# Patient Record
Sex: Female | Born: 1945 | Race: White | Hispanic: No | Smoking: Former smoker
Health system: Southern US, Community
[De-identification: ages and names within clinical notes are randomized; demographics above are authoritative.]

## PROBLEM LIST (undated history)

## (undated) DIAGNOSIS — R9439 Abnormal result of other cardiovascular function study: Secondary | ICD-10-CM

## (undated) DIAGNOSIS — F430 Acute stress reaction: Secondary | ICD-10-CM

## (undated) DIAGNOSIS — R112 Nausea with vomiting, unspecified: Secondary | ICD-10-CM

## (undated) DIAGNOSIS — E669 Obesity, unspecified: Secondary | ICD-10-CM

## (undated) DIAGNOSIS — R51 Headache: Secondary | ICD-10-CM

## (undated) DIAGNOSIS — M810 Age-related osteoporosis without current pathological fracture: Secondary | ICD-10-CM

## (undated) DIAGNOSIS — G43909 Migraine, unspecified, not intractable, without status migrainosus: Secondary | ICD-10-CM

## (undated) DIAGNOSIS — I209 Angina pectoris, unspecified: Secondary | ICD-10-CM

## (undated) DIAGNOSIS — E039 Hypothyroidism, unspecified: Secondary | ICD-10-CM

## (undated) DIAGNOSIS — N8110 Cystocele, unspecified: Secondary | ICD-10-CM

## (undated) DIAGNOSIS — I739 Peripheral vascular disease, unspecified: Secondary | ICD-10-CM

## (undated) DIAGNOSIS — I259 Chronic ischemic heart disease, unspecified: Secondary | ICD-10-CM

## (undated) DIAGNOSIS — D649 Anemia, unspecified: Secondary | ICD-10-CM

## (undated) DIAGNOSIS — J189 Pneumonia, unspecified organism: Secondary | ICD-10-CM

## (undated) DIAGNOSIS — T7840XA Allergy, unspecified, initial encounter: Secondary | ICD-10-CM

## (undated) DIAGNOSIS — F410 Panic disorder [episodic paroxysmal anxiety] without agoraphobia: Secondary | ICD-10-CM

## (undated) DIAGNOSIS — F32A Depression, unspecified: Secondary | ICD-10-CM

## (undated) DIAGNOSIS — M5126 Other intervertebral disc displacement, lumbar region: Secondary | ICD-10-CM

## (undated) DIAGNOSIS — I2109 ST elevation (STEMI) myocardial infarction involving other coronary artery of anterior wall: Secondary | ICD-10-CM

## (undated) DIAGNOSIS — E785 Hyperlipidemia, unspecified: Secondary | ICD-10-CM

## (undated) DIAGNOSIS — J449 Chronic obstructive pulmonary disease, unspecified: Secondary | ICD-10-CM

## (undated) DIAGNOSIS — Z8739 Personal history of other diseases of the musculoskeletal system and connective tissue: Secondary | ICD-10-CM

## (undated) DIAGNOSIS — E119 Type 2 diabetes mellitus without complications: Secondary | ICD-10-CM

## (undated) DIAGNOSIS — I2510 Atherosclerotic heart disease of native coronary artery without angina pectoris: Secondary | ICD-10-CM

## (undated) DIAGNOSIS — N200 Calculus of kidney: Secondary | ICD-10-CM

## (undated) DIAGNOSIS — N3941 Urge incontinence: Secondary | ICD-10-CM

## (undated) DIAGNOSIS — Z8639 Personal history of other endocrine, nutritional and metabolic disease: Secondary | ICD-10-CM

## (undated) DIAGNOSIS — I10 Essential (primary) hypertension: Secondary | ICD-10-CM

## (undated) DIAGNOSIS — Z9289 Personal history of other medical treatment: Secondary | ICD-10-CM

## (undated) DIAGNOSIS — R519 Headache, unspecified: Secondary | ICD-10-CM

## (undated) DIAGNOSIS — M1990 Unspecified osteoarthritis, unspecified site: Secondary | ICD-10-CM

## (undated) DIAGNOSIS — Z9889 Other specified postprocedural states: Secondary | ICD-10-CM

## (undated) DIAGNOSIS — F419 Anxiety disorder, unspecified: Secondary | ICD-10-CM

## (undated) DIAGNOSIS — H269 Unspecified cataract: Secondary | ICD-10-CM

## (undated) DIAGNOSIS — K219 Gastro-esophageal reflux disease without esophagitis: Secondary | ICD-10-CM

## (undated) DIAGNOSIS — F329 Major depressive disorder, single episode, unspecified: Secondary | ICD-10-CM

## (undated) DIAGNOSIS — Z8489 Family history of other specified conditions: Secondary | ICD-10-CM

## (undated) DIAGNOSIS — F439 Reaction to severe stress, unspecified: Secondary | ICD-10-CM

## (undated) DIAGNOSIS — M797 Fibromyalgia: Secondary | ICD-10-CM

## (undated) DIAGNOSIS — N201 Calculus of ureter: Secondary | ICD-10-CM

## (undated) HISTORY — DX: Age-related osteoporosis without current pathological fracture: M81.0

## (undated) HISTORY — DX: Major depressive disorder, single episode, unspecified: F32.9

## (undated) HISTORY — DX: Calculus of kidney: N20.0

## (undated) HISTORY — DX: Other intervertebral disc displacement, lumbar region: M51.26

## (undated) HISTORY — PX: DILATION AND CURETTAGE OF UTERUS: SHX78

## (undated) HISTORY — DX: Allergy, unspecified, initial encounter: T78.40XA

## (undated) HISTORY — DX: Hypothyroidism, unspecified: E03.9

## (undated) HISTORY — DX: Anemia, unspecified: D64.9

## (undated) HISTORY — PX: CARDIAC CATHETERIZATION: SHX172

## (undated) HISTORY — DX: Unspecified cataract: H26.9

## (undated) HISTORY — PX: FACIAL COSMETIC SURGERY: SHX629

## (undated) HISTORY — DX: Obesity, unspecified: E66.9

## (undated) HISTORY — DX: Anxiety disorder, unspecified: F41.9

## (undated) HISTORY — DX: Depression, unspecified: F32.A

## (undated) HISTORY — DX: Chronic ischemic heart disease, unspecified: I25.9

## (undated) HISTORY — DX: Reaction to severe stress, unspecified: F43.9

## (undated) HISTORY — DX: Hyperlipidemia, unspecified: E78.5

## (undated) HISTORY — PX: OTHER SURGICAL HISTORY: SHX169

---
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
## 1982-12-28 HISTORY — PX: CORONARY ANGIOPLASTY: SHX604

## 1983-02-26 DIAGNOSIS — I2109 ST elevation (STEMI) myocardial infarction involving other coronary artery of anterior wall: Secondary | ICD-10-CM

## 1983-02-26 HISTORY — DX: ST elevation (STEMI) myocardial infarction involving other coronary artery of anterior wall: I21.09

## 1998-07-24 ENCOUNTER — Ambulatory Visit (HOSPITAL_COMMUNITY): Admission: RE | Admit: 1998-07-24 | Discharge: 1998-07-24 | Payer: Self-pay | Admitting: Cardiology

## 1998-08-02 ENCOUNTER — Ambulatory Visit (HOSPITAL_COMMUNITY): Admission: RE | Admit: 1998-08-02 | Discharge: 1998-08-02 | Payer: Self-pay | Admitting: Cardiology

## 1999-12-04 ENCOUNTER — Encounter: Payer: Self-pay | Admitting: Cardiology

## 1999-12-04 ENCOUNTER — Encounter: Admission: RE | Admit: 1999-12-04 | Discharge: 1999-12-04 | Payer: Self-pay | Admitting: Cardiology

## 2001-01-19 ENCOUNTER — Encounter: Payer: Self-pay | Admitting: Obstetrics and Gynecology

## 2001-01-19 ENCOUNTER — Encounter: Admission: RE | Admit: 2001-01-19 | Discharge: 2001-01-19 | Payer: Self-pay | Admitting: Obstetrics and Gynecology

## 2001-02-01 ENCOUNTER — Other Ambulatory Visit: Admission: RE | Admit: 2001-02-01 | Discharge: 2001-02-01 | Payer: Self-pay | Admitting: Obstetrics and Gynecology

## 2001-03-09 ENCOUNTER — Encounter (INDEPENDENT_AMBULATORY_CARE_PROVIDER_SITE_OTHER): Payer: Self-pay | Admitting: Specialist

## 2001-03-09 ENCOUNTER — Other Ambulatory Visit: Admission: RE | Admit: 2001-03-09 | Discharge: 2001-03-09 | Payer: Self-pay | Admitting: Internal Medicine

## 2001-10-17 ENCOUNTER — Encounter: Payer: Self-pay | Admitting: Cardiology

## 2001-10-17 ENCOUNTER — Encounter: Admission: RE | Admit: 2001-10-17 | Discharge: 2001-10-17 | Payer: Self-pay | Admitting: Cardiology

## 2002-01-30 ENCOUNTER — Encounter: Payer: Self-pay | Admitting: Obstetrics and Gynecology

## 2002-01-30 ENCOUNTER — Encounter: Admission: RE | Admit: 2002-01-30 | Discharge: 2002-01-30 | Payer: Self-pay | Admitting: Obstetrics and Gynecology

## 2002-03-06 ENCOUNTER — Other Ambulatory Visit: Admission: RE | Admit: 2002-03-06 | Discharge: 2002-03-06 | Payer: Self-pay | Admitting: Obstetrics and Gynecology

## 2003-02-06 ENCOUNTER — Encounter: Payer: Self-pay | Admitting: Obstetrics and Gynecology

## 2003-02-06 ENCOUNTER — Encounter: Admission: RE | Admit: 2003-02-06 | Discharge: 2003-02-06 | Payer: Self-pay | Admitting: Obstetrics and Gynecology

## 2003-04-02 ENCOUNTER — Other Ambulatory Visit: Admission: RE | Admit: 2003-04-02 | Discharge: 2003-04-02 | Payer: Self-pay | Admitting: Obstetrics and Gynecology

## 2003-12-29 HISTORY — PX: CARPAL TUNNEL RELEASE: SHX101

## 2004-03-24 ENCOUNTER — Encounter: Admission: RE | Admit: 2004-03-24 | Discharge: 2004-03-24 | Payer: Self-pay | Admitting: Obstetrics and Gynecology

## 2004-04-21 ENCOUNTER — Other Ambulatory Visit: Admission: RE | Admit: 2004-04-21 | Discharge: 2004-04-21 | Payer: Self-pay | Admitting: Obstetrics and Gynecology

## 2004-04-22 ENCOUNTER — Encounter: Admission: RE | Admit: 2004-04-22 | Discharge: 2004-04-22 | Payer: Self-pay | Admitting: Obstetrics and Gynecology

## 2004-05-07 ENCOUNTER — Inpatient Hospital Stay (HOSPITAL_COMMUNITY): Admission: EM | Admit: 2004-05-07 | Discharge: 2004-05-08 | Payer: Self-pay | Admitting: Emergency Medicine

## 2005-05-28 ENCOUNTER — Ambulatory Visit (HOSPITAL_COMMUNITY): Admission: RE | Admit: 2005-05-28 | Discharge: 2005-05-28 | Payer: Self-pay | Admitting: Urology

## 2005-07-27 ENCOUNTER — Ambulatory Visit (HOSPITAL_COMMUNITY): Admission: RE | Admit: 2005-07-27 | Discharge: 2005-07-27 | Payer: Self-pay | Admitting: Urology

## 2005-11-20 ENCOUNTER — Encounter: Admission: RE | Admit: 2005-11-20 | Discharge: 2005-11-20 | Payer: Self-pay | Admitting: Obstetrics and Gynecology

## 2005-12-28 HISTORY — PX: EXTRACORPOREAL SHOCK WAVE LITHOTRIPSY: SHX1557

## 2006-05-17 ENCOUNTER — Ambulatory Visit: Payer: Self-pay | Admitting: Internal Medicine

## 2006-05-21 ENCOUNTER — Other Ambulatory Visit: Admission: RE | Admit: 2006-05-21 | Discharge: 2006-05-21 | Payer: Self-pay | Admitting: Obstetrics and Gynecology

## 2006-06-04 ENCOUNTER — Encounter: Admission: RE | Admit: 2006-06-04 | Discharge: 2006-06-04 | Payer: Self-pay | Admitting: Obstetrics and Gynecology

## 2006-06-23 ENCOUNTER — Ambulatory Visit: Payer: Self-pay | Admitting: Internal Medicine

## 2006-11-23 ENCOUNTER — Encounter: Admission: RE | Admit: 2006-11-23 | Discharge: 2006-11-23 | Payer: Self-pay | Admitting: Obstetrics and Gynecology

## 2007-01-17 ENCOUNTER — Encounter: Admission: RE | Admit: 2007-01-17 | Discharge: 2007-01-17 | Payer: Self-pay | Admitting: Cardiology

## 2007-05-20 ENCOUNTER — Encounter: Admission: RE | Admit: 2007-05-20 | Discharge: 2007-05-20 | Payer: Self-pay | Admitting: Endocrinology

## 2007-05-20 DIAGNOSIS — Z9289 Personal history of other medical treatment: Secondary | ICD-10-CM

## 2007-05-20 HISTORY — DX: Personal history of other medical treatment: Z92.89

## 2007-11-15 ENCOUNTER — Emergency Department (HOSPITAL_COMMUNITY): Admission: EM | Admit: 2007-11-15 | Discharge: 2007-11-16 | Payer: Self-pay | Admitting: Emergency Medicine

## 2007-11-17 ENCOUNTER — Ambulatory Visit (HOSPITAL_COMMUNITY): Admission: RE | Admit: 2007-11-17 | Discharge: 2007-11-17 | Payer: Self-pay | Admitting: Urology

## 2007-12-08 ENCOUNTER — Encounter: Admission: RE | Admit: 2007-12-08 | Discharge: 2007-12-08 | Payer: Self-pay | Admitting: Obstetrics and Gynecology

## 2008-02-09 ENCOUNTER — Encounter: Admission: RE | Admit: 2008-02-09 | Discharge: 2008-02-09 | Payer: Self-pay | Admitting: Internal Medicine

## 2008-04-18 ENCOUNTER — Inpatient Hospital Stay (HOSPITAL_BASED_OUTPATIENT_CLINIC_OR_DEPARTMENT_OTHER): Admission: RE | Admit: 2008-04-18 | Discharge: 2008-04-18 | Payer: Self-pay | Admitting: Cardiology

## 2008-06-30 ENCOUNTER — Emergency Department (HOSPITAL_COMMUNITY): Admission: EM | Admit: 2008-06-30 | Discharge: 2008-07-01 | Payer: Self-pay | Admitting: Emergency Medicine

## 2008-07-04 ENCOUNTER — Ambulatory Visit (HOSPITAL_BASED_OUTPATIENT_CLINIC_OR_DEPARTMENT_OTHER): Admission: RE | Admit: 2008-07-04 | Discharge: 2008-07-04 | Payer: Self-pay | Admitting: Urology

## 2008-07-04 HISTORY — PX: OTHER SURGICAL HISTORY: SHX169

## 2008-08-24 ENCOUNTER — Encounter: Admission: RE | Admit: 2008-08-24 | Discharge: 2008-08-24 | Payer: Self-pay | Admitting: Obstetrics and Gynecology

## 2008-12-12 ENCOUNTER — Inpatient Hospital Stay (HOSPITAL_COMMUNITY): Admission: AD | Admit: 2008-12-12 | Discharge: 2008-12-13 | Payer: Self-pay | Admitting: Cardiology

## 2009-02-13 ENCOUNTER — Encounter: Admission: RE | Admit: 2009-02-13 | Discharge: 2009-02-13 | Payer: Self-pay | Admitting: Obstetrics and Gynecology

## 2009-12-05 ENCOUNTER — Encounter: Admission: RE | Admit: 2009-12-05 | Discharge: 2009-12-05 | Payer: Self-pay | Admitting: Internal Medicine

## 2009-12-16 ENCOUNTER — Ambulatory Visit (HOSPITAL_COMMUNITY): Admission: RE | Admit: 2009-12-16 | Discharge: 2009-12-16 | Payer: Self-pay | Admitting: Urology

## 2010-01-01 IMAGING — CR DG CHEST 2V
2 series · 2 of 2 positions shown · non-contrast
Comparison: 07/24/2005

CLINICAL DATA: Unstable angina

CHEST - 2 VIEW

[w chest pa]
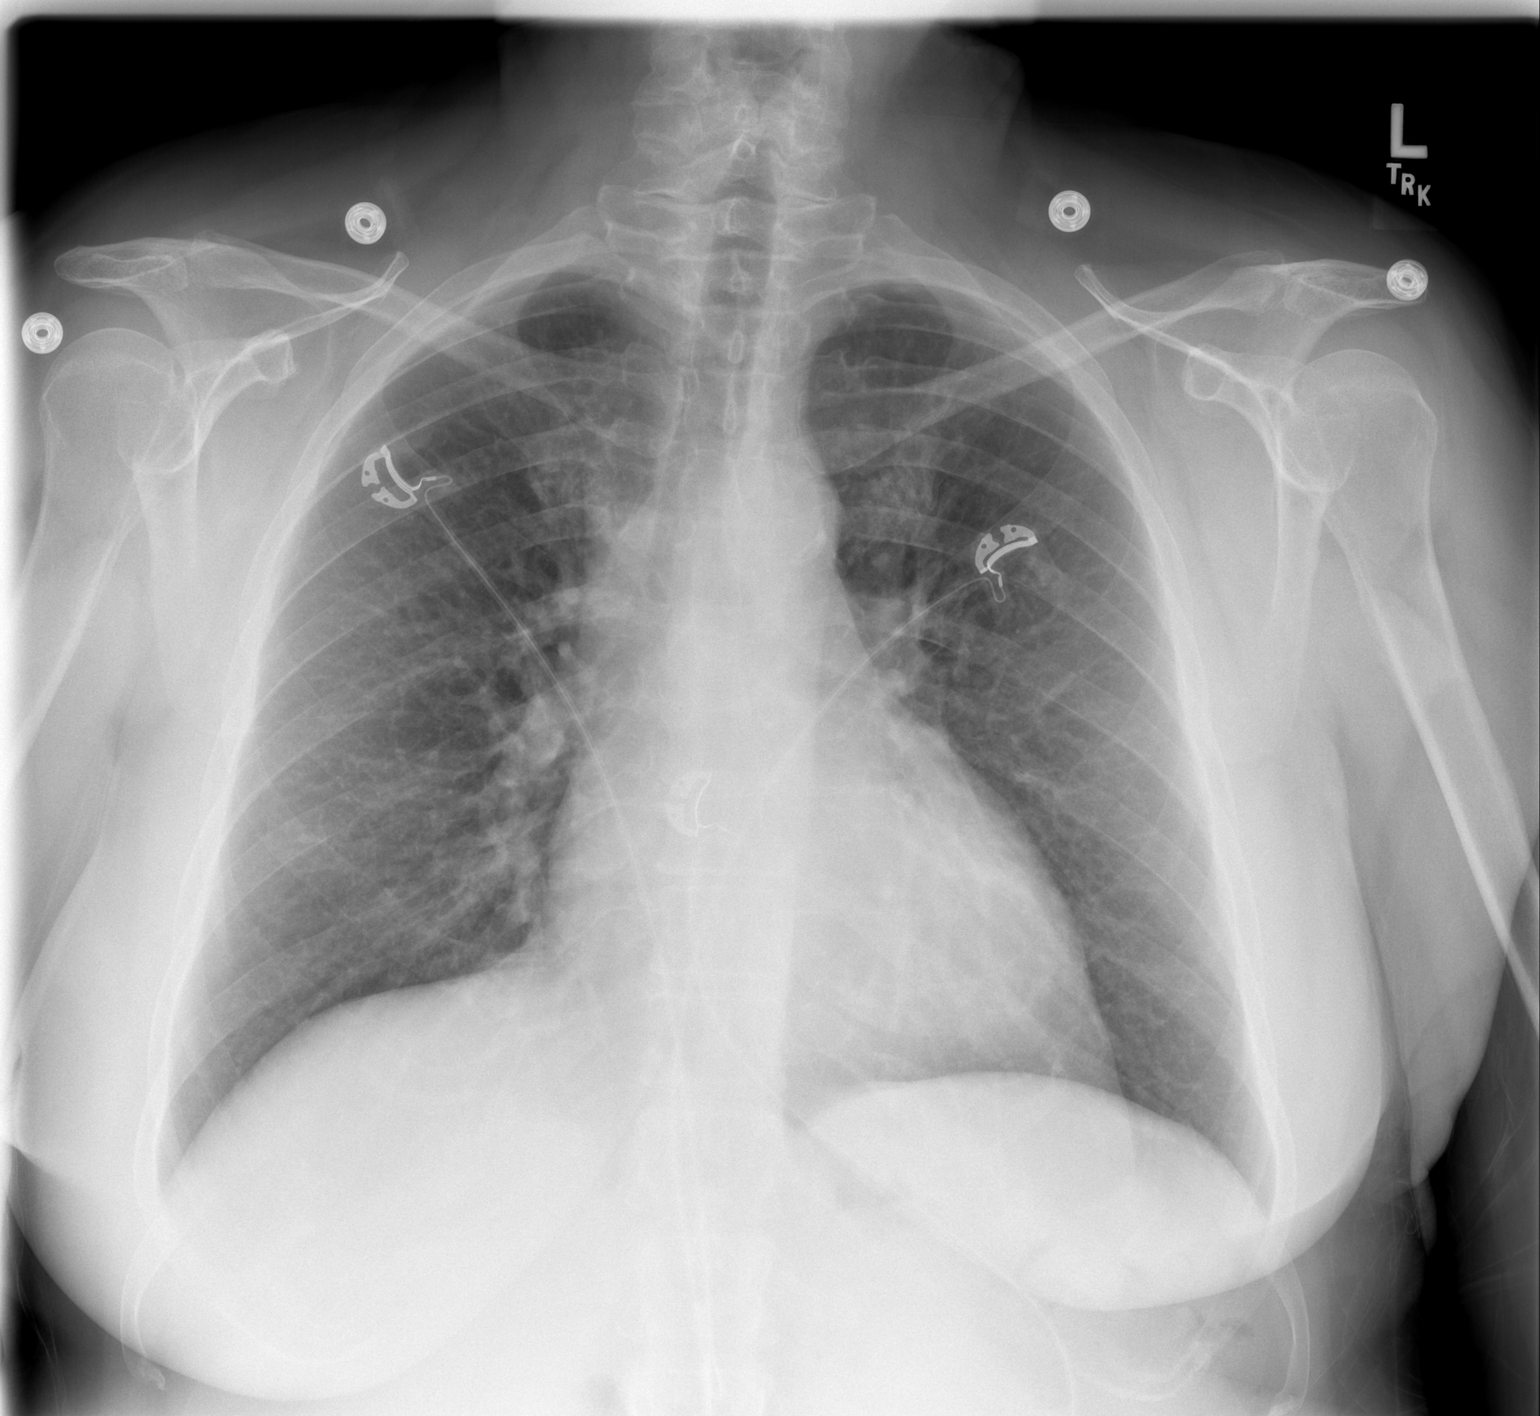

[w chest lat]
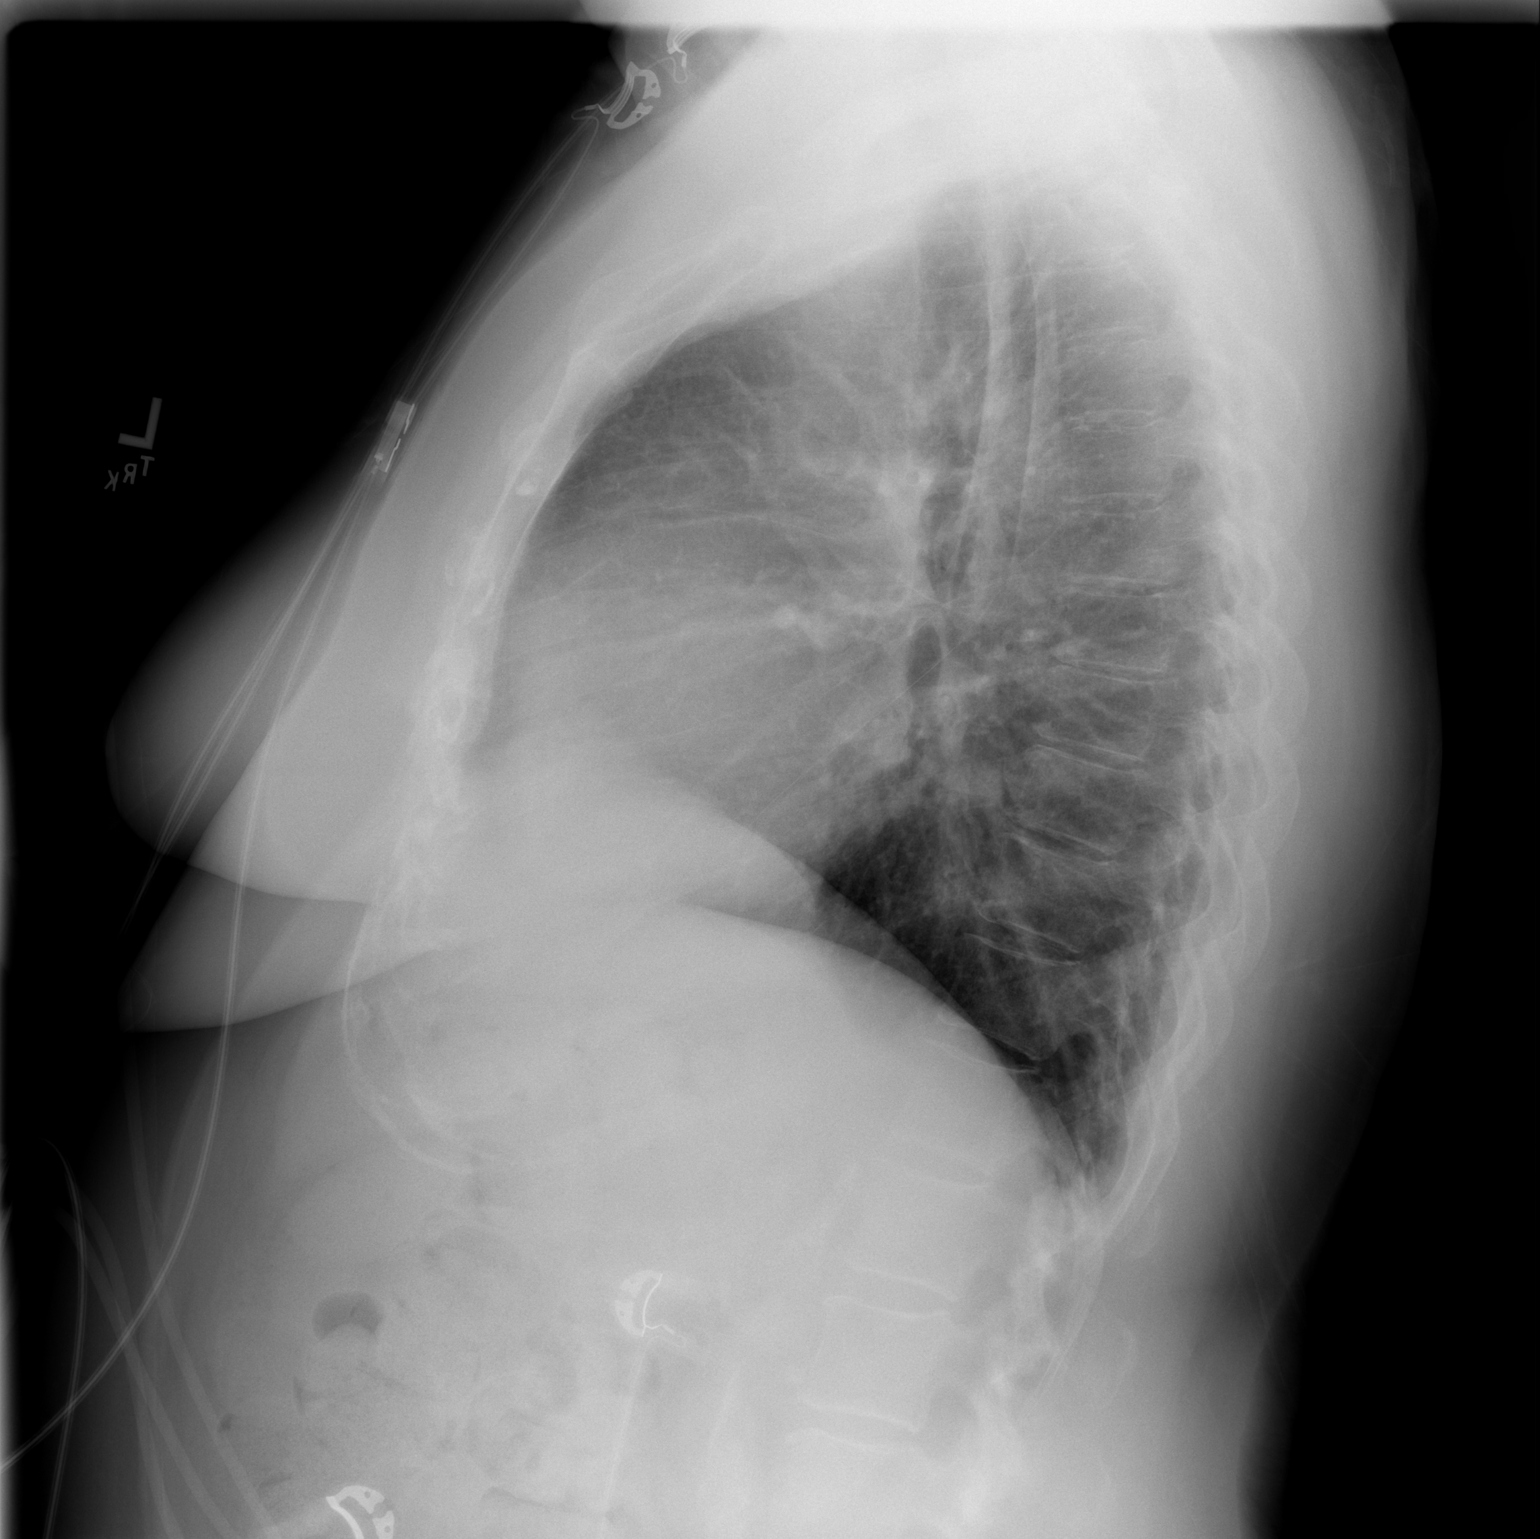

[2 of 2 positions shown; findings below may reference images not displayed]

FINDINGS: Heart size upper normal.  No congestive heart failure or
active disease.  Mild peribronchial thickening with slightly
coarsened markings as before.

Osseous structures intact.  No pleural fluid.
IMPRESSION: 1.  Heart size upper normal.
2.  Bronchitic changes.
3.  No congestive heart failure or active disease.

## 2010-03-21 ENCOUNTER — Encounter: Admission: RE | Admit: 2010-03-21 | Discharge: 2010-03-21 | Payer: Self-pay | Admitting: Obstetrics and Gynecology

## 2010-05-24 ENCOUNTER — Emergency Department (HOSPITAL_COMMUNITY): Admission: EM | Admit: 2010-05-24 | Discharge: 2010-05-24 | Payer: Self-pay | Admitting: Emergency Medicine

## 2010-12-08 ENCOUNTER — Ambulatory Visit: Payer: Self-pay | Admitting: Cardiology

## 2011-01-09 ENCOUNTER — Encounter
Admission: RE | Admit: 2011-01-09 | Discharge: 2011-01-09 | Payer: Self-pay | Source: Home / Self Care | Attending: Neurological Surgery | Admitting: Neurological Surgery

## 2011-01-13 ENCOUNTER — Ambulatory Visit: Payer: Self-pay | Admitting: Cardiology

## 2011-01-19 ENCOUNTER — Other Ambulatory Visit: Payer: Self-pay | Admitting: Obstetrics and Gynecology

## 2011-01-19 DIAGNOSIS — Z1239 Encounter for other screening for malignant neoplasm of breast: Secondary | ICD-10-CM

## 2011-03-09 ENCOUNTER — Encounter: Payer: Self-pay | Admitting: Cardiology

## 2011-03-09 DIAGNOSIS — I252 Old myocardial infarction: Secondary | ICD-10-CM | POA: Insufficient documentation

## 2011-03-09 DIAGNOSIS — F419 Anxiety disorder, unspecified: Secondary | ICD-10-CM | POA: Insufficient documentation

## 2011-03-09 DIAGNOSIS — E669 Obesity, unspecified: Secondary | ICD-10-CM | POA: Insufficient documentation

## 2011-03-09 DIAGNOSIS — E039 Hypothyroidism, unspecified: Secondary | ICD-10-CM | POA: Insufficient documentation

## 2011-03-09 DIAGNOSIS — I259 Chronic ischemic heart disease, unspecified: Secondary | ICD-10-CM | POA: Insufficient documentation

## 2011-03-09 DIAGNOSIS — M5126 Other intervertebral disc displacement, lumbar region: Secondary | ICD-10-CM | POA: Insufficient documentation

## 2011-03-09 DIAGNOSIS — F439 Reaction to severe stress, unspecified: Secondary | ICD-10-CM | POA: Insufficient documentation

## 2011-03-09 DIAGNOSIS — N200 Calculus of kidney: Secondary | ICD-10-CM | POA: Insufficient documentation

## 2011-03-09 DIAGNOSIS — Z8709 Personal history of other diseases of the respiratory system: Secondary | ICD-10-CM | POA: Insufficient documentation

## 2011-03-09 DIAGNOSIS — Z8701 Personal history of pneumonia (recurrent): Secondary | ICD-10-CM | POA: Insufficient documentation

## 2011-03-09 DIAGNOSIS — F329 Major depressive disorder, single episode, unspecified: Secondary | ICD-10-CM | POA: Insufficient documentation

## 2011-03-09 DIAGNOSIS — E1142 Type 2 diabetes mellitus with diabetic polyneuropathy: Secondary | ICD-10-CM | POA: Insufficient documentation

## 2011-03-16 LAB — DIFFERENTIAL
Basophils Absolute: 0.1 10*3/uL (ref 0.0–0.1)
Basophils Relative: 1 % (ref 0–1)
Lymphocytes Relative: 30 % (ref 12–46)
Neutro Abs: 6.4 10*3/uL (ref 1.7–7.7)
Neutrophils Relative %: 57 % (ref 43–77)

## 2011-03-16 LAB — URINE MICROSCOPIC-ADD ON

## 2011-03-16 LAB — BASIC METABOLIC PANEL
CO2: 27 mEq/L (ref 19–32)
Calcium: 10.3 mg/dL (ref 8.4–10.5)
Creatinine, Ser: 1.14 mg/dL (ref 0.4–1.2)
GFR calc Af Amer: 58 mL/min — ABNORMAL LOW (ref >60)
GFR calc non Af Amer: 48 mL/min — ABNORMAL LOW (ref >60)
Glucose, Bld: 99 mg/dL (ref 70–99)
Sodium: 137 mEq/L (ref 135–145)

## 2011-03-16 LAB — URINALYSIS, ROUTINE W REFLEX MICROSCOPIC
Nitrite: POSITIVE — AB
Specific Gravity, Urine: 1.012 (ref 1.005–1.030)
Urobilinogen, UA: 2 mg/dL — ABNORMAL HIGH (ref 0.0–1.0)
pH: 5 (ref 5.0–8.0)

## 2011-03-16 LAB — CBC
Hemoglobin: 12.5 g/dL (ref 12.0–15.0)
MCHC: 33.3 g/dL (ref 30.0–36.0)
RBC: 4.16 MIL/uL (ref 3.87–5.11)
RDW: 13.4 % (ref 11.5–15.5)

## 2011-03-16 LAB — URINE CULTURE

## 2011-03-24 ENCOUNTER — Ambulatory Visit
Admission: RE | Admit: 2011-03-24 | Discharge: 2011-03-24 | Disposition: A | Discharge status: Home / Self Care | Payer: BC Managed Care – PPO | Source: Ambulatory Visit | Attending: Obstetrics and Gynecology | Admitting: Obstetrics and Gynecology

## 2011-03-24 DIAGNOSIS — Z1239 Encounter for other screening for malignant neoplasm of breast: Secondary | ICD-10-CM

## 2011-03-30 LAB — GLUCOSE, CAPILLARY
Glucose-Capillary: 104 mg/dL — ABNORMAL HIGH (ref 70–99)
Glucose-Capillary: 95 mg/dL (ref 70–99)

## 2011-03-30 LAB — COMPREHENSIVE METABOLIC PANEL
ALT: 23 U/L (ref 0–35)
AST: 20 U/L (ref 0–37)
Albumin: 3.8 g/dL (ref 3.5–5.2)
Alkaline Phosphatase: 91 U/L (ref 39–117)
BUN: 12 mg/dL (ref 6–23)
CO2: 28 mEq/L (ref 19–32)
Calcium: 10.3 mg/dL (ref 8.4–10.5)
Chloride: 102 mEq/L (ref 96–112)
Creatinine, Ser: 0.88 mg/dL (ref 0.4–1.2)
GFR calc Af Amer: >60 mL/min (ref >60)
GFR calc non Af Amer: >60 mL/min (ref >60)
Glucose, Bld: 115 mg/dL — ABNORMAL HIGH (ref 70–99)
Potassium: 4.4 mEq/L (ref 3.5–5.1)
Sodium: 138 mEq/L (ref 135–145)
Total Bilirubin: 0.2 mg/dL — ABNORMAL LOW (ref 0.3–1.2)
Total Protein: 7 g/dL (ref 6.0–8.3)

## 2011-03-31 ENCOUNTER — Encounter: Payer: Self-pay | Admitting: Cardiology

## 2011-03-31 ENCOUNTER — Ambulatory Visit (INDEPENDENT_AMBULATORY_CARE_PROVIDER_SITE_OTHER): Payer: Medicare Other | Admitting: Cardiology

## 2011-03-31 ENCOUNTER — Ambulatory Visit: Payer: Self-pay | Admitting: Cardiology

## 2011-03-31 VITALS — BP 110/70 | HR 76 | Wt 184.0 lb

## 2011-03-31 DIAGNOSIS — E785 Hyperlipidemia, unspecified: Secondary | ICD-10-CM

## 2011-03-31 DIAGNOSIS — I259 Chronic ischemic heart disease, unspecified: Secondary | ICD-10-CM

## 2011-03-31 DIAGNOSIS — E7800 Pure hypercholesterolemia, unspecified: Secondary | ICD-10-CM

## 2011-03-31 DIAGNOSIS — E669 Obesity, unspecified: Secondary | ICD-10-CM

## 2011-03-31 DIAGNOSIS — I2510 Atherosclerotic heart disease of native coronary artery without angina pectoris: Secondary | ICD-10-CM

## 2011-04-01 ENCOUNTER — Telehealth: Payer: Self-pay | Admitting: Cardiology

## 2011-04-01 ENCOUNTER — Encounter: Payer: Self-pay | Admitting: Cardiology

## 2011-04-01 LAB — HEPATIC FUNCTION PANEL
AST: 21 U/L (ref 0–37)
Total Bilirubin: 0.7 mg/dL (ref 0.3–1.2)

## 2011-04-01 LAB — BASIC METABOLIC PANEL
BUN: 16 mg/dL (ref 6–23)
Creatinine, Ser: 0.8 mg/dL (ref 0.4–1.2)
GFR: 79.67 mL/min (ref >60.00)
Potassium: 4.6 mEq/L (ref 3.5–5.1)

## 2011-04-01 LAB — LIPID PANEL
Cholesterol: 140 mg/dL (ref 0–200)
LDL Cholesterol: 81 mg/dL (ref 0–99)
Total CHOL/HDL Ratio: 3
VLDL: 18.6 mg/dL (ref 0.0–40.0)

## 2011-04-01 NOTE — Assessment & Plan Note (Signed)
She has lost 11 pounds with a combination of exercise and diet.

## 2011-04-01 NOTE — Assessment & Plan Note (Signed)
LC levels were 11.4 in January. We've referred to Dr. Clelia Croft for followup. She had a normal PTH in June of 2011 and he has been following this issue.

## 2011-04-01 NOTE — Assessment & Plan Note (Addendum)
She's continued to do well from a cardiac standpoint. 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 She does have chronic anxieties but overall, has done well for 28 years with her coronary disease. I will have her see Dr. Olga Millers in followup.her last stress Cardiolite study was in April 2009 and we will try to arrange for another study before I retire. He tried to do stress Myoview is every 2 years to monitor her risk stratification.

## 2011-04-01 NOTE — Telephone Encounter (Signed)
[phone] OPT 2  PEER TO PEER  BY April 6 4PM CENTRAL  NEEDS MD OR LORI  ACCULUMTIVE  EFFECT PATIENT EXPOSURE  PATIENT MsV

## 2011-04-01 NOTE — Assessment & Plan Note (Signed)
She sees Dr. Clelia Croft for primary care here cholesterol levels have been satisfactory

## 2011-04-01 NOTE — Progress Notes (Signed)
Subjective:   [name] [name] is seen today for a followup visit. 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 With thrombolytics and angioplasty. Continue to manage her over the years with recurrent episodes of chest pain and overall, she's been doing well for several years. Her last cardiac catheterization was in April of 2009 which showed mild anterior hypokinesis. She had mild coronary atherosclerosis with a 30-40% narrowing in the mid LAD and 50% narrowing in a small obtuse marginal. She had normal renal arteries. Her right coronary artery was nondominant. She is continued to do well.  Current Outpatient Prescriptions  Medication Sig Dispense Refill  . ALPRAZolam (XANAX) 0.5 MG tablet Take 1 mg by mouth. ONE TABLET IN AM, TWO TABLETS IN PM      . aspirin 81 MG tablet Take 81 mg by mouth daily.        Marland Kitchen atorvastatin (LIPITOR) 40 MG tablet Take 40 mg by mouth daily.        Marland Kitchen diltiazem (CARDIZEM CD) 120 MG 24 hr capsule Take 120 mg by mouth daily.        Marland Kitchen ezetimibe (ZETIA) 10 MG tablet Take 10 mg by mouth daily.        Marland Kitchen FLUoxetine (PROZAC) 20 MG capsule Take 20 mg by mouth daily.        . Fluticasone-Salmeterol (ADVAIR HFA IN) Inhale into the lungs as needed.        Marland Kitchen levothyroxine (SYNTHROID, LEVOTHROID) 150 MCG tablet Take 150 mcg by mouth daily.        Marland Kitchen losartan (COZAAR) 50 MG tablet Take 50 mg by mouth daily.        . metFORMIN (GLUCOPHAGE) 1000 MG tablet Take 1,000 mg by mouth 2 (two) times daily.        . metoprolol succinate (TOPROL-XL) 25 MG 24 hr tablet Take 25 mg by mouth daily.        . nitroGLYCERIN (NITROSTAT) 0.4 MG SL tablet Place 0.4 mg under the tongue every 5 (five) minutes as needed.        Marland Kitchen omeprazole (PRILOSEC) 20 MG capsule Take 20 mg by mouth as needed.        Marland Kitchen oxyCODONE-acetaminophen (PERCOCET) 5-325 MG per tablet Take 1.5 tablets by mouth 4 (four) times daily.          No Known Allergies  Patient Active Problem List  Diagnoses  . Ischemic heart  disease  . Diabetes mellitus  . Obesity  . Chronic anxiety  . Nephrolithiasis  . Lumbar herniated disc  . Depression  . Situational stress  . Hyperlipemia  . Hypothyroidism  . History of bronchitis  . History of pneumonia  . History of myocardial infarction    History  Smoking status  . Former Smoker  . Types: Cigarettes  . Quit date: 12/28/1982  Smokeless tobacco  . Never Used    History  Alcohol Use: Not on file    Family History  Problem Relation Age of Onset  . Heart disease Mother   . Heart attack Father   . Heart disease Brother     Review of Systems:   The patient denies any heat or cold intolerance.  No weight gain or weight loss.  The patient denies headaches or blurry vision.  There is no cough or sputum production.  The patient denies dizziness.  There is no hematuria or hematochezia.  The patient denies any muscle aches or arthritis.  The patient denies any rash.  The patient denies frequent falling or instability.  There is no history of depression or anxiety.  All other systems were reviewed and are negative.   Physical Exam:   Weight is 184 which is down 11 pounds. Blood pressure is 118/70 sitting, 110/70 standing, heart rate is 76.The head is normocephalic and atraumatic.  Pupils are equally round and reactive to light.  Sclerae nonicteric.  Conjunctiva is clear.  Oropharynx is unremarkable.  There's adequate oral airway.  Neck is supple there are no masses.  Thyroid is not enlarged.  There is no lymphadenopathy.  Lungs are clear.  Chest is symmetric.  Heart shows a regular rate and rhythm.  S1 and S2 are normal.  There is no murmur click or gallop.  Abdomen is soft normal bowel sounds.  There is no organomegaly.  Genital and rectal deferred.  Extremities are without edema.  Peripheral pulses are adequate.  Neurologically intact.  Full range of motion.  The patient is not depressed.  Skin is warm and dry.  Assessment / Plan:

## 2011-04-02 ENCOUNTER — Telehealth: Payer: Self-pay | Admitting: *Deleted

## 2011-04-02 NOTE — Telephone Encounter (Signed)
Pt notified of lab results and to continue same medications.  Pt will f/u with Dr. Jens Som in six months with labs.

## 2011-04-02 NOTE — Telephone Encounter (Signed)
Message copied by Barnetta Hammersmith on Thu Apr 02, 2011 10:14 AM ------      Message from: Norma Fredrickson      Created: Wed Apr 01, 2011  8:55 AM       Ok to report. Labs are satisfactory.  Stay on same medicines. Recheck BMET/HPF/LIPIDS  in 6 months.

## 2011-04-09 ENCOUNTER — Ambulatory Visit (HOSPITAL_COMMUNITY): Payer: Medicare Other | Attending: Cardiology | Admitting: Radiology

## 2011-04-09 VITALS — Ht 68.25 in | Wt 184.0 lb

## 2011-04-09 DIAGNOSIS — I4949 Other premature depolarization: Secondary | ICD-10-CM

## 2011-04-09 DIAGNOSIS — E7800 Pure hypercholesterolemia, unspecified: Secondary | ICD-10-CM | POA: Insufficient documentation

## 2011-04-09 DIAGNOSIS — R9439 Abnormal result of other cardiovascular function study: Secondary | ICD-10-CM

## 2011-04-09 DIAGNOSIS — R079 Chest pain, unspecified: Secondary | ICD-10-CM

## 2011-04-09 DIAGNOSIS — R0989 Other specified symptoms and signs involving the circulatory and respiratory systems: Secondary | ICD-10-CM

## 2011-04-09 HISTORY — DX: Abnormal result of other cardiovascular function study: R94.39

## 2011-04-09 MED ORDER — TECHNETIUM TC 99M TETROFOSMIN IV KIT
33.0000 | PACK | Freq: Once | INTRAVENOUS | Status: AC | PRN
  Administered 2011-04-09: 33 via INTRAVENOUS

## 2011-04-09 MED ORDER — TECHNETIUM TC 99M TETROFOSMIN IV KIT
11.0000 | PACK | Freq: Once | INTRAVENOUS | Status: AC | PRN
  Administered 2011-04-09: 11 via INTRAVENOUS

## 2011-04-09 MED ORDER — REGADENOSON 0.4 MG/5ML IV SOLN
0.4000 mg | Freq: Once | INTRAVENOUS | Status: AC
  Administered 2011-04-09: 0.4 mg via INTRAVENOUS

## 2011-04-09 NOTE — Progress Notes (Signed)
[facility] SITE 3 NUCLEAR MED [address] [place] [address] [phone]  Cardiology Nuclear Med Study  [name] [name] is a 65 y.o. female [id number] 1946-02-16   Nuclear Med Background Indication for Stress Test:  Evaluation for Ischemia History: '84 Angioplasty, 03/84 EF 50%Heart Catheterization, 04/09 Myocardial Infarction and 04/09 EF 61%Myocardial Perfusion Study Cardiac Risk Factors: Family History - CAD, History of Smoking, Lipids and NIDDM  Symptoms:  Chest Pain, DOE, Fatigue and Palpitations   Nuclear Pre-Procedure Caffeine/Decaff Intake:  None NPO After: 7:00am   Lungs:  clear IV 0.9% NS with Angio Cath:  18g  IV Site: R Antecubital  IV Started by:  Stanton Kidney, EMT-P  Chest Size (in):  40 Cup Size: C  Height: 5' 8.25" (1.734 m)  Weight:  184 lb (83.462 kg)  BMI:  Body mass index is 27.77 kg/(m^2). Tech Comments:  Toprol held > 15 hours, per patient. The patient was switched to a walking Lexiscan when she couldn't keep up with the treadmill.    Nuclear Med Study 1 or 2 day study: 1 day  Stress Test Type:  Treadmill/Lexiscan  Reading MD: [name], MD  Order Authorizing Provider:  S.Tennant  Resting Radionuclide: Technetium 80m Tetrofosmin  Resting Radionuclide Dose: 11.0 mCi   Stress Radionuclide:  Technetium 24m Tetrofosmin  Stress Radionuclide Dose: 33.0 mCi           Stress Protocol Rest HR: 62 Stress HR: 131  Rest BP: 127/74 Stress BP: 183/59  Exercise Time (min): 7:30 METS: 6.60   Predicted Max HR: 154 bpm % Max HR: 85.06 bpm Rate Pressure Product: 21308   Dose of Adenosine (mg):  n/a Dose of Lexiscan: 0.4 mg  Dose of Atropine (mg): n/a Dose of Dobutamine: n/a mcg/kg/min (at max HR)  Stress Test Technologist: Milana Na, EMT-P  Nuclear Technologist:  Doyne Keel, CNMT     Rest Procedure:  Myocardial perfusion imaging was performed at rest 45 minutes following the intravenous administration of Technetium 58m  Tetrofosmin. Rest ECG: Sinus Bradycardia  Stress Procedure:  The patient received IV Lexiscan 0.4 mg over 15-seconds with concurrent low level exercise and then Technetium 63m Tetrofosmin was injected at 30-seconds while the patient continued walking one more minute.  There were non specific changes and rare pvcs with Lexiscan.  Quantitative spect images were obtained after a 45-minute delay. Stress ECG: No significant change from baseline ECG  QPS Raw Data Images:  Normal; no motion artifact; normal heart/lung ratio. Stress Images:  Mid to apical anterior perfusion defect.  Rest Images:  Mid to apical anterior perfusion defect, more prominent than on stress images. Subtraction (SDS):  Mid to apical anterior perfusion defect, actually more pronounced at rest than with stress.  Transient Ischemic Dilatation (Normal <1.22):  1.07 Lung/Heart Ratio (Normal <0.45):  0.35  Quantitative Gated Spect Images QGS EDV:  79 ml QGS ESV:  29 ml QGS cine images:  The apex was hypokinetic.  QGS EF: 63%  Impression Exercise Capacity:  Lexiscan with low level exercise. BP Response:  Normal blood pressure response. Clinical Symptoms:  There is dyspnea. ECG Impression:  Artifact.  No significant ST changes.  Comparison with Prior Nuclear Study: No images to compare  Overall Impression:  Mid to apical anterior perfusion defect that is actually more pronounced at rest than with stress.  There is no ischemia.  Suspect this represents prior MI but pattern is unusual given worse defect at rest.  Apical hypokinesis but overall preserved  EF.   [name]

## 2011-04-10 ENCOUNTER — Telehealth: Payer: Self-pay | Admitting: Cardiology

## 2011-04-10 NOTE — Progress Notes (Signed)
ROUTED TO DR. TENNANT.[name] [name] ° °

## 2011-04-10 NOTE — Telephone Encounter (Signed)
Fax: [phone] Stress test, Office Note, EKG, Cath

## 2011-04-13 ENCOUNTER — Encounter: Payer: Self-pay | Admitting: Cardiology

## 2011-04-13 ENCOUNTER — Telehealth: Payer: Self-pay | Admitting: *Deleted

## 2011-04-13 NOTE — Telephone Encounter (Signed)
Pt calling regarding her stress test from last week;  Wanting the results called as soon as you get [phone]

## 2011-04-14 ENCOUNTER — Telehealth: Payer: Self-pay | Admitting: Cardiology

## 2011-04-14 NOTE — Telephone Encounter (Signed)
ASKING FOR LAST OV NOTE AND STRESS TEST WITH RESTING EKG TO BE FAXED TO: [phone]. PROCEDURE SCHEDULED FOR MAY 1.

## 2011-04-15 ENCOUNTER — Encounter: Payer: Self-pay | Admitting: Cardiology

## 2011-04-15 ENCOUNTER — Other Ambulatory Visit (INDEPENDENT_AMBULATORY_CARE_PROVIDER_SITE_OTHER): Payer: Medicare Other | Admitting: *Deleted

## 2011-04-15 DIAGNOSIS — Z79899 Other long term (current) drug therapy: Secondary | ICD-10-CM

## 2011-04-15 LAB — CBC WITH DIFFERENTIAL/PLATELET
Basophils Absolute: 0 10*3/uL (ref 0.0–0.1)
Eosinophils Absolute: 0.3 10*3/uL (ref 0.0–0.7)
HCT: 36.4 % (ref 36.0–46.0)
Hemoglobin: 12.2 g/dL (ref 12.0–15.0)
Lymphs Abs: 3 10*3/uL (ref 0.7–4.0)
MCHC: 33.7 g/dL (ref 30.0–36.0)
Monocytes Relative: 6.9 % (ref 3.0–12.0)
Neutro Abs: 6.4 10*3/uL (ref 1.4–7.7)
RDW: 14.4 % (ref 11.5–14.6)

## 2011-04-15 NOTE — Progress Notes (Signed)
[employer] Unchanged.

## 2011-04-15 NOTE — Progress Notes (Signed)
Pt notified of nuclear results.   

## 2011-04-20 ENCOUNTER — Telehealth: Payer: Self-pay | Admitting: *Deleted

## 2011-04-20 NOTE — Telephone Encounter (Signed)
Message copied by Barnetta Hammersmith on Mon Apr 20, 2011  8:59 AM ------      Message from: Roger Shelter      Created: Fri Apr 17, 2011  4:07 PM       Ok to report

## 2011-04-20 NOTE — Telephone Encounter (Signed)
Pt notified of lab results and a copy of lab's faxed to Dr. Derek Jack office.

## 2011-04-24 ENCOUNTER — Other Ambulatory Visit: Payer: Self-pay | Admitting: *Deleted

## 2011-04-24 NOTE — Telephone Encounter (Signed)
Pt sent RN fax regarding refills on meds.  RN called in to [facility] [address] for refills for one year on Fluoxetine 20 mg one po daily #30, Metoprolol Succinate 25 one po daily #30, Zetia 10 mg one po daily #30, Lipitor 40 mg one po daily #30, Omeprazole 20 mg one po daily #30, Diltiazem 120 mg one po daily #30, and Losartan 50 mg one po daily #30.

## 2011-04-30 ENCOUNTER — Other Ambulatory Visit: Payer: Self-pay | Admitting: Cardiology

## 2011-04-30 NOTE — Telephone Encounter (Signed)
Pt called She wants to get refill of all meds before Dr Cristobal Goldmann please call

## 2011-04-30 NOTE — Telephone Encounter (Signed)
RN called in Furosemide 20 mg one po daily PRN swelling #30 with 11 refills, Zetia 10 mg one po daily #30 with 11 refills, Losartan 25 mg one po daily #30 with 11 refills, Nitrostat 0.4 mg one tab SL PRN #25 with 11 refills, Xanax 0.5 mg one- two tablets po bid prn #100 with 5 refills per pt request.  Called into Pharmacist Melanie at [facility] [address].

## 2011-05-12 NOTE — Op Note (Signed)
NAME:  [name] [name], [name] [name]                ACCOUNT NO.:  [id number]   MEDICAL RECORD NO.:  [id number]          PATIENT TYPE:  AMB   LOCATION:  DAY                          FACILITY:  [facility]   PHYSICIAN:  Mark C. Vernie Ammons, M.D.  DATE OF BIRTH:  10-11-1946   DATE OF PROCEDURE:  11/17/2007  DATE OF DISCHARGE:                               OPERATIVE REPORT   PREOPERATIVE DIAGNOSIS:  Left ureteral calculi.   POSTOPERATIVE DIAGNOSIS:  Left ureteral calculi.   PROCEDURE:  Cystoscopy, left ureteroscopy, stone extraction and in situ  laser lithotripsy with a double-J stent placement.   SURGEON:  Mark C. Vernie Ammons, M.D.   ANESTHESIA:  General.   DRAINS:  5-French 26 cm Polaris stent (no string).   BLOOD LOSS:  Minimal.   COMPLICATIONS:  None.   INDICATIONS:  The patient is 65 year old white female with known  bilateral renal calculi who developed acute left flank pain and renal  colic.  She was seen in my office, treated and found on CT scan to have  what appeared to be two stones in the distal left ureter.  She had to go  to the emergency room a day later with severe flank pain and therefore  was brought to the operating room for ureteroscopic extraction of  stones.  We discussed the procedure, its risks, complications and  alternatives.  She understands and elected to proceed.   DESCRIPTION OF OPERATION:  After informed consent, the patient was  brought to the major OR, placed on table administered general anesthesia  and then moved to the dorsal lithotomy position.  Genitalia was  sterilely prepped and draped and official time-out was then performed.  Initially the 6-French rigid ureteroscope was introduced per urethra  under direct visualization into the bladder.  There were no tumors,  stones or inflammatory lesions seen within the bladder.  The left  orifice was visualized.  It was noted to be patulous and was widely  patent so I was easily able to pass the ureteroscope under direct  vision  up the left ureter.  I encountered a stone and photographed it.  I was  able to engage this with the nitinol basket and extracted it without  difficulty.   I then reinserted the ureteroscope in the left ureter and a second stone  was noted.  This was engaged in the nitinol basket but could not be  easily manipulated and rather than risk injury to the ureter.  I elected  to treat with laser lithotripsy.   The 360 micron holmium laser fiber was then passed through the  ureteroscope and the stone was fully fragmented.  The larger fragments  were then extracted with a nitinol basket and I then passed a 0.038 inch  floppy tip guidewire up the left ureter under direct fluoroscopic  control.  I then removed the ureteroscope, noting no injury to the  ureter or remaining fragments.   I back loaded the cystoscope over the guidewire and passed the Polaris  stent over the guidewire into the area of the renal pelvis.  Then the  guidewire was removed.  Good curl was noted in the renal pelvis.  The  stent was seen exiting the left ureter and the bladder was drained and  the cystoscope removed.  The patient tolerated procedure well.  There  were no intraoperative complications.  She will be given a prescription  for some Pyridium Plus and return to my office beginning of next week  for cystoscopic removal of her stent.      Mark C. Vernie Ammons, M.D.  Electronically Signed     MCO/MEDQ  D:  11/17/2007  T:  11/18/2007  Job:  [id number]

## 2011-05-12 NOTE — H&P (Signed)
NAME:  [name] [name]                ACCOUNT NO.:  [id number]   MEDICAL RECORD NO.:  [id number]           PATIENT TYPE:   LOCATION:                                 FACILITY:   PHYSICIAN:  Colleen Can. Deborah Chalk, M.D.DATE OF BIRTH:  1946-02-16   DATE OF ADMISSION:  12/12/2008  DATE OF DISCHARGE:                              HISTORY & PHYSICAL   CHIEF COMPLAINT:  Chest pain.   HISTORY OF THE PRESENT ILLNESS:  The patient is a 65 year old white  female who has a history of known atherosclerotic cardiovascular disease  with remote anterior MI in March 16, 1983 that was treated with  thrombolytics.  She had her last catheterization in April 2009.  She  presents to our office as a work-in appointment today on December 12, 2008 with complaints of angina started yesterday at about 11 o'clock  while she was cleaning her house.  She has soreness over the left breast  as well as going down arm.  She has taken nitroglycerin.  She has used  hydrocodone.  The discomfort sort of seems to be off and on.  She has  also had associated headache.  She has been under a significant amount  of situational stress in regards to church, family and home situation.  She has had chest pain here in our office and is now admitted for  further evaluation and management.   PAST MEDICAL HISTORY:  1. Atherosclerotic cardiovascular disease with a previous anterior MI      in March 1984 treated with thrombolytics.  Her last catheterization      was in April 2009, which showed anterior apical hypokinesia.      Ejection fraction was 45%.  There was a calcified proximal LAD with      well-preserved lumen and mild atherosclerotic disease elsewhere.      She was felt to best be managed with medically.  2. Remote angioplasty in 1984.  3. Anxiety and depression.  4. Hyperlipidemia.  5. Hyperthyroidism.  6. Diabetes.  7. Obesity.  8. History of bronchitis.  9. Past history of pneumonia.   ALLERGIES:  The patient reports  none; however, her medical record states  KEFLEX AND VICODIN.   MEDICATIONS:  Current medicines include:  1. Synthroid 150 mcg a day.  2. Cartia XT 240 a day.  3. Xanax 0.5 one in the morning and 2 at night.  4. Baby aspirin daily.  5. Lipitor 40 mg a day.  6. Lasix as needed.  7. Nitroglycerin as needed.  8. Prozac 10 mg a day.  9. Zetia 10 mg a day.  10.Actos 30 mg a day.  11.Generic Prilosec as needed.  12.Mucinex daily.  13.Xopenex as needed.  14.Advair as needed.   REVIEW OF SYSTEMS:  The patient has been under a significant amount of  situational stress.  She has had chest pain since yesterday.  She is  really not short of breath.  She has not really been exercising.  She  has had no recent fever, flu or cough; but, about 8 months ago she had a  significant bout of bronchitis.  She denies GI and GU symptoms; and, all  the other review of systems are negative.   SOCIAL HISTORY:  The patient is married.  She has no current alcohol or  tobacco history.   FAMILY HISTORY:  The family history is positive for coronary disease.   PHYSICAL EXAMINATION:  GENERAL APPEARANCE:  On exam she is anxious and  somewhat agitated.  She continues to complain of pain down the left arm.  VITAL SIGNS:  The patient's weight 213.5 pounds, blood pressure 152/90  and repeat is 160/90, and heart rate 67 and regular.  GENERAL:  In general she does follow commands.  HEENT:  Normocephalic and atraumatic.  NECK:  The neck is supple.  No JVD.  LUNGS:  The lungs are clear.  HEART:  Cardiac - exam shows a regular rhythm.  There is no murmur.  CHEST:  There is no chest wall tenderness.  ABDOMEN:  The abdomen is obese.  Positive bowel sounds.  Nontender.  EXTREMITIES:  The extremities are without edema.  NEUROLOGIC EXAMINATION:  Neurologically shows no gross focal deficits.   LABORATORY DATA:  The patient EKG shows sinus rhythm with an old  anterior myocardial infarction and nonspecific ST and T-wave  changes.  Her other labs are pending.   IMPRESSION:  The overall impression is:  1. Chest pain, questionable unstable angina.  2. History of remote history of myocardial infarction in 1984 treated      with thrombolytics and angioplasty.  3. Anxiety and depression.  4. Significant situational stress.  5. Diabetes.  6. Hyperthyroidism.  7. Hyperlipidemia.  8. Elevated blood pressure.   PLAN:  1. The patient will be admitted to the hospital overnight.  2. We will check serial enzymes.  3. We will place her on low-dose beta blocker therapy.  4. We will also start IV nitroglycerin and IV heparin.  5. The further treatment plan to follow will be per Dr. Ronnald Nian      discretion.      [name], N.P.      Colleen Can. Deborah Chalk, M.D.  Electronically Signed    LC/MEDQ  D:  12/12/2008  T:  12/12/2008  Job:  [id number]   cc:   Veverly Fells. Altheimer, M.D.

## 2011-05-12 NOTE — Discharge Summary (Signed)
NAME:  [name] [name], [name] [name]                ACCOUNT NO.:  [id number]   MEDICAL RECORD NO.:  [id number]          PATIENT TYPE:  INP   LOCATION:  4705                         FACILITY:  [facility]   PHYSICIAN:  Colleen Can. Deborah Chalk, M.D.DATE OF BIRTH:  [birth year]   DATE OF ADMISSION:  12/12/2008  DATE OF DISCHARGE:  12/13/2008                               DISCHARGE SUMMARY   DISCHARGE DIAGNOSES:  1. Chest pain with negative cardiac enzymes.  2. Known ischemic heart disease with remote anterior myocardial      infarction in March 1984, treated with thrombolytics and      angioplasty.  Last catheterization was in April 2009, which showed      anteroapical hypokinesia, ejection fraction of 45%, and a calcified      proximal left anterior descending.  She was felt to best be managed      medically.  3. Anxiety and depression.  4. Significant situational stress.  5. Hyperlipidemia.  6. Hypothyroidism.  7. Diabetes.  8. Obesity.  9. History of bronchitis and pneumonia.   HISTORY OF PRESENT ILLNESS:  The patient is a 65 year old white female  who has remote history of atherosclerotic cardiovascular disease with  remote anterior MI in March 1984, was treated with thrombolytics and  angioplasty.  Her last catheterization was earlier this year in April.  She has been managed medically since that time.  She presented to our  office with a work-in appointment on December 12, 2008 with complaints  of angina that had started the day before while she was cleaning her  house.  She had continued soreness over her left breast and radiation  down the arm.  She had taken some nitroglycerin and got very anxious.  Her discomfort continued to be intermittent.  She was seen in the office  and was complaining of discomfort at that time.  She has been under a  significant amount of situational stress in regards to church, family,  and home.  She was subsequently admitted for further evaluation.   Please see the history  and physical for further patient presentation and  profile.   LABORATORY DATA ON ADMISSION:  All of her cardiac enzymes were negative.  CBC was normal.  Chemistries were all normal.  Glucose was 99.  PT and  PTT were unremarkable.  LFTs were normal.  Calcium level was 10.  TSH  was 1.1.  BNP was 81.  Total cholesterol 127, LDL 71, HDL 44, and a  triglyceride level of 59.  Her EKG showed no acute changes.   HOSPITAL COURSE:  The patient was admitted from the office.  She was  placed on nitroglycerin and heparin.  She was also placed on round-the-  clock Xanax.  Her blood pressure had been noted to be elevated in the  office, but it subsequently came down quite nicely by the time she  arrived at [facility].  She was watched overnight.  She had negative  cardiac enzymes.  The following morning her nitroglycerin and heparin  were discontinued.  Her activity was increased and she was ambulated in  the  hall with no further discomfort and she was felt to be a  satisfactory candidate for discharge.  Most of her symptoms are felt to  be at this time due to significant amount of stress that she has been  under.   DISCHARGE CONDITION:  Stable.   DISCHARGE MEDICATIONS:  1. Synthroid 150 mcg a day.  2. Cartia XT 240 a day.  3. Xanax 0.5 one in the morning, two at night.  4. Baby aspirin daily.  5. Lipitor 40 a day.  6. Lasix p.r.n.  7. Nitroglycerin p.r.n.  8. Prozac 10 mg a day.  9. Zetia 10 mg a day.  10.Actos 30 mg a day.  11.Prilosec p.r.n.  12.Mucinex daily.  13.Xopenex and Advair as needed.   We will plan on seeing her back in the office in approximately 1 week,  certainly sooner if any problems arise.  We will also have her discussed  with Dr. Leslie Dales the possibility of discontinuing her Actos and  switching to a different diabetic agent per her request.      [name], N.P.      Colleen Can. Deborah Chalk, M.D.  Electronically Signed    LC/MEDQ  D:  12/13/2008  T:   12/13/2008  Job:  [id number]   cc:   Veverly Fells. Altheimer, M.D.

## 2011-05-12 NOTE — Op Note (Signed)
NAME:  [name] [name], [name] [name]                ACCOUNT NO.:  [id number]   MEDICAL RECORD NO.:  [id number]         PATIENT TYPE:  HAMB   LOCATION:                               FACILITY:  [facility]   PHYSICIAN:  Mark C. Vernie Ammons, M.D.  DATE OF BIRTH:  12/17/1946   DATE OF PROCEDURE:  DATE OF DISCHARGE:                               OPERATIVE REPORT   PREOPERATIVE DIAGNOSIS:  Right ureteral stone.   POSTOPERATIVE DIAGNOSIS:  Right ureteral stone.   PROCEDURE:  Cystoscopy, right ureteroscopy and stone extraction.   SURGEON:  Mark C. Vernie Ammons, M.D.   ANESTHESIA:  General.   SPECIMEN:  Stone given to the patient.   ESTIMATED BLOOD LOSS:  Zero.   COMPLICATIONS:  None.   DRAINS:  None.   INDICATIONS:  The patient is a 65 year old white female who has a known  history of kidney stones.  She developed right flank pain recently and  was found to have a right ureteral stone.  She has undergone previous  lithotripsy but did not tolerate that particularly well.  I therefore  discussed ureteroscopic extraction with her and went over the risks and  complications.  She understands and elects to proceed.   DESCRIPTION OF OPERATION:  After informed consent, the patient was  brought to the major operating room and placed on the table and  administered general anesthesia and then moved to the dorsal lithotomy  position.  Her genitalia was sterilely prepped and draped and an  official time-out was performed.  Initially the 6 French ureteroscope  was passed per urethra under direct visualization of the bladder and the  bladder was noted to be free of any tumor, stones or inflammatory  lesions.  A mild cystocele was identified.  The right orifice was also  identified and noted to be fairly wide.  I was able to easily pass the  ureteroscope up the right ureter under direct visualization without  difficulty and visualize the stone.  It appeared elongated and I thought  it was probably be of a size that it could  be easily extracted with some  gentle dilation of the ureter.  I therefore passed a 0.038 inch floppy  tip guidewire under visualization past the stone and up the right  ureter.  I then removed the ureteroscope and left the guidewire in  place.   Over the guidewire initially the inner cannula of the ureteral access  sheath was then passed over the guidewire and gently up to the location  of the stone.  I then removed the inner portion and inserted this into  the outer portion of the ureteral access sheath and then passed this  again over the guidewire, gently dilating the ureter up to the level of  the stone.  I then removed the guidewire as well as the ureteral access  sheath completely.   I reinserted the ureteroscope in the right ureter and under direct  vision passed it up to the stone which was identified.  A Nitinol basket  was then used to engage the stone in an orientation such that it was  oriented lengthwise along the axis of the ureter and then removed both  the ureteroscope and the stone in the basket simultaneously without  difficulty.  Because there was minimal trauma to the ureter.  I elected  not to place a double J stent and the patient was awakened and taken to  the recovery room in stable satisfactory condition.  She tolerated the  procedure well and there were no intraoperative complications.  She was  given 30 mg of IV Toradol at the end of the procedure and will be  observed in the recovery room until she is fully recovered and then  discharged home with written instructions.  She will follow up with me  in 6 months for repeat KUB.      Mark C. Vernie Ammons, M.D.  Electronically Signed     MCO/MEDQ  D:  07/04/2008  T:  07/04/2008  Job:  [id number]

## 2011-05-12 NOTE — H&P (Signed)
NAME:  Kinser, Maurie                ACCOUNT NO.:  [id number]   MEDICAL RECORD NO.:  [id number]           PATIENT TYPE:   LOCATION:                                 FACILITY:   PHYSICIAN:  Colleen Can. Deborah Chalk, M.D.DATE OF BIRTH:  [birth year]   DATE OF ADMISSION:  04/18/2008  DATE OF DISCHARGE:                              HISTORY & PHYSICAL   CHIEF COMPLAINT:  None.   HISTORY OF PRESENT ILLNESS:  Mrs. Margulies is a 65 year old white female  who is referred for diagnostic cardiac catheterization.  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  She had  her last catheterization in May 2005.  She has been managed medically  since that time.  She was seen for her routine office visit earlier this  month and was referred for a Cardiolite study.  She exercised on the  standard Bruce protocol only for a total of 6 minutes and 30 seconds.  She had reduced exercise tolerance.  Her blood pressure response was  adequate.  She had no complaints of chest pain.  Her EKG was negative.  However, she had mild anterior ischemia noted as well as mild anterior  hypokinesia.  Her ejection fraction was normal at 67%.  In light of  these findings, she is now referred for repeat cardiac catheterization.  Clinically, she has had no complaints of chest pain.   PAST MEDICAL HISTORY:  1. 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  Her last catheterization      was in May 2005, which showed anterior apical hypokinesia with      ejection fraction of 45%, calcified proximal LAD with mild disease      elsewhere.  2. Anxiety, depression.  3. Hyperlipidemia.  4. Hypothyroidism.  5. Glucose intolerance.  6. Obesity.  7. Recurrent bronchitis.  8. Past history of pneumonia.   ALLERGIES:  KEFLEX and VICODIN.   CURRENT MEDICINES:  Include:  1.  Synthroid 150 mcg a day.  2. Cartia XT 240 a day.  3. Xanax 0.5 in the morning, 2 tablets in the evening.  4. Baby aspirin daily.  5. Lasix 40 mg a day p.r.n.  6. Lipitor 40 mg a day.  7. Nitroglycerin p.r.n.  8. Prozac 10 mg a day.  9. Zetia 10 mg a day.  10.Actos 45 mg a day.  11.Generic Prilosec p.r.n.  12.She has just finished a course of Zithromax and Mucinex.   FAMILY HISTORY:  Noncontributory.   SOCIAL HISTORY:  She is married.  She has no current alcohol or tobacco  use.   REVIEW OF SYSTEMS:  As noted above and is otherwise unremarkable.   PHYSICAL EXAMINATION:  VITAL SIGNS:  Her weight is 203.5 pounds; blood  pressure is 120/72 sitting, 120/70 standing; heart rate is 84;  respirations 18.  She is afebrile.  SKIN:  Warm and dry.  Color is unremarkable.  LUNGS:  Bases clear.  HEART:  Shows a  regular rhythm.  ABDOMEN: Obese, soft, positive bowel sounds, nontender.  EXTREMITIES:  Without edema.  NEUROLOGIC:  Shows no gross focal deficits.   PERTINENT LABS:  Pending.   OVERALL IMPRESSION:  1. Abnormal stress Cardiolite study.  2. Remote history of anterior myocardial infarction.  3. Known ischemic heart disease.  4. Hyperlipidemia.  5. Diabetes.  6. Hypothyroidism.  7. Chronic anxiety and depression.   PLAN:  We will proceed with diagnostic cardiac catheterization.  Procedure risks and benefits have all been explained, and she is willing  to proceed on Wednesday, April 18, 2008.      [name], N.P.      Colleen Can. Deborah Chalk, M.D.  Electronically Signed    LC/MEDQ  D:  04/17/2008  T:  04/18/2008  Job:  [id number]

## 2011-05-12 NOTE — Cardiovascular Report (Signed)
NAME:  [name] [name], [name] [name]                ACCOUNT NO.:  [id number]   MEDICAL RECORD NO.:  [id number]          PATIENT TYPE:  OIB   LOCATION:  NA                           FACILITY:  [facility]   PHYSICIAN:  Colleen Can. Deborah Chalk, M.D.DATE OF BIRTH:  11/03/1946   DATE OF PROCEDURE:  04/18/2008  DATE OF DISCHARGE:                            CARDIAC CATHETERIZATION   PROCEDURES:  1. Left heart catheterization.  2. Selective coronary artery angiography.  3. Left ventricular angiography.  4. Abdominal aortic angiography.   TYPE AND SITE OF ENTRY:  Percutaneous, right femoral artery.   CATHETERS:  1. A 4-French four curved Judkins on left coronary catheter.  2. A 4-French 3DRC right coronary catheter.  3. A 4-French pigtail ventriculography catheter.   MEDICATIONS PRIOR TO PROCEDURE:  Valium 10 mg p.o.   MEDICATIONS DURING THE PROCEDURE:  Versed 8 mg IV.   COMMENT:  The patient tolerated the procedure well.   HEMODYNAMIC DATA:  The aortic pressure was 121/60.  LV was 131/25.  However, with aortic valve pullback, the LV pressure was 125/15-20 and  the aortic pressure was 126/60.   ANGIOGRAPHIC DATA:  1. Left main coronary artery is normal.  2. Left anterior descending had irregularities with 30%-40% narrowing      in midportion, right at the level of the first diagonal vessel.      There is calcification in left anterior descending, but no      significant obstructive disease.  3. Left circumflex was the dominant vessel.  There was 50% narrowing      in high and small obtuse marginal vessel that was a branch of the      bigger obtuse marginal.  There was no other significant focal      disease.  4. Right coronary artery was nondominant, essentially normal.   The left ventricular angiogram was performed in the RAO position.  Overall cardiac size and silhouette were normal.  There was mid-anterior  wall hypokinesis.  The apex contracted well with the inferior and distal  anterior wall being  vigorous.  Global ejection fraction was estimated to  be at 50%.   Abdominal aortogram showed normal renal arteries with good distal runoff  into the iliacs.  There was no significant stenosis at the origin of the  iliacs, but beyond that, the contrast had too much runoff to really be  able to make diagnostic considerations.   OVERALL IMPRESSION:  1. Mild anterior hypokinesis resulted from a remote myocardial      infarction.  2. Mild coronary atherosclerosis with 30%-40% narrowing in the mid-      left anterior descending and 50% narrowing in the small obtuse      marginal.  3. Normal renal arteries with no significant distal abdominal aortic      disease.      Colleen Can. Deborah Chalk, M.D.  Electronically Signed     SNT/MEDQ  D:  04/18/2008  T:  04/18/2008  Job:  [id number]

## 2011-05-15 NOTE — H&P (Signed)
NAME:  [name] [name], [name] [name]                          ACCOUNT NO.:  [id number]   MEDICAL RECORD NO.:  [id number]                   PATIENT TYPE:  EMS   LOCATION:  MAJO                                 FACILITY:  [facility]   PHYSICIAN:  Colleen Can. Deborah Chalk, M.D.            DATE OF BIRTH:  January 14, 1946   DATE OF ADMISSION:  05/07/2004  DATE OF DISCHARGE:                                HISTORY & PHYSICAL   CHIEF COMPLAINT:  Chest pain.   HISTORY OF PRESENT ILLNESS:  The patient is a 65 year old female who has a  known history of a remote anterior MI with her last catheterization being in  1991.  Her last Cardiolite study was in 2003.  She presents as a work-in  appointment with a three day history of chest pain that has been  intermittent in nature.  She describes it as identical to her previous chest  pain syndrome. It has been responsive to nitroglycerin.  She has been using  at least two to three nitroglycerin per day only to have reoccurrence of  symptoms.  Primarily, her episodes would occur at rest.  However, today  while she was walking in [place] she had the reoccurrence of chest pain  that is described as a banding like sensation primarily around the left  chest.  She is very anxious and has been short of breath.  She subsequently  came in for further evaluation.   PAST MEDICAL HISTORY:  1. Atherosclerotic cardiovascular disease with a previous anterior MI in     March 1984 treated with thrombolytics. Her last catheterization was in     1991.  2. Anxiety/depression.  3. Hyperlipidemia.  4. Hypothyroidism.  5. Glucose intolerance.  6. Obesity.  7. History of bronchitis with pneumonia and hemoptysis.   ALLERGIES:  1. KEFLEX.  2. VICODIN.   CURRENT MEDICATIONS:  1. Topamax 25 mg a day.  2. Actonel once a week.  3. Zetia 10 mg a day.  4. Prozac 30 mg daily.  5. Prilosec p.[name].n.  6. Nitroglycerin p.[name].n.  7. Lasix 40 mg p.[name].n.  8. Lipitor 30 mg daily.  9. Baby aspirin daily.  10.       Xanax 0.5 mg one b.i.d. and two tablets at bedtime.  11.      Cartia XT 240 mg daily.  12.      Synthroid 150 mcg daily.   FAMILY HISTORY:  Noncontributory.   SOCIAL HISTORY:  She is married.  There is no current tobacco use.   REVIEW OF SYMPTOMS:  GENERAL:  She does note that she felt somewhat chilled  over the past weekend.  GI:  She did have some upset stomach and  approximately one week ago had some irritable bowel type symptoms.  CARDIOVASCULAR:  She is wondering if perhaps she had recurrence of angina  over this past weekend. She is under a significant amount of stress in  regards to  the care of some of her grandchildren.  She denies being  lightheaded or dizzy.  She has had no frank syncope.  PULMONARY:  She has  had no cough.  She has had no complaints of edema.   PHYSICAL EXAMINATION:  GENERAL:  She is very anxious, basically unable to  lie still.  VITAL SIGNS:  Blood pressure is 140/70 sitting and standing, heart rate 72,  respirations are 22 and she is afebrile.  SKIN:  Warm and dry.  The color is unremarkable.  LUNGS: Clear.  CARDIAC:  Regular rhythm.  ABDOMEN:  Soft with positive bowel sounds.  EXTREMITIES:  Without edema.  NEUROLOGIC: Intact and there are no gross focal deficits.   LABORATORY DATA:  Pertinent laboratories are pending.   Twelve lead electrocardiogram shows evidence of previous anterior MI and  there are no acute changes.   IMPRESSION:  1. Recurrent episodes of chest pain responsive to nitroglycerin.  2. Known ischemic heart disease with history of a remote myocardial     infarction in 1984.  3. Anxiety/depression.  4. Hypertension.  5. Hyperlipidemia.  6. Obesity.   PLAN:  We will proceed on with admission for observation. We will follow  cardiac enzymes.  She will be placed on topical nitrates as well as  subcutaneous Lovenox.      [name], N.P.                     Colleen Can. Deborah Chalk, M.D.    LC/MEDQ  D:  05/07/2004  T:   05/07/2004  Job:  [id number]   cc:   Veverly Fells. Altheimer, M.D.  [address], [address]  [place]  [place] [address]  Fax: [phone]

## 2011-05-15 NOTE — Cardiovascular Report (Signed)
NAME:  [name] [name], [name] [name]                          ACCOUNT NO.:  [id number]   MEDICAL RECORD NO.:  [id number]                   PATIENT TYPE:  INP   LOCATION:  4712                                 FACILITY:  [facility]   PHYSICIAN:  Colleen Can. Deborah Chalk, M.D.            DATE OF BIRTH:  07/24/1946   DATE OF PROCEDURE:  05/08/2004  DATE OF DISCHARGE:                              CARDIAC CATHETERIZATION   PROCEDURES PERFORMED:  1. Left heart catheterization with,  2. Selective coronary angiography.  3. Left ventricular angiography.   CARDIOLOGIST:  Colleen Can. Deborah Chalk, M.D.   HISTORY:  [name] had previous anterior myocardial infarction in 1984.  She had previous angioplasty at that point in time.  She has done well over  the last 10 or more years, but presents with an episode of substernal chest  pain.   ACCESS:  Type and site of entry; percutaneous right femoral artery with  Angio-Seal.   CATHETERS:  Six French 4 curved Judkins right and left coronary catheters.  Six French pigtail ventriculographic catheter.   CONTRAST MATERIAL:  Omnipaque.   MEDICATIONS:  Medication given prior to the procedure; Valium 10 mg p.o.   Medication given during the procedure; Versed 5 mg IV.   COMMENTS:  The patient tolerated the procedure well.   ANGIOGRAPHIC DATA:  1. Left Main Coronary Artery:  The left main coronary artery is essentially     normal.   1. Left Anterior Descending:  The left anterior descending is a long vessel     that crosses the apex.  There is calcification in the proximal left     anterior descending before the large second diagonal vessel.  There is     also a more distal diagonal.  In this area of the proximal left anterior     descending there is calcification, but persistent patency with no greater     than 30-40% narrowing.  The lumen is relatively large and at least 3+ mm     in diameter.  There is heavy calcification in this area.  There does not     appear to be any  dissected plaque.   1. Left Circumflex:  The left circumflex is a reasonably large dominant     vessel.  There are 20-30% narrowings in the ostium of the obtuse     marginal, but overall the vessel is smooth with excellent and no     significant obstructive disease.   1. 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     It is normal otherwise.   1. Left Ventricular Angiogram:  Left ventricular angiogram was performed in     the RAO position.  Overall cardiac and silhouette are normal.  There was     anteroapical akinesia.  The global ejection fraction would be 45%.  There     is no intracardiac calcification, intracavitary filling defect or  mitral     regurgitation.  This regional wall motion abnormality would be consistent     with the remote anterior myocardial infarction from 1984.   HEMODYNAMIC DATA:  The aortic pressure was 117/65.  LV pressure was 136/8-  18.  There is no aortic valve gradient noted on pullback.   OVERALL IMPRESSION:  1. Anteroapical hypokinesia consistent with the old anterior myocardial     infarction with a reasonably well-preserved global left ventricular     function of approximately 45%.  2. Calcified proximal left anterior descending with well-preserved lumen,     with mild atherosclerotic disease elsewhere.   DISCUSSION:  With this we will treat [name] with Plavix for  approximately one month.  It is felt that her prognosis should be excellent;  and, we do have long-term patency of the angioplasty site from 20 years ago.                                               Colleen Can. Deborah Chalk, M.D.    SNT/MEDQ  D:  05/08/2004  T:  05/09/2004  Job:  [id number]   cc:   Veverly Fells. Altheimer, M.D.  [address]  [place]  [place] [address]  Fax: [phone]

## 2011-05-15 NOTE — Discharge Summary (Signed)
NAME:  [name] [name], [name] [name]                          ACCOUNT NO.:  [id number]   MEDICAL RECORD NO.:  [id number]                   PATIENT TYPE:  INP   LOCATION:  2854                                 FACILITY:  [facility]   PHYSICIAN:  Colleen Can. Deborah Chalk, M.D.            DATE OF BIRTH:  04-08-1946   DATE OF ADMISSION:  05/07/2004  DATE OF DISCHARGE:                                 DISCHARGE SUMMARY   PRIMARY DISCHARGE DIAGNOSIS:  Prolonged bouts of chest pain with subsequent  negative cardiac enzymes with elective cardiac catheterization revealing  normal left main, calcified LAD with an approximate 30% narrowing, but with  good luminal size, irregularities and a large left circumflex and normal,  yet small, right coronary artery.  The patient will continue to be managed  medically.   SECONDARY DISCHARGE DIAGNOSES:  1. Significant anxiety and depression.  2. Hyperlipidemia.  3. Hypothyroidism.  4. Glucose intolerance.   HISTORY OF PRESENT ILLNESS:  The patient is a 65 year old female who has a  history of a remote MI with her last catheterization in 1991.  She had her  last Cardiolite study in May 2003.  She presents with an approximate three  day history of intermittent chest pain.  This has been responsive to  nitroglycerin.  She has been using approximately two to three per day, only  to have recurrence.  Today, on the day of admission, she had an episode  while she was walking in [place].  She had no other associated symptoms.  She was subsequently seen in the office and was admitted for further medical  management.   Please see the dictated history and physical for further patient  presentation and profile.   LABORATORY DATA:  On admission, CK MBs were negative, troponins were  negative.  Chemistries were normal, except for a glucose of 100.  CBC was  normal, except for a white count of 11.9.   Chest x-ray is pending at the time of this dictation.   A 12-lead electrocardiogram  showed no acute changes.   HOSPITAL COURSE:  The patient was admitted electively from the office.  She  was placed on topical nitrates and subcutaneous Lovenox.  She ruled out  negative for myocardial infarction per serial enzymes.  We proceeded on with  the cardiac catheterization the following morning.  Those results are as  noted above.  She is felt to continue to be stable from a cardiovascular  standpoint and will continue to be followed medically.  Post procedure she  was transferred to the outpatient area and plans are now made for her to be  discharged later on today, once her bed rest is complete and she is able to  be up and ambulatory without problems.   DISCHARGE CONDITION:  Stable.   DISCHARGE MEDICATIONS:  She will resume all of her previous medications as  she was taking before with no changes made.  Her activity is to be progressive.   She is to place an ice pack if needed to the groin.  Otherwise, we will see  her back in approximately two weeks, certainly sooner if problems arise.      [name], N.P.                     Colleen Can. Deborah Chalk, M.D.    LC/MEDQ  D:  05/08/2004  T:  05/08/2004  Job:  [id number]   cc:   Veverly Fells. Altheimer, M.D.  [address], [address]  [place]  [place] [address]  Fax: [phone]   Colleen Can. Deborah Chalk, M.D.  Fax: [phone]

## 2011-05-28 ENCOUNTER — Encounter: Payer: Self-pay | Admitting: Cardiology

## 2011-09-24 LAB — POCT I-STAT 4, (NA,K, GLUC, HGB,HCT)
Glucose, Bld: 108 — ABNORMAL HIGH
HCT: 41
Hemoglobin: 13.9
Operator id: 280881
Potassium: 4.2
Sodium: 137

## 2011-09-24 LAB — URINALYSIS, ROUTINE W REFLEX MICROSCOPIC
Bilirubin Urine: NEGATIVE
Glucose, UA: NEGATIVE
Ketones, ur: NEGATIVE
Protein, ur: NEGATIVE

## 2011-09-24 LAB — URINE MICROSCOPIC-ADD ON

## 2011-09-24 LAB — POCT I-STAT, CHEM 8
Calcium, Ion: 1.23
Chloride: 101
HCT: 38

## 2011-10-02 LAB — COMPREHENSIVE METABOLIC PANEL
ALT: 20 U/L (ref 0–35)
AST: 22 U/L (ref 0–37)
Alkaline Phosphatase: 106 U/L (ref 39–117)
CO2: 29 mEq/L (ref 19–32)
Chloride: 104 mEq/L (ref 96–112)
GFR calc Af Amer: >60 mL/min (ref >60)
GFR calc non Af Amer: >60 mL/min (ref >60)
Potassium: 3.8 mEq/L (ref 3.5–5.1)
Sodium: 138 mEq/L (ref 135–145)
Total Bilirubin: 0.7 mg/dL (ref 0.3–1.2)

## 2011-10-02 LAB — CBC
HCT: 37.4 % (ref 36.0–46.0)
MCHC: 32.7 g/dL (ref 30.0–36.0)
MCV: 89.1 fL (ref 78.0–100.0)
Platelets: 270 10*3/uL (ref 150–400)
RBC: 4.41 MIL/uL (ref 3.87–5.11)
RDW: 14 % (ref 11.5–15.5)
WBC: 7.7 10*3/uL (ref 4.0–10.5)

## 2011-10-02 LAB — LIPID PANEL
Cholesterol: 127 mg/dL (ref 0–200)
LDL Cholesterol: 71 mg/dL (ref 0–99)
Total CHOL/HDL Ratio: 2.9 RATIO
Triglycerides: 59 mg/dL (ref <150)
VLDL: 12 mg/dL (ref 0–40)

## 2011-10-02 LAB — CARDIAC PANEL(CRET KIN+CKTOT+MB+TROPI)
CK, MB: 0.7 ng/mL (ref 0.3–4.0)
Relative Index: INVALID (ref 0.0–2.5)
Relative Index: INVALID (ref 0.0–2.5)
Total CK: 54 U/L (ref 7–177)
Troponin I: 0.01 ng/mL (ref 0.00–0.06)

## 2011-10-02 LAB — GLUCOSE, CAPILLARY

## 2011-10-02 LAB — HEPARIN LEVEL (UNFRACTIONATED): Heparin Unfractionated: 0.74 IU/mL — ABNORMAL HIGH (ref 0.30–0.70)

## 2011-10-06 LAB — URINALYSIS, ROUTINE W REFLEX MICROSCOPIC
Bilirubin Urine: NEGATIVE
Bilirubin Urine: NEGATIVE
Glucose, UA: NEGATIVE
Ketones, ur: NEGATIVE
Ketones, ur: NEGATIVE
Nitrite: NEGATIVE
Nitrite: NEGATIVE
Protein, ur: NEGATIVE
Specific Gravity, Urine: 1.015
Urobilinogen, UA: 0.2
pH: 5
pH: 6

## 2011-10-06 LAB — BASIC METABOLIC PANEL
BUN: 15
CO2: 31
Calcium: 9.9
Chloride: 105
Creatinine, Ser: 0.77
GFR calc Af Amer: >60
GFR calc non Af Amer: >60
Glucose, Bld: 94
Potassium: 4.3
Sodium: 143

## 2011-10-06 LAB — HEMOGLOBIN AND HEMATOCRIT, BLOOD
HCT: 33.9 — ABNORMAL LOW
Hemoglobin: 11.5 — ABNORMAL LOW

## 2011-10-06 LAB — URINE MICROSCOPIC-ADD ON

## 2011-11-11 ENCOUNTER — Other Ambulatory Visit: Payer: Self-pay | Admitting: Cardiology

## 2011-12-29 HISTORY — PX: CATARACT EXTRACTION W/ INTRAOCULAR LENS  IMPLANT, BILATERAL: SHX1307

## 2012-01-12 ENCOUNTER — Encounter: Payer: Self-pay | Admitting: Cardiology

## 2012-01-15 ENCOUNTER — Other Ambulatory Visit: Payer: Self-pay | Admitting: Urology

## 2012-01-19 ENCOUNTER — Encounter (HOSPITAL_BASED_OUTPATIENT_CLINIC_OR_DEPARTMENT_OTHER): Payer: Self-pay | Admitting: *Deleted

## 2012-01-20 ENCOUNTER — Encounter (HOSPITAL_BASED_OUTPATIENT_CLINIC_OR_DEPARTMENT_OTHER): Payer: Self-pay | Admitting: *Deleted

## 2012-01-20 NOTE — Progress Notes (Signed)
NPO AFTER MN. ARRIVES AT 1000. NEEDS ISTAT AND EKG. WILL TAKE TOPROL, XANAX, AND PRILOSEC AM OF SURG. W/ SIP WATER.

## 2012-01-22 ENCOUNTER — Ambulatory Visit (INDEPENDENT_AMBULATORY_CARE_PROVIDER_SITE_OTHER): Payer: Medicare Other | Admitting: Cardiology

## 2012-01-22 ENCOUNTER — Encounter: Payer: Self-pay | Admitting: Cardiology

## 2012-01-22 VITALS — BP 132/78 | HR 60 | Ht 68.0 in | Wt 187.0 lb

## 2012-01-22 DIAGNOSIS — E785 Hyperlipidemia, unspecified: Secondary | ICD-10-CM

## 2012-01-22 DIAGNOSIS — I2510 Atherosclerotic heart disease of native coronary artery without angina pectoris: Secondary | ICD-10-CM | POA: Insufficient documentation

## 2012-01-22 DIAGNOSIS — I10 Essential (primary) hypertension: Secondary | ICD-10-CM

## 2012-01-22 NOTE — Assessment & Plan Note (Signed)
Blood pressure controlled. Continue present medications. 

## 2012-01-22 NOTE — Assessment & Plan Note (Signed)
Continue aspirin and statin. Last Myoview showed no ischemia. Continue risk factor modification. 

## 2012-01-22 NOTE — Assessment & Plan Note (Signed)
Continue statin. Lipids and liver monitored by primary care. 

## 2012-01-22 NOTE — Progress Notes (Signed)
HPI: 66 yo female previously followed by Dr Deborah Chalk for fu of CAD. 20 minutes spent reviewing previous records and chart. Patient had an anterior myocardial infarction in 1984 that was treated with thrombolytics and angioplasty. Last echocardiogram in 2008 showed normal function, distal septal hypokinesis, impaired LV relaxation, mild tricuspid regurgitation. Cardiac catheterization in April of 2009 showed a normal left main, a 30-40% mid LAD. There was a 50% lesion in a small obtuse marginal. The right coronary was normal. The ejection fraction was 50%. There was mild anterior wall hypokinesis. Abdominal aortogram showed normal renal arteries. There was no iliac disease. Last Myoview performed in April of 2012 showed an ejection fraction of 63%. There was no ischemia but possible prior anterior infarct. Since she was last seen, the patient denies any dyspnea on exertion, orthopnea, PND, pedal edema, palpitations, syncope or chest pain.   Current Outpatient Prescriptions  Medication Sig Dispense Refill  . acetaminophen (TYLENOL) 500 MG tablet Take 500 mg by mouth every 6 (six) hours as needed.      . ALPRAZolam (XANAX) 0.5 MG tablet Take 1 mg by mouth. ONE TABLET IN AM, TWO TABLETS IN PM AND PRN X1 DURING THE DAY      . aspirin 81 MG tablet Take 81 mg by mouth daily.       Marland Kitchen atorvastatin (LIPITOR) 40 MG tablet Take 40 mg by mouth every evening.       . diltiazem (CARDIZEM CD) 120 MG 24 hr capsule Take 120 mg by mouth every evening.       . ezetimibe (ZETIA) 10 MG tablet Take 10 mg by mouth daily.       Marland Kitchen FLUoxetine (PROZAC) 20 MG capsule Take 20 mg by mouth daily.       . Fluticasone-Salmeterol (ADVAIR HFA IN) Inhale into the lungs as needed.       . furosemide (LASIX) 40 MG tablet Take 20 mg by mouth daily as needed.      Marland Kitchen ibuprofen (ADVIL,MOTRIN) 200 MG tablet Take 200 mg by mouth every 6 (six) hours as needed.      Marland Kitchen levothyroxine (SYNTHROID, LEVOTHROID) 150 MCG tablet Take 150 mcg by mouth  every morning.       Marland Kitchen losartan (COZAAR) 25 MG tablet Take 25 mg by mouth daily.      . metFORMIN (GLUCOPHAGE) 1000 MG tablet Take 1,000 mg by mouth 2 (two) times daily.       . metoprolol succinate (TOPROL-XL) 25 MG 24 hr tablet Take 25 mg by mouth every morning.       . naproxen (NAPROSYN) 500 MG tablet Take 500 mg by mouth 2 (two) times daily with a meal.      . nitroGLYCERIN (NITROSTAT) 0.4 MG SL tablet Place 0.4 mg under the tongue every 5 (five) minutes as needed.       Marland Kitchen omeprazole (PRILOSEC) 20 MG capsule Take 20 mg by mouth as needed.       . isometheptene-acetaminophen-dichloralphenazone (MIDRIN) 65-325-100 MG capsule Take 1 capsule by mouth 4 (four) times daily as needed.         Past Medical History  Diagnosis Date  . Ischemic heart disease   . Diabetes mellitus   . Obesity   . Chronic anxiety   . Nephrolithiasis   . Lumbar herniated disc   . Depression   . Situational stress   . Hyperlipemia   . History of bronchitis   . History of pneumonia   . Hypothyroidism   .  Coronary artery disease CARDIOLOGIST- DR Maylon Cos-- VISIT 04-01-2011 W/ CHART AND IN EPIC  . History of kidney stones S/P URETEROSCOPIC STONE EXTRACTIONS  . Right ureteral calculus   . 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  NO ISCHEMIA. SUSPECT THIS REPRESENTS PRIOR MI BUT PATTERN UNUSUAL GIVEN WORSE DEFECT AT REST. APICAL HYPODINESIS BUT OVERALL PRESERVED EF.  Marland Kitchen History of acute anterior wall MI MARCH 1984    S/P ANGIOPLASTY AND THROMBOLYTICS  . History of hypercalcemia   . History of echocardiogram 05-20-2007    NORMAL LVSF. DISTAL SEPTAL HYPOKINESIA. MILD LEVH W/ IMPAIRED LV RELAXATION. MILD AORTIC SCLEROSIS. MILD TRICUSPID REGURGITATION  . Arthritis   . GERD (gastroesophageal reflux disease)   . PONV (postoperative nausea and vomiting)   . Panic attack as reaction to stress   . Female bladder  prolapse   . Urge urinary incontinence     Past Surgical History  Procedure Date  . Left ureteroscopic laser litho/ stone extraction 12-16-2009  &  11-17-2007  . Right ureteroscopic stone extraction  07-04-2008  . Coronary angioplasty 1984  . Cardiac catheterization 04-18-2008; 05-08-2004;  11-29-1986; 08-19-1988    2009 REPORT---- MILD ANTERIOR HYPOKINESIS (FROM REMOTE MI), MILD CORONARY ATHEROSCLEROSIS W/ 30-40% NARROWING IN THE MID LAD & 50% NARROWING IN THE SMALL OBTUSE MARGINAL,  NORMAL RENAL ARTERIES W/ NO ABD. AORTIC DISEASE  . Carpal tunnel release 2005    RIGHT  . Extracorporeal shock wave lithotripsy 2007    X2  . Facial cosmetic surgery     History   Social History  . Marital Status: Married    Spouse Name: N/A    Number of Children: N/A  . Years of Education: N/A   Occupational History  . Not on file.   Social History Main Topics  . Smoking status: Former Smoker    Types: Cigarettes    Quit date: 12/28/1982  . Smokeless tobacco: Never Used  . Alcohol Use: Not on file  . Drug Use: Not on file  . Sexually Active: Not on file   Other Topics Concern  . Not on file   Social History Narrative  . No narrative on file    ROS: Problems with kidney stones but no fevers or chills, productive cough, hemoptysis, dysphasia, odynophagia, melena, hematochezia, dysuria, hematuria, rash, seizure activity, orthopnea, PND, pedal edema, claudication. Remaining systems are negative.  Physical Exam: Well-developed well-nourished anxious in no acute distress.  Skin is warm and dry.  HEENT is normal.  Neck is supple. No thyromegaly.  Chest is clear to auscultation with normal expansion.  Cardiovascular exam is regular rate and rhythm.  Abdominal exam nontender or distended. No masses palpated. Extremities show no edema. neuro grossly intact  ECG sinus rhythm at a rate of 54. Axis normal. RV conduction delay. Nonspecific T-wave changes.

## 2012-01-22 NOTE — Patient Instructions (Signed)
Your physician wants you to follow-up in: ONE YEAR You will receive a reminder letter in the mail two months in advance. If you don't receive a letter, please call our office to schedule the follow-up appointment.  

## 2012-01-24 NOTE — H&P (Signed)
History of Present Illness          She has a significant history of calculus disease. She has had lithotripsy and ureteroscopy in the past. She has known bilateral renal calculi. She had a stone impacted at the left UPJ and therefore on 12/16/09 underwent ureteroscopy with laser lithotripsy of that stone as well as a second stone present within the kidney. She is also is several stones spontaneously.    24-hour urine and serum studies revealed mild hyperoxaluria as well as low urinary volume as her primary risk factors with normal serum calcium and PTH.   Chronic cystitis: She has had difficulty with recurrent UTIs.  Stress incontinence: She underwent urodynamics in 6/12 which revealed a cystocele with stress urinary incontinence. She has no detrusor instability. She was having LUTS at the time that her study was obtained however she was also found to have a UTI which was treated and resulted in resolution of her symptoms. We discussed the treatment options for her SUI including pessary since she does have a cystocele as well as physical therapy. She elected not to proceed with any form of treatment at that time.  Interval history: She has noted intermittent gross hematuria and has been having some discomfort in her right lower quadrant.   Past Medical History Problems  1. History of  Acute Myocardial Infarction V12.59 2. History of  Anxiety (Symptom) 300.00 3. History of  Arthritis V13.4 4. History of  Bladder Calculus V13.01 5. History of  Depression 311 6. History of  Diabetes Mellitus 250.00 7. History of  Heartburn 787.1 8. History of  Hypercholesterolemia 272.0 9. History of  Ureteral Stone Left 592.1 10. History of  Ureteral Stone Right 592.1  Surgical History Problems  1. History of  Cystoscopy With Insertion Of Ureteral Stent Left Left 2. History of  Cystoscopy With Insertion Of Ureteral Stent Left 3. History of  Cystoscopy With Pyeloscopy With Lithotripsy 4. History of   Cystoscopy With Ureteroscopy With Lithotripsy Left 5. History of  Cystoscopy With Ureteroscopy With Removal Of Calculus Left 6. History of  Cystoscopy With Ureteroscopy With Removal Of Calculus 7. History of  Lithotripsy - Whole Body (Extracorporeal Shock Wave) Left 8. History of  Lithotripsy - Whole Body (Extracorporeal Shock Wave) Left  Current Meds 1. Aspirin 81 MG Oral Tablet; Therapy: (Recorded:05Dec2007) to 2. Diltiazem CD 240 MG CP24; Therapy: (Recorded:06Jul2009) to 3. FLUoxetine HCl TABS; Therapy: (Recorded:05Dec2007) to 4. Furosemide TABS; Therapy: (Recorded:06Jul2009) to 5. Lipitor 40 MG Oral Tablet; Therapy: (Recorded:24May2010) to 6. Losartan Potassium 50 MG Oral Tablet; Therapy: 14Dec2011 to 7. MetFORMIN HCl 1000 MG Oral Tablet; Therapy: (Recorded:24May2010) to 8. Metoprolol Succinate ER 25 MG Oral Tablet Extended Release 24 Hour; Therapy:  (Recorded:24May2010) to 9. Synthroid 150 MCG Oral Tablet; Therapy: (Recorded:05Dec2007) to 10. Vitamin D TABS; Therapy: (Recorded:05Dec2007) to 11. Xanax 0.5 MG Oral Tablet; Therapy: (Recorded:05Dec2007) to 12. Zetia 10 MG Oral Tablet; Therapy: (Recorded:05Dec2007) to  Allergies Medication  1. No Known Drug Allergies  Social History Problems  1. Never A Smoker  Physical Exam: General appearance: alert and appears stated age Head: Normocephalic, without obvious abnormality, atraumatic Eyes: conjunctivae/corneas clear. EOM's intact.  Oropharynx: moist mucous membranes Neck: supple, symmetrical, trachea midline Resp: normal respiratory effort Cardio: regular rate and rhythm Back: symmetric, no curvature. ROM normal. No CVA tenderness. GI: soft, non-tender; bowel sounds normal; no masses,  no organomegaly Pelvic: deferred Extremities: extremities normal, atraumatic, no cyanosis or edema Skin: Skin color normal. No visible rashes or lesions  Neurologic: Grossly normal Results/Data  The following images/tracing/specimen were  independently visualized:  KUB: She has what appears to be 2 stones in her left kidney. She also appears to have a single calcific overlying the right kidney. In addition there is a triangular-shaped calcification in the pelvis on the right-hand side along the course of the right ureter consistent with a possible distal ureteral stone.    Assessment Assessed  1. Distal Ureteral Stone On The Right 592.1 2. Nephrolithiasis Of Both Kidneys 592.0   She appears to have a distal right ureteral stone. It is fairly large. The options were discussed and she has had previous lithotripsy but did not tolerate this well. She had a lot of discomfort and it was very hard to achieve adequate sedation. I think it would be better to proceed with treatment of this stone using ureteroscopy. I've gone over the procedure with her including its risks and complications. She understands and has elected to proceed.   Plan Distal Ureteral Stone On The Right (592.1)  1. Follow-up Schedule Surgery Office  Follow-up  Requested for: 17Jan2013 Health Maintenance (V70.0)  2. UA With REFLEX  Done: 17Jan2013   She will be scheduled for right ureteroscopy and laser lithotripsy of her right distal ureteral stone.

## 2012-01-25 ENCOUNTER — Encounter (HOSPITAL_BASED_OUTPATIENT_CLINIC_OR_DEPARTMENT_OTHER): Payer: Self-pay | Admitting: *Deleted

## 2012-01-25 ENCOUNTER — Encounter (HOSPITAL_BASED_OUTPATIENT_CLINIC_OR_DEPARTMENT_OTHER): Payer: Self-pay | Admitting: Anesthesiology

## 2012-01-25 ENCOUNTER — Ambulatory Visit (HOSPITAL_BASED_OUTPATIENT_CLINIC_OR_DEPARTMENT_OTHER)
Admission: RE | Admit: 2012-01-25 | Discharge: 2012-01-25 | Disposition: A | Discharge status: Home / Self Care | Payer: Medicare Other | Source: Ambulatory Visit | Attending: Urology | Admitting: Urology

## 2012-01-25 ENCOUNTER — Other Ambulatory Visit: Payer: Self-pay

## 2012-01-25 ENCOUNTER — Encounter (HOSPITAL_BASED_OUTPATIENT_CLINIC_OR_DEPARTMENT_OTHER)
Admission: RE | Disposition: A | Discharge status: Home / Self Care | Payer: Self-pay | Source: Ambulatory Visit | Attending: Urology

## 2012-01-25 ENCOUNTER — Ambulatory Visit (HOSPITAL_BASED_OUTPATIENT_CLINIC_OR_DEPARTMENT_OTHER): Payer: Medicare Other | Admitting: Anesthesiology

## 2012-01-25 DIAGNOSIS — E119 Type 2 diabetes mellitus without complications: Secondary | ICD-10-CM | POA: Insufficient documentation

## 2012-01-25 DIAGNOSIS — I252 Old myocardial infarction: Secondary | ICD-10-CM | POA: Insufficient documentation

## 2012-01-25 DIAGNOSIS — N200 Calculus of kidney: Secondary | ICD-10-CM | POA: Insufficient documentation

## 2012-01-25 DIAGNOSIS — Z79899 Other long term (current) drug therapy: Secondary | ICD-10-CM | POA: Insufficient documentation

## 2012-01-25 DIAGNOSIS — N201 Calculus of ureter: Secondary | ICD-10-CM

## 2012-01-25 DIAGNOSIS — E7800 Pure hypercholesterolemia, unspecified: Secondary | ICD-10-CM | POA: Insufficient documentation

## 2012-01-25 DIAGNOSIS — Z7982 Long term (current) use of aspirin: Secondary | ICD-10-CM | POA: Insufficient documentation

## 2012-01-25 DIAGNOSIS — N8111 Cystocele, midline: Secondary | ICD-10-CM | POA: Insufficient documentation

## 2012-01-25 HISTORY — DX: Other specified postprocedural states: Z98.890

## 2012-01-25 HISTORY — PX: URETEROSCOPY: SHX842

## 2012-01-25 HISTORY — DX: Nausea with vomiting, unspecified: R11.2

## 2012-01-25 HISTORY — DX: Gastro-esophageal reflux disease without esophagitis: K21.9

## 2012-01-25 HISTORY — DX: Personal history of other medical treatment: Z92.89

## 2012-01-25 HISTORY — DX: Cystocele, unspecified: N81.10

## 2012-01-25 HISTORY — DX: Abnormal result of other cardiovascular function study: R94.39

## 2012-01-25 HISTORY — DX: Unspecified osteoarthritis, unspecified site: M19.90

## 2012-01-25 HISTORY — DX: Acute stress reaction: F43.0

## 2012-01-25 HISTORY — DX: Urge incontinence: N39.41

## 2012-01-25 HISTORY — DX: Calculus of ureter: N20.1

## 2012-01-25 HISTORY — DX: Atherosclerotic heart disease of native coronary artery without angina pectoris: I25.10

## 2012-01-25 HISTORY — DX: Personal history of other endocrine, nutritional and metabolic disease: Z86.39

## 2012-01-25 HISTORY — DX: Panic disorder (episodic paroxysmal anxiety): F41.0

## 2012-01-25 LAB — POCT I-STAT 4, (NA,K, GLUC, HGB,HCT)
Glucose, Bld: 123 mg/dL — ABNORMAL HIGH (ref 70–99)
HCT: 38 % (ref 36.0–46.0)
Hemoglobin: 12.9 g/dL (ref 12.0–15.0)
Potassium: 4.2 mEq/L (ref 3.5–5.1)
Sodium: 141 mEq/L (ref 135–145)

## 2012-01-25 LAB — GLUCOSE, CAPILLARY: Glucose-Capillary: 109 mg/dL — ABNORMAL HIGH (ref 70–99)

## 2012-01-25 SURGERY — URETEROSCOPY
Anesthesia: General | Site: Ureter | Laterality: Right | Wound class: Clean Contaminated

## 2012-01-25 MED ORDER — FENTANYL CITRATE 0.05 MG/ML IJ SOLN
INTRAMUSCULAR | Status: DC | PRN
  Administered 2012-01-25: 25 ug via INTRAVENOUS
  Administered 2012-01-25: 50 ug via INTRAVENOUS
  Administered 2012-01-25: 25 ug via INTRAVENOUS

## 2012-01-25 MED ORDER — PHENAZOPYRIDINE HCL 200 MG PO TABS: 200.0000 mg | ORAL_TABLET | Freq: Three times a day (TID) | ORAL | Status: AC | PRN

## 2012-01-25 MED ORDER — ONDANSETRON HCL 4 MG/2ML IJ SOLN
INTRAMUSCULAR | Status: DC | PRN
  Administered 2012-01-25: 4 mg via INTRAVENOUS

## 2012-01-25 MED ORDER — FENTANYL CITRATE 0.05 MG/ML IJ SOLN: 25.0000 ug | INTRAMUSCULAR | Status: DC | PRN

## 2012-01-25 MED ORDER — SODIUM CHLORIDE 0.9 % IR SOLN
Status: DC | PRN
  Administered 2012-01-25: 6000 mL

## 2012-01-25 MED ORDER — OXYBUTYNIN CHLORIDE 5 MG PO TABS: 5.0000 mg | ORAL_TABLET | Freq: Once | ORAL | Status: DC

## 2012-01-25 MED ORDER — LIDOCAINE HCL (CARDIAC) 20 MG/ML IV SOLN
INTRAVENOUS | Status: DC | PRN
  Administered 2012-01-25: 60 mg via INTRAVENOUS

## 2012-01-25 MED ORDER — CIPROFLOXACIN IN D5W 200 MG/100ML IV SOLN
200.0000 mg | INTRAVENOUS | Status: AC
  Administered 2012-01-25: 400 mg via INTRAVENOUS

## 2012-01-25 MED ORDER — BELLADONNA ALKALOIDS-OPIUM 16.2-60 MG RE SUPP
RECTAL | Status: DC | PRN
  Administered 2012-01-25: 1 via RECTAL

## 2012-01-25 MED ORDER — IOHEXOL 350 MG/ML SOLN
INTRAVENOUS | Status: DC | PRN
  Administered 2012-01-25: 50 mL

## 2012-01-25 MED ORDER — HYDROCODONE-ACETAMINOPHEN 10-300 MG PO TABS: 1.0000 | ORAL_TABLET | Freq: Four times a day (QID) | ORAL | Status: DC | PRN

## 2012-01-25 MED ORDER — LACTATED RINGERS IV SOLN: INTRAVENOUS | Status: DC

## 2012-01-25 MED ORDER — MIDAZOLAM HCL 5 MG/5ML IJ SOLN
INTRAMUSCULAR | Status: DC | PRN
  Administered 2012-01-25: 1 mg via INTRAVENOUS

## 2012-01-25 MED ORDER — PROMETHAZINE HCL 25 MG/ML IJ SOLN: 6.2500 mg | INTRAMUSCULAR | Status: DC | PRN

## 2012-01-25 MED ORDER — PHENAZOPYRIDINE HCL 200 MG PO TABS: 200.0000 mg | ORAL_TABLET | Freq: Once | ORAL | Status: DC

## 2012-01-25 MED ORDER — PROPOFOL 10 MG/ML IV EMUL
INTRAVENOUS | Status: DC | PRN
  Administered 2012-01-25: 150 mg via INTRAVENOUS
  Administered 2012-01-25: 50 mg via INTRAVENOUS

## 2012-01-25 MED ORDER — LACTATED RINGERS IV SOLN
INTRAVENOUS | Status: DC
  Administered 2012-01-25 (×2): via INTRAVENOUS

## 2012-01-25 SURGICAL SUPPLY — 35 items
ADAPTER CATH URET PLST 4-6FR (CATHETERS) IMPLANT
BAG DRAIN URO-CYSTO SKYTR STRL (DRAIN) ×2 IMPLANT
BASKET GRASPIT 2.6F 120CM (MISCELLANEOUS) ×2 IMPLANT
BASKET LASER NITINOL 1.9FR (BASKET) IMPLANT
BASKET STNLS GEMINI 4WIRE 3FR (BASKET) IMPLANT
BASKET ZERO TIP NITINOL 2.4FR (BASKET) ×4 IMPLANT
BRUSH URET BIOPSY 3F (UROLOGICAL SUPPLIES) IMPLANT
CANISTER SUCT LVC 12 LTR MEDI- (MISCELLANEOUS) ×2 IMPLANT
CATH INTERMIT  6FR 70CM (CATHETERS) IMPLANT
CATH URET 5FR 28IN CONE TIP (BALLOONS) ×1
CATH URET 5FR 70CM CONE TIP (BALLOONS) ×1 IMPLANT
CLOTH BEACON ORANGE TIMEOUT ST (SAFETY) ×2 IMPLANT
DRAPE CAMERA CLOSED 9X96 (DRAPES) ×2 IMPLANT
ELECT REM PT RETURN 9FT ADLT (ELECTROSURGICAL)
ELECTRODE REM PT RTRN 9FT ADLT (ELECTROSURGICAL) IMPLANT
GLOVE BIO SURGEON STRL SZ8 (GLOVE) ×2 IMPLANT
GLOVE BIOGEL PI IND STRL 6.5 (GLOVE) ×2 IMPLANT
GLOVE BIOGEL PI INDICATOR 6.5 (GLOVE) ×2
GOWN PREVENTION PLUS LG XLONG (DISPOSABLE) ×2 IMPLANT
GOWN STRL REIN XL XLG (GOWN DISPOSABLE) ×2 IMPLANT
GUIDEWIRE 0.038 PTFE COATED (WIRE) IMPLANT
GUIDEWIRE ANG ZIPWIRE 038X150 (WIRE) IMPLANT
GUIDEWIRE STR DUAL SENSOR (WIRE) ×2 IMPLANT
IV NS IRRIG 3000ML ARTHROMATIC (IV SOLUTION) ×4 IMPLANT
KIT BALLIN UROMAX 15FX10 (LABEL) IMPLANT
KIT BALLN UROMAX 15FX4 (MISCELLANEOUS) IMPLANT
KIT BALLN UROMAX 26 75X4 (MISCELLANEOUS)
LASER FIBER DISP (UROLOGICAL SUPPLIES) ×2 IMPLANT
PACK CYSTOSCOPY (CUSTOM PROCEDURE TRAY) ×2 IMPLANT
SET HIGH PRES BAL DIL (LABEL)
SHEATH URET ACCESS 12FR/35CM (UROLOGICAL SUPPLIES) ×2 IMPLANT
SHEATH URET ACCESS 12FR/55CM (UROLOGICAL SUPPLIES) IMPLANT
STENT POLARIS LOOP 6FR X 26 CM (STENTS) ×2 IMPLANT
SYR CONTROL 10ML LL (SYRINGE) ×2 IMPLANT
WATER STERILE IRR 3000ML UROMA (IV SOLUTION) IMPLANT

## 2012-01-25 NOTE — Interval H&P Note (Signed)
History and Physical Interval Note:  01/25/2012 10:14 AM  [name] [name]  has presented today for surgery, with the diagnosis of right ureteral stone   The various methods of treatment have been discussed with the patient and family. After consideration of risks, benefits and other options for treatment, the patient has consented to  Procedure(s): URETEROSCOPY as a surgical intervention .  The patients' history has been reviewed, patient examined, no change in status, stable for surgery.  I have reviewed the patients' chart and labs.  Questions were answered to the patient's satisfaction.     Garnett Farm

## 2012-01-25 NOTE — OR Nursing (Signed)
Stones obtained from right

## 2012-01-25 NOTE — Anesthesia Postprocedure Evaluation (Signed)
Anesthesia Post Note  Patient: [name] [name]  Procedure(s) Performed:  URETEROSCOPY; HOLMIUM LASER APPLICATION  Anesthesia type: General  Patient location: PACU  Post pain: Pain level controlled  Post assessment: Post-op Vital signs reviewed  Last Vitals:  Filed Vitals:   01/25/12 1315  BP: 132/98  Pulse: 66  Temp:   Resp: 16    Post vital signs: Reviewed  Level of consciousness: sedated  Complications: No apparent anesthesia complications

## 2012-01-25 NOTE — Op Note (Signed)
PATIENT:  [name] [name]  PRE-OPERATIVE DIAGNOSIS:  right distal ureteral calculus   POST-OPERATIVE DIAGNOSIS:  1. Right distal ureteral calculus 2. Right mid ureteral calculus.  PROCEDURE:   1. Cystoscopy with right retrograde pyelogram including interpretation. 2. Right ureteroscopy and laser lithotripsy of the distal ureteral stone. 3. Right ureteroscopy and laser lithotripsy of a newly diagnosed mid ureteral stone. 4. Ureteroscopic extraction of distal ureteral, mid ureteral and renal calculi. 5. Right double-J stent placement (no string)  SURGEON: Garnett Farm, MD  INDICATION: Mrs. Keepers is a 66 year old female with a history of calculus disease. She had noted intermittent gross hematuria and had been having right lower quadrant pain. Because of her history of stones a KUB was obtained and revealed a single palpation along the right kidney as well as a triangular-shaped calcification in the pelvis on the right-hand side along the course of the ureter just above the ureterovesical junction. She is brought to the operating room today for treatment of her distal ureteral stone with ureteroscopy.  ANESTHESIA:  General  EBL:  Minimal  DRAINS:  6 French, 26 cm Polaris stent (no string)  SPECIMEN:   Stone given the patient   DESCRIPTION OF PROCEDURE: The patient was taken to the major OR and placed on the table. General anesthesia was administered and then the patient was moved to the dorsal lithotomy position.  Her genitalia was sterilely prepped and draped. An official timeout was performed.  Initially the 22 French cystoscope with 12 lens was passed under direct vision. The bladder was then entered and fully inspected. It was noted be free of any tumors stones or inflammatory lesions. Ureteral orifices were of normal configuration and position. A 6 French open-ended ureteral catheter was then passed through the cystoscope into the ureteral orifice in order to perform a right  retrograde pyelogram.  A retrograde pyelogram was performed by injecting full-strength contrast up the right ureter under direct fluoroscopic control. It revealed a filling defect in the distal lureter consistent with the stone seen on the preoperative KUB.  As I injected contrast further though I could not see contrast passing beyond the area of the mid ureter. I then passed a 0.038 inch floppy-tipped guidewire through the open ended catheter and into the area of the mid ureter but could not get the guidewire to pass this location despite manipulation.  renal pelvis and this was left in place. I noted the right ureteral orifice was patulous and therefore did not need dilatation.  I then proceeded with ureteroscopy.  A 6 French rigid ureteroscope was then passed under direct into the bladder and into the right orifice and up the ureter. The stone was identified and I felt it was too large to extract and therefore elected to proceed with laser lithotripsy. The 200  holmium laser fiber was used to fragment the stone. I then used the nitinol basket to extract all of the stone fragments and reinspection of the ureter ureteroscopically revealed no further stone fragments and no injury to the ureter.  I then passed the scope further up the ureter and identified a second stone that was associated with a significant amount of edema. It was quite large. I again used the holmium laser to fragment the stone and intermittently would used the Nitinol basket to extract fragments as the stone broke up. As I was able to break up the stone I could then see beyond the stone and pass the guidewire beyond the stone and into the area  of the renal pelvis to be used as a safety guidewire. I removed the ureteroscope leaving the guidewire in place and then inserted the ureteroscope beside the guidewire and finished fragmenting the stone in the mid ureter and remove all of the fragments. I then passed the scope beyond this location and  noted the ureter to be dilated. I was able to pass the scope up into the area of the renal pelvis and identified several other stones located there where I engaged them with the Nitinol basket and extracted the smaller stones. There was a large stone that could not be extracted so I fragmented it with the holmium laser and then was able to extract the smaller pieces without difficulty. I then removed the ureteroscope and inspected the ureter as the scope was removed and noted no perforation or injury to the ureter however there was a great deal of edema where the previously impacted mid-ureteral stone had been located. I backloaded the cystoscope over the guidewire and passed the stent over the guidewire into the area of the renal pelvis. As the guidewire was removed good curl was noted in the renal pelvis. The bladder was drained and the cystoscope was then removed. The patient tolerated the procedure well no intraoperative complications.  PLAN OF CARE: Discharge to home after PACU  PATIENT DISPOSITION:  PACU - hemodynamically stable.

## 2012-01-25 NOTE — Transfer of Care (Signed)
Immediate Anesthesia Transfer of Care Note  Patient: [name] [name]  Procedure(s) Performed:  URETEROSCOPY; HOLMIUM LASER APPLICATION  Patient Location: PACU  Anesthesia Type: General  Level of Consciousness: ,sedate and oriented  Airway & Oxygen Therapy: Patient Spontanous Breathing and Patient connected to face mask oxygen  Post-op Assessment: Report given to PACU RN and Post -op Vital signs reviewed and stable  Post vital signs: Reviewed and stable  Complications: No apparent anesthesia complications

## 2012-01-25 NOTE — Anesthesia Procedure Notes (Signed)
Procedure Name: LMA Insertion Performed by: Huel Coventry Pre-anesthesia Checklist: Patient identified, Emergency Drugs available, Suction available and Patient being monitored Patient Re-evaluated:Patient Re-evaluated prior to inductionOxygen Delivery Method: Circle System Utilized Preoxygenation: Pre-oxygenation with 100% oxygen Intubation Type: IV induction Ventilation: Mask ventilation without difficulty LMA: LMA inserted LMA Size: 4.0 Number of attempts: 2 Airway Equipment and Method: bite block Placement Confirmation: positive ETCO2 Dental Injury: Teeth and Oropharynx as per pre-operative assessment

## 2012-01-25 NOTE — Anesthesia Preprocedure Evaluation (Addendum)
Anesthesia Evaluation  Patient identified by MRN, date of birth, ID band Patient awake    Reviewed: Allergy & Precautions, H&P , NPO status , Patient's Chart, lab work & pertinent test results, reviewed documented beta blocker date and time   History of Anesthesia Complications (+) PONV  Airway Mallampati: II TM Distance: >3 FB     Dental  (+) Teeth Intact and Dental Advisory Given   Pulmonary shortness of breath and with exertion,  clear to auscultation  Pulmonary exam normal       Cardiovascular Exercise Tolerance: Poor hypertension, Pt. on medications + CAD (Prior MI in 1984; followed by cardiac cath with good LV function and minimal CAD) and + Past MI Regular Normal    Neuro/Psych  Headaches, PSYCHIATRIC DISORDERS Anxiety Depression    GI/Hepatic Neg liver ROS, GERD-  Medicated,  Endo/Other  Diabetes mellitus-, Type 2, Oral Hypoglycemic AgentsHypothyroidism   Renal/GU negative Renal ROS     Musculoskeletal negative musculoskeletal ROS (+)   Abdominal (+) obese,   Peds  Hematology negative hematology ROS (+)   Anesthesia Other Findings   Reproductive/Obstetrics                         Anesthesia Physical Anesthesia Plan  ASA: III  Anesthesia Plan: General   Post-op Pain Management:    Induction: Intravenous  Airway Management Planned: LMA  Additional Equipment:   Intra-op Plan:   Post-operative Plan:   Informed Consent:   Dental advisory given  Plan Discussed with: CRNA  Anesthesia Plan Comments:         Anesthesia Quick Evaluation

## 2012-01-27 ENCOUNTER — Encounter (HOSPITAL_BASED_OUTPATIENT_CLINIC_OR_DEPARTMENT_OTHER): Payer: Self-pay | Admitting: Urology

## 2012-02-04 ENCOUNTER — Other Ambulatory Visit: Payer: Self-pay | Admitting: Urology

## 2012-02-08 ENCOUNTER — Encounter (HOSPITAL_COMMUNITY): Payer: Self-pay | Admitting: Pharmacy Technician

## 2012-02-09 ENCOUNTER — Encounter (HOSPITAL_COMMUNITY): Payer: Self-pay | Admitting: *Deleted

## 2012-02-09 NOTE — Progress Notes (Signed)
Pt instructed to arrive 0800 to short stay on 02/11/12. NPO after midnight. To hold metformin AM of procedure. To take her toprol and synthroid. Pt is extremely anxious about procedure as she was able to feel everything during previous ESWLs. RN explains an Mining engineer will be on truck with her to sedate her during procedure. Pt states she will want something to calm her down while in short stay. Pt instructed no aspirin or NSAIDS until after procedure. Her husband heard all instructions over speaker phone.

## 2012-02-11 ENCOUNTER — Ambulatory Visit (HOSPITAL_COMMUNITY): Payer: Medicare Other | Admitting: *Deleted

## 2012-02-11 ENCOUNTER — Encounter (HOSPITAL_COMMUNITY): Payer: Self-pay | Admitting: *Deleted

## 2012-02-11 ENCOUNTER — Ambulatory Visit (HOSPITAL_COMMUNITY)
Admission: RE | Admit: 2012-02-11 | Discharge: 2012-02-11 | Disposition: A | Discharge status: Home / Self Care | Payer: Medicare Other | Source: Ambulatory Visit | Attending: Urology | Admitting: Urology

## 2012-02-11 ENCOUNTER — Encounter (HOSPITAL_COMMUNITY)
Admission: RE | Disposition: A | Discharge status: Home / Self Care | Payer: Self-pay | Source: Ambulatory Visit | Attending: Urology

## 2012-02-11 ENCOUNTER — Ambulatory Visit (HOSPITAL_COMMUNITY): Payer: Medicare Other

## 2012-02-11 DIAGNOSIS — I10 Essential (primary) hypertension: Secondary | ICD-10-CM | POA: Insufficient documentation

## 2012-02-11 DIAGNOSIS — N201 Calculus of ureter: Secondary | ICD-10-CM

## 2012-02-11 DIAGNOSIS — E119 Type 2 diabetes mellitus without complications: Secondary | ICD-10-CM | POA: Insufficient documentation

## 2012-02-11 LAB — GLUCOSE, CAPILLARY
Glucose-Capillary: 98 mg/dL (ref 70–99)
Glucose-Capillary: 108 mg/dL — ABNORMAL HIGH (ref 70–99)

## 2012-02-11 SURGERY — LITHOTRIPSY, ESWL
Anesthesia: General | Laterality: Right

## 2012-02-11 MED ORDER — HYDROCODONE-ACETAMINOPHEN 10-300 MG PO TABS: 1.0000 | ORAL_TABLET | Freq: Four times a day (QID) | ORAL | Status: DC | PRN

## 2012-02-11 MED ORDER — CIPROFLOXACIN HCL 500 MG PO TABS
ORAL_TABLET | ORAL | Status: AC
  Filled 2012-02-11: qty 1

## 2012-02-11 MED ORDER — TAMSULOSIN HCL 0.4 MG PO CAPS: 0.4000 mg | ORAL_CAPSULE | ORAL | Status: DC

## 2012-02-11 MED ORDER — LACTATED RINGERS IV SOLN: INTRAVENOUS | Status: DC

## 2012-02-11 MED ORDER — CIPROFLOXACIN HCL 500 MG PO TABS
500.0000 mg | ORAL_TABLET | ORAL | Status: AC
  Administered 2012-02-11: 500 mg via ORAL

## 2012-02-11 MED ORDER — SODIUM CHLORIDE 0.45 % IV SOLN: INTRAVENOUS | Status: DC

## 2012-02-11 MED ORDER — DIAZEPAM 5 MG PO TABS: 10.0000 mg | ORAL_TABLET | ORAL | Status: DC

## 2012-02-11 MED ORDER — DIPHENHYDRAMINE HCL 25 MG PO CAPS: 25.0000 mg | ORAL_CAPSULE | ORAL | Status: DC

## 2012-02-11 NOTE — Progress Notes (Signed)
C/O pain and feels like something in left eye CRNA notified. States due to O2 blowing into eye from mask .

## 2012-02-11 NOTE — Transfer of Care (Signed)
Immediate Anesthesia Transfer of Care Note  Patient: [name] [name]  Procedure(s) Performed: Procedure(s) (LRB): EXTRACORPOREAL SHOCK WAVE LITHOTRIPSY (ESWL) (Right)  Patient Location: Short Stay  Anesthesia Type: MAC  Level of Consciousness: awake, alert  and oriented  Airway & Oxygen Therapy: Patient Spontanous Breathing  Post-op Assessment: Report given to PACU RN and Post -op Vital signs reviewed and stable  Post vital signs: Reviewed and stable  Complications: No apparent anesthesia complications

## 2012-02-11 NOTE — Discharge Instructions (Signed)
Lithotripsy Care After Refer to this sheet for the next few weeks. These discharge instructions provide you with general information on caring for yourself after you leave the hospital. Your caregiver may also give you specific instructions. Your treatment has been planned according to the most current medical practices available, but unavoidable complications sometimes occur. If you have any problems or questions after discharge, please call your caregiver. AFTER THE PROCEDURE   The recovery time will vary with the procedure done.   You will be taken to the recovery area. A nurse will watch and check your progress. Once you are awake, stable, and taking fluids well, you will be allowed to go home as long as there are no problems.   Your urine may have a red tinge for a few days after treatment. Blood loss is usually minimal.   You may have soreness in the back or flank area. This usually goes away after a few days. The procedure can cause blotches or bruises on the back where the pressure wave enters the skin. These marks usually cause only minimal discomfort and should disappear in a short time.   Stone fragments should begin to pass within 24 hours of treatment. However, a delayed passage is not unusual.   You may have pain, discomfort, and feel sick to your stomach (nauseous) when the crushed (pulverized) fragments of stone are passed down the tube from the kidney to the bladder. Stone fragments can pass soon after the procedure and may last for up to 4 to 8 weeks.   A small number of patients may have severe pain when stone fragments are not able to pass, which leads to an obstruction.   If your stone is greater than 1 inch/2.5 centimeters in diameter or if you have multiple stones that have a combined diameter greater than 1 inch/2.5 centimeters, you may require more than 1 treatment.   You must have someone drive you home.  HOME CARE INSTRUCTIONS   Rest at home until you feel your  energy improving.   Only take over-the-counter or prescription medicines for pain, discomfort, or fever as directed by your caregiver. Depending on the type of lithotripsy, you may need to take medicines (antibiotics) that kill germs and anti-inflammatory medicines for a few days.   Drink enough water and fluids to keep your urine clear or pale yellow. This helps "flush" your kidneys. It helps pass any remaining pieces of stone and prevents stones from coming back.   Most people can resume daily activities within 1 or 2 days after standard lithotripsy. It can take longer to recover from laser and percutaneous lithotripsy.   If the stones are in your urinary system, you may be asked to strain your urine at home to look for stones. Any stones that are found can be sent to a medical lab for examination.   Visit your caregiver for a follow-up appointment in a few weeks. Your doctor may remove your stent if you have one. Your caregiver will also check to see whether stone particles still remain.  SEEK MEDICAL CARE IF:   You have an oral temperature above 102 F (38.9 C).   Your pain is not relieved by medicine.   You have a lasting nauseous feeling.   You feel there is too much blood in the urine.   You develop persistent problems with frequent and/or painful urination that does not at least partially improve after 2 days following the procedure.   You have a congested   cough.  °· You feel lightheaded.  °· You develop a rash or any other signs that might suggest an allergic problem.  °· You develop any reaction or side effects to your medicine(s).  °SEEK IMMEDIATE MEDICAL CARE IF:  °· You experience severe back and/or flank pain.  °· You see nothing but blood when you urinate.  °· You cannot pass any urine at all.  °· You have an oral temperature above 102° F (38.9° C), not controlled by medicine.  °· You develop shortness of breath, difficulty breathing, or chest pain.  °· You develop vomiting  that will not stop after 6 to 8 hours.  °· You have a fainting episode.  °MAKE SURE YOU:  °· Understand these instructions.  °· Will watch your condition.  °· Will get help right away if you are not doing well or get worse.  °Document Released: 01/03/2008 Document Revised: 08/12/2011 Document Reviewed: 01/03/2008 °[employer]® Patient Information ©2012 [employer]. °

## 2012-02-11 NOTE — Preoperative (Signed)
Beta Blockers   Reason not to administer Beta Blockers:Pt took metoprolol this am 

## 2012-02-11 NOTE — Progress Notes (Signed)
Large inflamed area Rt hip

## 2012-02-11 NOTE — Op Note (Signed)
See [facility] OP note scanned into chart. 

## 2012-02-11 NOTE — Anesthesia Preprocedure Evaluation (Signed)
Anesthesia Evaluation  Patient identified by MRN, date of birth, ID band Patient awake    Reviewed: Allergy & Precautions, H&P , NPO status , Patient's Chart, lab work & pertinent test results, reviewed documented beta blocker date and time   History of Anesthesia Complications (+) PONV  Airway Mallampati: II TM Distance: >3 FB Neck ROM: full    Dental No notable dental hx. (+) Teeth Intact and Dental Advisory Given   Pulmonary neg pulmonary ROS,  clear to auscultation  Pulmonary exam normal       Cardiovascular Exercise Tolerance: Good hypertension, Pt. on home beta blockers and Pt. on medications + CAD, + Past MI and neg cardio ROS regular Normal stess test 4/12 no ischemia but prior ant. MI.  No CP   Neuro/Psych Anxiety Depression Negative Neurological ROS  Negative Psych ROS   GI/Hepatic negative GI ROS, Neg liver ROS, GERD-  Medicated and Controlled,  Endo/Other  Negative Endocrine ROSDiabetes mellitus-, Well Controlled, Type 2, Oral Hypoglycemic AgentsHypothyroidism   Renal/GU negative Renal ROS  Genitourinary negative   Musculoskeletal   Abdominal   Peds  Hematology negative hematology ROS (+)   Anesthesia Other Findings   Reproductive/Obstetrics negative OB ROS                           Anesthesia Physical Anesthesia Plan  ASA: III  Anesthesia Plan: General   Post-op Pain Management:    Induction: Intravenous  Airway Management Planned: Simple Face Mask  Additional Equipment:   Intra-op Plan:   Post-operative Plan:   Informed Consent: I have reviewed the patients History and Physical, chart, labs and discussed the procedure including the risks, benefits and alternatives for the proposed anesthesia with the patient or authorized representative who has indicated his/her understanding and acceptance.   Dental Advisory Given  Plan Discussed with: CRNA and  Surgeon  Anesthesia Plan Comments:         Anesthesia Quick Evaluation

## 2012-02-11 NOTE — H&P (Signed)
History of Present Illness       66 y/o white female with h/o nephrolithiasis presents for follow up s/p recent cystoureteroscopy, R RGP, HLL and right DJ stent placement 01/25/12.  She was recently seen in the office 01/29/12 reporting that since Wed. 01/27/12, she has noticed "a tube" protruding from the vagina.  She has had significant incontinence without sensory awareness, suprapubic discomfort and right flank pain since that time and this is progressively worsening.  She denies dysuria but has continued with gross hematuria.  Currently, her pain is 8/10 in severity and described as right flank pressure radiating into the RLQ abd.  She has associated nausea and malaise.  She denies fever or chills.  She has been using Vesicare for incontinence without benefit.  She has been using Vicodin prn pain with minimal if any relief.  Interval Hx: Her DJ stent was removed in the office 01/29/12. She had a right renal U/S which demonstrated persistent moderate hydroureteronephrosis with a hyperdense area noted in the proximal ureter measuring 2cm with shadowing.  She was given IM Toradal and Phenergan but reports that she had little relief with these.  She has been using Dilaudid prn which helps.  She presented back to the office 02/01/12 with c/o LUTS with mental status changes and was noted to have UTI.  Urine C&S returned EColi, sensitive to Levaquin which she was prescribed.  She has continued with the Levaquin and reports that overall she is feeling much better.  She continues with intermittent mild RLQ and right flank pain but this is gradually improving.  Her LUTS are improving on abx.  She denies gross hematuria, fever or chills.  She returns today for follow up renal U/S.   Past Medical History Problems  1. History of  Acute Myocardial Infarction V12.59 2. History of  Anxiety (Symptom) 300.00 3. History of  Arthritis V13.4 4. History of  Bladder Calculus V13.01 5. History of  Depression 311 6. History of   Diabetes Mellitus 250.00 7. History of  Heartburn 787.1 8. History of  Hypercholesterolemia 272.0 9. History of  Ureteral Stone Left 592.1 10. History of  Ureteral Stone Right 592.1  Surgical History Problems  1. History of  Cystoscopy With Insertion Of Ureteral Stent Left Left 2. History of  Cystoscopy With Insertion Of Ureteral Stent Left 3. History of  Cystoscopy With Insertion Of Ureteral Stent Right 4. History of  Cystoscopy With Pyeloscopy With Lithotripsy 5. History of  Cystoscopy With Ureteroscopy With Lithotripsy Left 6. History of  Cystoscopy With Ureteroscopy With Lithotripsy 7. History of  Cystoscopy With Ureteroscopy With Removal Of Calculus Left 8. History of  Cystoscopy With Ureteroscopy With Removal Of Calculus 9. History of  Lithotripsy - Whole Body (Extracorporeal Shock Wave) Left 10. History of  Lithotripsy - Whole Body (Extracorporeal Shock Wave) Left  Current Meds 1. Aspirin 81 MG Oral Tablet; Therapy: (Recorded:05Dec2007) to 2. Clindamycin HCl 150 MG Oral Capsule; Therapy: 13Mar2012 to 3. Diltiazem HCl ER Coated Beads 120 MG Oral Capsule Extended Release 24 Hour; Therapy:  27Apr2012 to 4. Doxycycline Hyclate 100 MG Oral Tablet; Therapy: 15Jan2013 to 5. FLUoxetine HCl TABS; Therapy: (Recorded:05Dec2007) to 6. Furosemide TABS; Therapy: (Recorded:06Jul2009) to 7. HYDROmorphone HCl 4 MG Oral Tablet; TAKE 1 TABLET EVERY 4 TO 6 HOURS AS NEEDED  FOR PAIN; Therapy: 01Feb2013 to (Complete:08Feb2013); Last Rx:01Feb2013 8. Isometheptene-APAP-Dichloral 65-325-100 MG Oral Capsule; Therapy: 17Aug2012 to 9. Levofloxacin 500 MG Oral Tablet; TAKE 1 TABLET DAILY UNTIL FINISHED; Therapy: 04Feb2013  to (  Evaluate:14Feb2013)  Requested for: 04Feb2013; Last Rx:04Feb2013 10. Lipitor 40 MG Oral Tablet; Therapy: (Recorded:24May2010) to 11. Losartan Potassium 25 MG Oral Tablet; Therapy: 03May2012 to 12. MetFORMIN HCl 1000 MG Oral Tablet; Therapy: (Recorded:24May2010) to 13. Metoprolol  Succinate ER 25 MG Oral Tablet Extended Release 24 Hour; Therapy:   (Recorded:24May2010) to 14. Omeprazole 20 MG Oral Capsule Delayed Release; Therapy: 27Apr2012 to 15. Synthroid 150 MCG Oral Tablet; Therapy: (Recorded:05Dec2007) to 16. VESIcare 10 MG Oral Tablet; TAKE 1 TABLET DAILY; Therapy: 29Jan2013 to   (Evaluate:28Feb2013)  Requested for: 29Jan2013; Last Rx:29Jan2013 17. Vitamin D (Ergocalciferol) 50000 UNIT Oral Capsule; Therapy: 17Jan2012 to 18. Vitamin D TABS; Therapy: (Recorded:05Dec2007) to 19. Xanax 0.5 MG Oral Tablet; Therapy: (Recorded:05Dec2007) to 20. Zetia 10 MG Oral Tablet; Therapy: (Recorded:05Dec2007) to  Allergies Medication  1. Cephalosporins  Social History Problems  1. Never A Smoker  Review of Systems Genitourinary, constitutional, skin, eye, otolaryngeal, hematologic/lymphatic, cardiovascular, pulmonary, endocrine, musculoskeletal, gastrointestinal, neurological and psychiatric system(s) were reviewed and pertinent findings if present are noted.  Genitourinary: urinary frequency, urinary urgency, dysuria, urinary hesitancy, incomplete emptying of bladder and suprapubic pain.  Gastrointestinal: flank pain.    Vitals Vital Signs  Blood Pressure: 110 / 65 Temperature: 97.8 F Heart Rate: 64  Physical Exam Constitutional: Well nourished and well developed . No acute distress.  ENT:. The ears and nose are normal in appearance.  Neck: The appearance of the neck is normal and no neck mass is present.  Pulmonary: No respiratory distress and normal respiratory rhythm and effort.  Cardiovascular:. No peripheral edema.  Skin: Normal skin turgor, no visible rash and no visible skin lesions.  Neuro/Psych:. Mood and affect are appropriate.    Results/Data Urine    COLOR GREEN   APPEARANCE CLEAR   SPECIFIC GRAVITY 1.020   pH 7.0   GLUCOSE NEG mg/dL  BILIRUBIN NEG   KETONE NEG mg/dL  BLOOD SMALL   PROTEIN 100 mg/dL  UROBILINOGEN 0.2 mg/dL  NITRITE NEG     LEUKOCYTE ESTERASE NEG   SQUAMOUS EPITHELIAL/HPF FEW   WBC 11-20 WBC/hpf  RBC 7-10 RBC/hpf  BACTERIA RARE   CRYSTALS Calcium Oxalate crystals noted   CASTS Hyaline casts noted    The following images/tracing/specimen were independently visualized: .     Right Renal U/S demonstrates persistent right hydroureteronephrosis which may now be chronic due to some silent obstruction with recent ureteral stones.  The hydro has not progressed.  There are 2 calcifications within the RLP.  PVR is 8.17ml.  See U/S worksheet for full details.  KUB demonstrates bilateral renal stones as well as what appears to be a 9mm right midureteral stone with an accumulation of stone fragments just distal to that vs one large stone measuring 2cm.    Assessment Assessed  1. Hydronephrosis On The Right 591 2. Nephrolithiasis Of Both Kidneys 592.0 3. Mid Ureteral Stone On The Right 592.1  Plan    Will schedule right ESWL.  Orders completed. Hydrocodone prn pain. Continue Levaquin as prescribed.

## 2012-02-12 ENCOUNTER — Ambulatory Visit (HOSPITAL_COMMUNITY)
Admission: EM | Admit: 2012-02-12 | Discharge: 2012-02-13 | Disposition: A | Discharge status: Home / Self Care | Payer: Medicare Other | Attending: Urology | Admitting: Urology

## 2012-02-12 ENCOUNTER — Encounter (HOSPITAL_COMMUNITY): Payer: Self-pay | Admitting: *Deleted

## 2012-02-12 DIAGNOSIS — N200 Calculus of kidney: Secondary | ICD-10-CM

## 2012-02-12 DIAGNOSIS — E785 Hyperlipidemia, unspecified: Secondary | ICD-10-CM | POA: Insufficient documentation

## 2012-02-12 DIAGNOSIS — I252 Old myocardial infarction: Secondary | ICD-10-CM | POA: Insufficient documentation

## 2012-02-12 DIAGNOSIS — I2510 Atherosclerotic heart disease of native coronary artery without angina pectoris: Secondary | ICD-10-CM | POA: Insufficient documentation

## 2012-02-12 DIAGNOSIS — E039 Hypothyroidism, unspecified: Secondary | ICD-10-CM | POA: Insufficient documentation

## 2012-02-12 DIAGNOSIS — E669 Obesity, unspecified: Secondary | ICD-10-CM | POA: Insufficient documentation

## 2012-02-12 DIAGNOSIS — I10 Essential (primary) hypertension: Secondary | ICD-10-CM | POA: Insufficient documentation

## 2012-02-12 DIAGNOSIS — N201 Calculus of ureter: Secondary | ICD-10-CM | POA: Insufficient documentation

## 2012-02-12 DIAGNOSIS — D72829 Elevated white blood cell count, unspecified: Secondary | ICD-10-CM | POA: Insufficient documentation

## 2012-02-12 DIAGNOSIS — N179 Acute kidney failure, unspecified: Secondary | ICD-10-CM | POA: Insufficient documentation

## 2012-02-12 DIAGNOSIS — N1330 Unspecified hydronephrosis: Secondary | ICD-10-CM | POA: Insufficient documentation

## 2012-02-12 DIAGNOSIS — E119 Type 2 diabetes mellitus without complications: Secondary | ICD-10-CM | POA: Insufficient documentation

## 2012-02-12 DIAGNOSIS — K219 Gastro-esophageal reflux disease without esophagitis: Secondary | ICD-10-CM | POA: Insufficient documentation

## 2012-02-12 DIAGNOSIS — R109 Unspecified abdominal pain: Secondary | ICD-10-CM | POA: Insufficient documentation

## 2012-02-12 DIAGNOSIS — Z79899 Other long term (current) drug therapy: Secondary | ICD-10-CM | POA: Insufficient documentation

## 2012-02-12 MED ORDER — ONDANSETRON HCL 4 MG/2ML IJ SOLN
4.0000 mg | Freq: Once | INTRAMUSCULAR | Status: AC
  Administered 2012-02-13: 4 mg via INTRAVENOUS
  Filled 2012-02-12: qty 2

## 2012-02-12 MED ORDER — HYDROMORPHONE HCL PF 1 MG/ML IJ SOLN
1.0000 mg | Freq: Once | INTRAMUSCULAR | Status: AC
  Administered 2012-02-13: 1 mg via INTRAVENOUS
  Filled 2012-02-12: qty 1

## 2012-02-12 NOTE — Anesthesia Postprocedure Evaluation (Signed)
  Anesthesia Post-op Note  Patient: [name] [name]  Procedure(s) Performed: Procedure(s) (LRB): EXTRACORPOREAL SHOCK WAVE LITHOTRIPSY (ESWL) (Right)  Patient Location: PACU  Anesthesia Type: MAC  Level of Consciousness: awake and alert   Airway and Oxygen Therapy: Patient Spontanous Breathing  Post-op Pain: mild  Post-op Assessment: Post-op Vital signs reviewed, Patient's Cardiovascular Status Stable, Respiratory Function Stable, Patent Airway and No signs of Nausea or vomiting  Post-op Vital Signs: stable  Complications: No apparent anesthesia complications

## 2012-02-12 NOTE — ED Provider Notes (Addendum)
History     CSN: [id number]  Arrival date & time 02/12/12  2244   First MD Initiated Contact with Patient 02/12/12 2335      Chief Complaint  Patient presents with  . Nephrolithiasis    (Consider location/radiation/quality/duration/timing/severity/associated sxs/prior treatment) HPI Comments: Patient had lithotripsy done yesterday for a large stone. She was passing stones today without problems and then developed severe right-sided pain. The pain is a 10 out of 10 sharp and jabbing in nature and occurs in the right flank and radiates to the right groin.  Patient is a 66 y.o. female presenting with flank pain. The history is provided by the patient.  Flank Pain This is a recurrent problem. The current episode started 3 to 5 hours ago. The problem occurs constantly. The problem has been rapidly worsening. Associated symptoms include abdominal pain. Pertinent negatives include no chest pain. Associated symptoms comments: No fever. Vomiting, nausea. No shortness of breath.. The symptoms are aggravated by nothing. The symptoms are relieved by nothing. Treatments tried: Narcotics and nausea control. The treatment provided no relief.    Past Medical History  Diagnosis Date  . Ischemic heart disease   . Diabetes mellitus   . Obesity   . Chronic anxiety   . Nephrolithiasis   . Lumbar herniated disc   . Depression   . Situational stress   . Hyperlipemia   . History of bronchitis   . History of pneumonia   . Hypothyroidism   . Coronary artery disease CARDIOLOGIST- DR Maylon Cos-- VISIT 04-01-2011 W/ CHART AND IN EPIC  . History of kidney stones S/P URETEROSCOPIC STONE EXTRACTIONS  . Right ureteral calculus   . 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  NO ISCHEMIA. SUSPECT THIS REPRESENTS PRIOR MI BUT PATTERN UNUSUAL GIVEN WORSE DEFECT AT REST. APICAL HYPODINESIS BUT OVERALL PRESERVED EF.    Marland Kitchen History of acute anterior wall MI MARCH 1984    S/P ANGIOPLASTY AND THROMBOLYTICS  . History of hypercalcemia   . History of echocardiogram 05-20-2007    NORMAL LVSF. DISTAL SEPTAL HYPOKINESIA. MILD LEVH W/ IMPAIRED LV RELAXATION. MILD AORTIC SCLEROSIS. MILD TRICUSPID REGURGITATION  . Arthritis   . GERD (gastroesophageal reflux disease)   . PONV (postoperative nausea and vomiting)   . Panic attack as reaction to stress   . Female bladder prolapse   . Urge urinary incontinence   . Myocardial infarction     1984 MI with angioplasty    Past Surgical History  Procedure Date  . Left ureteroscopic laser litho/ stone extraction 12-16-2009  &  11-17-2007  . Right ureteroscopic stone extraction  07-04-2008  . Coronary angioplasty 1984  . Cardiac catheterization 04-18-2008; 05-08-2004;  11-29-1986; 08-19-1988    2009 REPORT---- MILD ANTERIOR HYPOKINESIS (FROM REMOTE MI), MILD CORONARY ATHEROSCLEROSIS W/ 30-40% NARROWING IN THE MID LAD & 50% NARROWING IN THE SMALL OBTUSE MARGINAL,  NORMAL RENAL ARTERIES W/ NO ABD. AORTIC DISEASE  . Carpal tunnel release 2005    RIGHT  . Extracorporeal shock wave lithotripsy 2007    X2  . Facial cosmetic surgery   . Ureteroscopy 01/25/2012    Procedure: URETEROSCOPY;  Surgeon: Garnett Farm, MD;  Location: [facility];  Service: Urology;  Laterality: Right;    Family History  Problem Relation Age of Onset  . Heart disease Mother   . Heart attack Father   . Heart disease Brother  History  Substance Use Topics  . Smoking status: Former Smoker    Types: Cigarettes    Quit date: 12/28/1982  . Smokeless tobacco: Never Used  . Alcohol Use: Yes     socially    OB History    Grav Para Term Preterm Abortions TAB SAB Ect Mult Living                  Review of Systems  Constitutional: Negative for fever, chills and diaphoresis.  Respiratory: Negative for cough, chest tightness and wheezing.   Cardiovascular: Negative for chest  pain and leg swelling.  Gastrointestinal: Positive for nausea, vomiting and abdominal pain. Negative for diarrhea.  Genitourinary: Positive for flank pain.  All other systems reviewed and are negative.    Allergies  Keflex  Home Medications   Current Outpatient Rx  Name Route Sig Dispense Refill  . ACETAMINOPHEN 500 MG PO TABS Oral Take 1,000 mg by mouth every 6 (six) hours as needed. Pain     . ALPRAZOLAM 0.5 MG PO TABS Oral Take 1 mg by mouth. ONE TABLET IN AM, TWO TABLETS IN PM AND PRN X1 DURING THE DAY    . ASPIRIN 81 MG PO TABS Oral Take 81 mg by mouth every morning.     . ATORVASTATIN CALCIUM 40 MG PO TABS Oral Take 40 mg by mouth every evening.     Marland Kitchen DILTIAZEM HCL ER COATED BEADS 120 MG PO CP24 Oral Take 120 mg by mouth every evening.     Marland Kitchen EZETIMIBE 10 MG PO TABS Oral Take 10 mg by mouth at bedtime.     . FLUOXETINE HCL 20 MG PO CAPS Oral Take 20 mg by mouth daily with breakfast.     . HYDROCODONE-ACETAMINOPHEN 5-325 MG PO TABS Oral Take 1 tablet by mouth every 4 (four) hours as needed.    Marland Kitchen HYDROCODONE-ACETAMINOPHEN 10-660 MG PO TABS Oral Take 660 mg by mouth every 4 (four) hours.    . IBUPROFEN 200 MG PO TABS Oral Take 400 mg by mouth every 6 (six) hours as needed. Pain     . ISOMETHEPTENE-APAP-DICHLORAL 65-325-100 MG PO CAPS Oral Take 1 capsule by mouth 4 (four) times daily as needed. Headache     . LEVOTHYROXINE SODIUM 150 MCG PO TABS Oral Take 150 mcg by mouth every morning.     Marland Kitchen LOSARTAN POTASSIUM 25 MG PO TABS Oral Take 25 mg by mouth at bedtime.     Marland Kitchen METFORMIN HCL 1000 MG PO TABS Oral Take 1,000 mg by mouth 2 (two) times daily.     Marland Kitchen METOPROLOL SUCCINATE ER 25 MG PO TB24 Oral Take 25 mg by mouth every morning.     Marland Kitchen OMEPRAZOLE 20 MG PO CPDR Oral Take 20 mg by mouth daily as needed. Heart burn     . POLYETHYL GLYCOL-PROPYL GLYCOL 0.4-0.3 % OP SOLN Both Eyes Place 1 drop into both eyes 2 (two) times daily as needed. Dry eyes    . TAMSULOSIN HCL 0.4 MG PO CAPS Oral  Take 1 capsule (0.4 mg total) by mouth daily after supper. 30 capsule 11  . VITAMIN D (ERGOCALCIFEROL) 50000 UNITS PO CAPS Oral Take 50,000 Units by mouth every 7 (seven) days. Friday    . FUROSEMIDE 40 MG PO TABS Oral Take 20 mg by mouth daily as needed. Fluid     . NITROGLYCERIN 0.4 MG SL SUBL Sublingual Place 0.4 mg under the tongue every 5 (five) minutes as needed. Chest pain  BP 160/75  Pulse 73  Temp(Src) 97.4 F (36.3 C) (Oral)  Resp 22  SpO2 97%  Physical Exam  Nursing note and vitals reviewed. Constitutional: She is oriented to person, place, and time. She appears well-developed and well-nourished. She appears distressed.  HENT:  Head: Normocephalic and atraumatic.  Mouth/Throat: Mucous membranes are dry.  Eyes: EOM are normal. Pupils are equal, round, and reactive to light.  Cardiovascular: Normal rate, regular rhythm, normal heart sounds and intact distal pulses.  Exam reveals no friction rub.   No murmur heard. Pulmonary/Chest: Effort normal and breath sounds normal. She has no wheezes. She has no rales.  Abdominal: Soft. Bowel sounds are normal. She exhibits no distension. There is tenderness. There is CVA tenderness. There is no rebound and no guarding.  Musculoskeletal: Normal range of motion. She exhibits no tenderness.       No edema  Neurological: She is alert and oriented to person, place, and time. No cranial nerve deficit.  Skin: Skin is warm and dry. No rash noted.  Psychiatric: She has a normal mood and affect. Her behavior is normal.    ED Course  Procedures (including critical care time)  Labs Reviewed  CBC - Abnormal; Notable for the following:    WBC 17.0 (*)    Hemoglobin 11.7 (*)    HCT 34.9 (*)    All other components within normal limits  DIFFERENTIAL - Abnormal; Notable for the following:    Neutrophils Relative 85 (*)    Neutro Abs 14.4 (*)    Lymphocytes Relative 9 (*)    All other components within normal limits  BASIC METABOLIC  PANEL - Abnormal; Notable for the following:    Sodium 133 (*)    Glucose, Bld 155 (*)    BUN 24 (*)    Creatinine, Ser 1.32 (*)    Calcium 10.6 (*)    GFR calc non Af Amer 41 (*)    GFR calc Af Amer 47 (*)    All other components within normal limits  URINALYSIS, ROUTINE W REFLEX MICROSCOPIC - Abnormal; Notable for the following:    APPearance CLOUDY (*)    Specific Gravity, Urine 1.031 (*)    Hgb urine dipstick MODERATE (*)    Protein, ur 30 (*)    All other components within normal limits  URINE MICROSCOPIC-ADD ON - Abnormal; Notable for the following:    Bacteria, UA MANY (*)    Crystals CA OXALATE CRYSTALS (*)    All other components within normal limits   Ct Abdomen Pelvis Wo Contrast  02/13/2012  *RADIOLOGY REPORT*  Clinical Data: History kidney stones and recent lithotripsy.  CT ABDOMEN AND PELVIS WITHOUT CONTRAST  Technique:  Multidetector CT imaging of the abdomen and pelvis was performed following the standard protocol without intravenous contrast.  Comparison: Abdominal radiograph 02/13/2012 and CT from 05/24/2010  Findings: The lung bases are clear.  There is no evidence for free air.  There is severe right hydroureteronephrosis.  The hydronephrosis is secondary to stones in the mid/distal right ureter.  This may represent multiple stones with the largest stone measuring up to 1 cm.  The distal ureter is filled with stones.  There are stones at the right ureterovesical junction and two stones in the bladder. There are 2 or 3 small stones in the right kidney lower pole. There is right perinephric stranding related to the hydroureteronephrosis.  There are at least three stones in the left kidney.  The largest measures 3 mm  in the upper pole.  No evidence for left hydronephrosis.  No gross abnormality of the liver, gallbladder, spleen, pancreas or adrenal glands.  Again seen are prominent lymph nodes in the periaortic region that are similar to the exam in 2011.  No gross abnormality  involving the uterus or adnexa tissue.  Colonic diverticulosis involving the left colon without acute colonic inflammation.  Normal appearance of the appendix. No acute bony abnormality.  IMPRESSION: Severe right hydroureteronephrosis.  There is an obstructing stone(s) measuring roughly 1 cm in the mid/distal right ureter.  The distal right ureter and right ureterovesical junction are filled with stones.  Bilateral kidney stones. Urinary bladder stones.  Original Report Authenticated By: Richarda Overlie, M.D.   Dg Abd 1 View  02/13/2012  *RADIOLOGY REPORT*  Clinical Data: Nausea and dry heaves; right-sided abdominal pain. Status post lithotripsy 1 day ago.  ABDOMEN - 1 VIEW  Comparison: Abdominal radiograph performed 02/11/2012  Findings: The previously noted large stone along the course of the right ureter is not definitely characterized, but cannot be excluded given apparent focal density at the same location.  This is not well assessed due to the increased amount of stool throughout the colon.  Left-sided nephrolithiasis again noted; right-sided nephrolithiasis is less well characterized.  No acute osseous abnormalities are identified.  No free intra-abdominal air is identified, though evaluation for free air is suboptimal on supine views.  IMPRESSION:  1.  Previously noted large stone along the course of the right ureter cannot be excluded given apparent focal density at the same location, but is not definitely delineated.  This may simply reflect overlying stool. 2.  Increased stool burden likely reflects mild constipation. 3.  Left-sided nephrolithiasis noted.  Original Report Authenticated By: Tonia Ghent, M.D.   Dg Abd 1 View - Morning Of Procedure  02/11/2012  *RADIOLOGY REPORT*  Clinical Data: 66 year old female with right side urologic calculus.  Planned lithotripsy.  ABDOMEN - 1 VIEW  Comparison: [facility] KUB 02/04/2012 and earlier. [facility] KUB 12/02/2010 and CT abdomen  pelvis 05/24/2010.  Findings: Recently seen calculus projecting over the mid to lower right renal shadow is no longer identified.  also, compared to the 2011 CT and KUB, an oblong calcification projecting just above the right sacral ala and measuring 10 x 20 mm may be within the distal right ureter, previously was seen in the mid right collecting system.  Left upper pole and lower pole region calculi are stable. Nonobstructed bowel gas pattern. No acute osseous abnormality identified.  Visualized lung bases are clear.  IMPRESSION: 1.  Coarse oblong calcification measuring 10 x 20 mm just above the right sacral ala is suspected to be in the right ureter, having migrated from the right renal collecting system since 2011. 2.  Around 6 mm calculus projecting over the lower right kidney on the recent to 07/13 KUB is not delineated today. 3.  Stable smaller left nephrolithiasis.  Original Report Authenticated By: Ulla Potash III, M.D.     1. Hydronephrosis of right kidney   2. Kidney stone       MDM   Patient with a history of kidney stones. She had lithotripsy done yesterday and today was passing moderate size stones and then developed severe right-sided flank pain and intermittent feelings of urinary retention. patient is able to urinate at this time but is extremely uncomfortable in the bed. She has right CVA tenderness. No suprapubic tenderness, fever, vomiting at this time. Patient was vomiting at  home and appears dehydrated. Concern for possible right-sided obstruction.  Doubt urinary retention this patient is able to urinate. CBC, BMP, UA, CT stone study pending. Patient given IV hydration and pain control.   1:56 AM Patient with a leukocytosis of 17,000. Creatinine 1.3. UA negative for infection. Plain film is unclear whether the stone was still there are not. Will get CT to further evaluate. After 2 rounds of Dilaudid, Phenergan, Zofran, Toradol the patient's pain is improved.   3:47 AM Signs  of ureteral obstruction and severe hydronephrosis which may need nephrostomy tube. Spoke with urology and they will come in and see the patient.  Gwyneth Sprout, MD 02/13/12 1610  Gwyneth Sprout, MD 02/13/12 9604  Gwyneth Sprout, MD 02/13/12 5409

## 2012-02-12 NOTE — ED Notes (Signed)
Pt has known kidney stones for which she received lithotripsy treatment x 1 day ago.  Pt states that today she began to experience urinary retention and nausea.  Pt states her pain is in her right flank area and is rates @ 10/10.

## 2012-02-13 ENCOUNTER — Emergency Department (HOSPITAL_COMMUNITY): Payer: Medicare Other

## 2012-02-13 LAB — CBC
Platelets: 319 10*3/uL (ref 150–400)
RDW: 12.8 % (ref 11.5–15.5)
WBC: 17 10*3/uL — ABNORMAL HIGH (ref 4.0–10.5)

## 2012-02-13 LAB — BASIC METABOLIC PANEL
CO2: 23 mEq/L (ref 19–32)
Calcium: 10.6 mg/dL — ABNORMAL HIGH (ref 8.4–10.5)
Chloride: 99 mEq/L (ref 96–112)
Potassium: 3.8 mEq/L (ref 3.5–5.1)
Sodium: 133 mEq/L — ABNORMAL LOW (ref 135–145)

## 2012-02-13 LAB — URINE MICROSCOPIC-ADD ON

## 2012-02-13 LAB — URINALYSIS, ROUTINE W REFLEX MICROSCOPIC
Glucose, UA: NEGATIVE mg/dL
Leukocytes, UA: NEGATIVE
Protein, ur: 30 mg/dL — AB
pH: 5.5 (ref 5.0–8.0)

## 2012-02-13 LAB — DIFFERENTIAL
Basophils Absolute: 0 10*3/uL (ref 0.0–0.1)
Lymphocytes Relative: 9 % — ABNORMAL LOW (ref 12–46)
Neutro Abs: 14.4 10*3/uL — ABNORMAL HIGH (ref 1.7–7.7)
Neutrophils Relative %: 85 % — ABNORMAL HIGH (ref 43–77)

## 2012-02-13 MED ORDER — HYDROMORPHONE HCL PF 1 MG/ML IJ SOLN
1.0000 mg | Freq: Once | INTRAMUSCULAR | Status: AC
  Administered 2012-02-13: 1 mg via INTRAVENOUS
  Filled 2012-02-13: qty 1

## 2012-02-13 MED ORDER — IOHEXOL 300 MG/ML  SOLN
50.0000 mL | Freq: Once | INTRAMUSCULAR | Status: AC | PRN
  Administered 2012-02-13: 7 mL

## 2012-02-13 MED ORDER — OXYCODONE-ACETAMINOPHEN 5-325 MG PO TABS: ORAL_TABLET | ORAL | Status: DC

## 2012-02-13 MED ORDER — KETOROLAC TROMETHAMINE 30 MG/ML IJ SOLN
30.0000 mg | Freq: Once | INTRAMUSCULAR | Status: AC
  Administered 2012-02-13: 30 mg via INTRAVENOUS
  Filled 2012-02-13: qty 1

## 2012-02-13 MED ORDER — HYDROMORPHONE HCL PF 1 MG/ML IJ SOLN
INTRAMUSCULAR | Status: AC | PRN
  Administered 2012-02-13 (×2): 1 mg via INTRAVENOUS

## 2012-02-13 MED ORDER — OXYCODONE-ACETAMINOPHEN 5-325 MG PO TABS
2.0000 | ORAL_TABLET | Freq: Once | ORAL | Status: AC
  Administered 2012-02-13: 2 via ORAL
  Filled 2012-02-13: qty 2

## 2012-02-13 MED ORDER — CIPROFLOXACIN HCL 500 MG PO TABS: 500.0000 mg | ORAL_TABLET | Freq: Two times a day (BID) | ORAL | Status: AC

## 2012-02-13 MED ORDER — GENTAMICIN IN SALINE 1.6-0.9 MG/ML-% IV SOLN
80.0000 mg | Freq: Once | INTRAVENOUS | Status: AC
  Administered 2012-02-13: 80 mg via INTRAVENOUS
  Filled 2012-02-13: qty 50

## 2012-02-13 MED ORDER — PROMETHAZINE HCL 25 MG/ML IJ SOLN
25.0000 mg | Freq: Once | INTRAMUSCULAR | Status: AC
  Administered 2012-02-13: 25 mg via INTRAVENOUS
  Filled 2012-02-13: qty 1

## 2012-02-13 MED ORDER — MIDAZOLAM HCL 5 MG/5ML IJ SOLN
INTRAMUSCULAR | Status: AC | PRN
  Administered 2012-02-13: 2 mg via INTRAVENOUS
  Administered 2012-02-13: 1 mg via INTRAVENOUS

## 2012-02-13 NOTE — ED Notes (Signed)
Remains in X-Ray- interventional radiology

## 2012-02-13 NOTE — ED Notes (Signed)
Patient is resting comfortably. 

## 2012-02-13 NOTE — Consult Note (Signed)
Urology Consult  Referring physician: [facility] Reason for referral: RT colic  Chief Complaint: Rt ureteral stone   History of Present Illness:The patient presented to the emergency room with right-sided colic. She had lithotripsy on Thursday. She had ureteroscopy for a large stone recently. She was known to have a midureteral stone or possibly 1 cm with significant hydronephrosis. She's been passing gravel. Her hydrocodone was not holding her. The pain came on suddenly was not and trolled by the medication. She had no increase in frequency or fever. She is currently not taking the Levaquin  Modifying factors: There are no other modifying factors  Associated signs and symptoms: There are no other associated signs and symptoms Aggravating and relieving factors: There are no other aggravating or relieving factors Severity: Moderate Duration: Persistent   Past Medical History  Diagnosis Date  . Ischemic heart disease   . Diabetes mellitus   . Obesity   . Chronic anxiety   . Nephrolithiasis   . Lumbar herniated disc   . Depression   . Situational stress   . Hyperlipemia   . History of bronchitis   . History of pneumonia   . Hypothyroidism   . Coronary artery disease CARDIOLOGIST- DR Maylon Cos-- VISIT 04-01-2011 W/ CHART AND IN EPIC  . History of kidney stones S/P URETEROSCOPIC STONE EXTRACTIONS  . Right ureteral calculus   . 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  NO ISCHEMIA. SUSPECT THIS REPRESENTS PRIOR MI BUT PATTERN UNUSUAL GIVEN WORSE DEFECT AT REST. APICAL HYPODINESIS BUT OVERALL PRESERVED EF.  Marland Kitchen History of acute anterior wall MI MARCH 1984    S/P ANGIOPLASTY AND THROMBOLYTICS  . History of hypercalcemia   . History of echocardiogram 05-20-2007    NORMAL LVSF. DISTAL SEPTAL HYPOKINESIA. MILD LEVH W/ IMPAIRED LV RELAXATION. MILD AORTIC SCLEROSIS. MILD TRICUSPID REGURGITATION  .  Arthritis   . GERD (gastroesophageal reflux disease)   . PONV (postoperative nausea and vomiting)   . Panic attack as reaction to stress   . Female bladder prolapse   . Urge urinary incontinence   . Myocardial infarction     1984 MI with angioplasty   Past Surgical History  Procedure Date  . Left ureteroscopic laser litho/ stone extraction 12-16-2009  &  11-17-2007  . Right ureteroscopic stone extraction  07-04-2008  . Coronary angioplasty 1984  . Cardiac catheterization 04-18-2008; 05-08-2004;  11-29-1986; 08-19-1988    2009 REPORT---- MILD ANTERIOR HYPOKINESIS (FROM REMOTE MI), MILD CORONARY ATHEROSCLEROSIS W/ 30-40% NARROWING IN THE MID LAD & 50% NARROWING IN THE SMALL OBTUSE MARGINAL,  NORMAL RENAL ARTERIES W/ NO ABD. AORTIC DISEASE  . Carpal tunnel release 2005    RIGHT  . Extracorporeal shock wave lithotripsy 2007    X2  . Facial cosmetic surgery   . Ureteroscopy 01/25/2012    Procedure: URETEROSCOPY;  Surgeon: Garnett Farm, MD;  Location: [facility];  Service: Urology;  Laterality: Right;    Medications: see orders Allergies:  Allergies  Allergen Reactions  . Keflex Hives    Family History  Problem Relation Age of Onset  . Heart disease Mother   . Heart attack Father   . Heart disease Brother    Social History:  reports that she quit smoking about 29 years ago. Her smoking use included Cigarettes. She has never used smokeless tobacco. She reports that she drinks alcohol. Her drug history not on file.  ROS: All systems are reviewed and negative except as noted. Rest ros -  Physical Exam:  Vital signs in last 24 hours: Temp:  [97.4 F (36.3 C)-98.5 F (36.9 C)] 98.5 F (36.9 C) (02/16 0716) Pulse Rate:  [69-73] 71  (02/16 0716) Resp:  [16-22] 18  (02/16 0642) BP: (90-160)/(45-75) 105/47 mmHg (02/16 0716) SpO2:  [97 %-100 %] 100 % (02/16 0716)  Cardiovascular: Skin warm; not flushed Respiratory: Breaths quiet; no shortness of breath Abdomen:  No masses Neurological: Normal sensation to touch Musculoskeletal: Normal motor function arms and legs Lymphatics: No inguinal adenopathy Skin: No rashes Genitourinary:No CVA tenderness/non-toxic  Laboratory Data:  Results for orders placed during the hospital encounter of 02/12/12 (from the past 72 hour(s))  CBC     Status: Abnormal   Collection Time   02/12/12 11:55 PM      Component Value Range Comment   WBC 17.0 (*) 4.0 - 10.5 (K/uL)    RBC 4.04  3.87 - 5.11 (MIL/uL)    Hemoglobin 11.7 (*) 12.0 - 15.0 (g/dL)    HCT 16.1 (*) 09.6 - 46.0 (%)    MCV 86.4  78.0 - 100.0 (fL)    MCH 29.0  26.0 - 34.0 (pg)    MCHC 33.5  30.0 - 36.0 (g/dL)    RDW 04.5  40.9 - 81.1 (%)    Platelets 319  150 - 400 (K/uL)   DIFFERENTIAL     Status: Abnormal   Collection Time   02/12/12 11:55 PM      Component Value Range Comment   Neutrophils Relative 85 (*) 43 - 77 (%)    Neutro Abs 14.4 (*) 1.7 - 7.7 (K/uL)    Lymphocytes Relative 9 (*) 12 - 46 (%)    Lymphs Abs 1.5  0.7 - 4.0 (K/uL)    Monocytes Relative 6  3 - 12 (%)    Monocytes Absolute 0.9  0.1 - 1.0 (K/uL)    Eosinophils Relative 0  0 - 5 (%)    Eosinophils Absolute 0.0  0.0 - 0.7 (K/uL)    Basophils Relative 0  0 - 1 (%)    Basophils Absolute 0.0  0.0 - 0.1 (K/uL)   BASIC METABOLIC PANEL     Status: Abnormal   Collection Time   02/12/12 11:55 PM      Component Value Range Comment   Sodium 133 (*) 135 - 145 (mEq/L)    Potassium 3.8  3.5 - 5.1 (mEq/L)    Chloride 99  96 - 112 (mEq/L)    CO2 23  19 - 32 (mEq/L)    Glucose, Bld 155 (*) 70 - 99 (mg/dL)    BUN 24 (*) 6 - 23 (mg/dL)    Creatinine, Ser 9.14 (*) 0.50 - 1.10 (mg/dL)    Calcium 78.2 (*) 8.4 - 10.5 (mg/dL)    GFR calc non Af Amer 41 (*) >90 (mL/min)    GFR calc Af Amer 47 (*) >90 (mL/min)   URINALYSIS, ROUTINE W REFLEX MICROSCOPIC     Status: Abnormal   Collection Time   02/13/12 12:01 AM      Component Value Range Comment   Color, Urine YELLOW  YELLOW     APPearance CLOUDY  (*) CLEAR     Specific Gravity, Urine 1.031 (*) 1.005 - 1.030     pH 5.5  5.0 - 8.0     Glucose, UA NEGATIVE  NEGATIVE (mg/dL)    Hgb urine dipstick MODERATE (*) NEGATIVE  Bilirubin Urine NEGATIVE  NEGATIVE     Ketones, ur NEGATIVE  NEGATIVE (mg/dL)    Protein, ur 30 (*) NEGATIVE (mg/dL)    Urobilinogen, UA 0.2  0.0 - 1.0 (mg/dL)    Nitrite NEGATIVE  NEGATIVE     Leukocytes, UA NEGATIVE  NEGATIVE    URINE MICROSCOPIC-ADD ON     Status: Abnormal   Collection Time   02/13/12 12:01 AM      Component Value Range Comment   Squamous Epithelial / LPF RARE  RARE     RBC / HPF 3-6  <3 (RBC/hpf)    Bacteria, UA MANY (*) RARE     Crystals CA OXALATE CRYSTALS (*) NEGATIVE     Urine-Other MUCOUS PRESENT      No results found for this or any previous visit (from the past 240 hour(s)). Creatinine:  Basename 02/12/12 2355  CREATININE 1.32*    Xrays: See report/chart CT reviewed - severe hydro and midureteral 1 cm stone  Reviewed all labs: u/s and Cr, etc  Impression/Assessment:  Rt ureteral stone with severe hydro  Plan:  [name] [name] has significant right hydronephrosis and a large ureteral stone on the right side. I went over treatment options including watchful waiting; observation in the hospital; insertion of right ureteral stent and a right nephrostomy tube. She's had a lot of issues with stents in the past. She's and educated woman and both her and her husband chose to have a right nephrostomy tube and I think is a good option for her based upon her past experiences and current situation here although the nephrostomy tube will be inconvenient it were free her from pain and allow time for Dr. Vernie Ammons to schedule future treatments. I tried to call Dr. Vernie Ammons and checked his away schedule and felt this was a good treatment choice for her.  [name] [name] A 02/13/2012, 7:37 AM

## 2012-02-13 NOTE — ED Notes (Signed)
Permit signed by patient and husband for nephrostomy tube placement- explained by Elita Quick, PA with Radiology-- to radiology for procedure

## 2012-02-13 NOTE — ED Notes (Signed)
Report received from Tyler, RN

## 2012-02-13 NOTE — Procedures (Signed)
63F pigtail into L kidney under [place] and fluoro No complication No blood loss. See complete dictation in [facility].

## 2012-02-13 NOTE — H&P (Signed)
She has tolerated ureteral stents poorly in the past and has elected to proceed with primary nephrostomy placement today.

## 2012-02-13 NOTE — H&P (Signed)
[name] [name] is an 66 y.o. female.   Chief Complaint: renal stone with hydro and renal failure. HPI: Hx of many stones in past.  See urology note from admission earlier today -s/p lithotripsy last week.   Past Medical History  Diagnosis Date  . Ischemic heart disease   . Diabetes mellitus   . Obesity   . Chronic anxiety   . Nephrolithiasis   . Lumbar herniated disc   . Depression   . Situational stress   . Hyperlipemia   . History of bronchitis   . History of pneumonia   . Hypothyroidism   . Coronary artery disease CARDIOLOGIST- DR Maylon Cos-- VISIT 04-01-2011 W/ CHART AND IN EPIC  . History of kidney stones S/P URETEROSCOPIC STONE EXTRACTIONS  . Right ureteral calculus   . 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  NO ISCHEMIA. SUSPECT THIS REPRESENTS PRIOR MI BUT PATTERN UNUSUAL GIVEN WORSE DEFECT AT REST. APICAL HYPODINESIS BUT OVERALL PRESERVED EF.  Marland Kitchen History of acute anterior wall MI MARCH 1984    S/P ANGIOPLASTY AND THROMBOLYTICS  . History of hypercalcemia   . History of echocardiogram 05-20-2007    NORMAL LVSF. DISTAL SEPTAL HYPOKINESIA. MILD LEVH W/ IMPAIRED LV RELAXATION. MILD AORTIC SCLEROSIS. MILD TRICUSPID REGURGITATION  . Arthritis   . GERD (gastroesophageal reflux disease)   . PONV (postoperative nausea and vomiting)   . Panic attack as reaction to stress   . Female bladder prolapse   . Urge urinary incontinence   . Myocardial infarction     1984 MI with angioplasty    Past Surgical History  Procedure Date  . Left ureteroscopic laser litho/ stone extraction 12-16-2009  &  11-17-2007  . Right ureteroscopic stone extraction  07-04-2008  . Coronary angioplasty 1984  . Cardiac catheterization 04-18-2008; 05-08-2004;  11-29-1986; 08-19-1988    2009 REPORT---- MILD ANTERIOR HYPOKINESIS (FROM REMOTE MI), MILD CORONARY ATHEROSCLEROSIS W/ 30-40% NARROWING IN THE MID  LAD & 50% NARROWING IN THE SMALL OBTUSE MARGINAL,  NORMAL RENAL ARTERIES W/ NO ABD. AORTIC DISEASE  . Carpal tunnel release 2005    RIGHT  . Extracorporeal shock wave lithotripsy 2007    X2  . Facial cosmetic surgery   . Ureteroscopy 01/25/2012    Procedure: URETEROSCOPY;  Surgeon: Garnett Farm, MD;  Location: [facility];  Service: Urology;  Laterality: Right;    Family History  Problem Relation Age of Onset  . Heart disease Mother   . Heart attack Father   . Heart disease Brother    Social History:  reports that she quit smoking about 29 years ago. Her smoking use included Cigarettes. She has never used smokeless tobacco. She reports that she drinks alcohol. Her drug history not on file.  Allergies:  Allergies  Allergen Reactions  . Keflex Hives    Medications Prior to Admission  Medication Dose Route Frequency Provider Last Rate Last Dose  . ciprofloxacin (CIPRO) tablet 500 mg  500 mg Oral 60 min Pre-Op    500 mg at 02/11/12 0858  . gentamicin (GARAMYCIN) IVPB 80 mg  80 mg Intravenous Once Gavin Pound. Ghim, MD      . HYDROmorphone (DILAUDID) injection 1 mg  1 mg Intravenous Once Gwyneth Sprout, MD   1 mg at 02/13/12 0006  . HYDROmorphone (DILAUDID) injection 1 mg  1 mg Intravenous Once Gwyneth Sprout, MD   1 mg at 02/13/12  0037  . HYDROmorphone (DILAUDID) injection 1 mg  1 mg Intravenous Once Gwyneth Sprout, MD   1 mg at 02/13/12 0401  . HYDROmorphone (DILAUDID) injection 1 mg  1 mg Intravenous Once Tatyana A Kirichenko, PA   1 mg at 02/13/12 0739  . ketorolac (TORADOL) 30 MG/ML injection 30 mg  30 mg Intravenous Once Gwyneth Sprout, MD   30 mg at 02/13/12 0133  . ondansetron (ZOFRAN) injection 4 mg  4 mg Intravenous Once Gwyneth Sprout, MD   4 mg at 02/13/12 0006  . promethazine (PHENERGAN) injection 25 mg  25 mg Intravenous Once Gwyneth Sprout, MD   25 mg at 02/13/12 0033  . DISCONTD: 0.45 % sodium chloride infusion   Intravenous Continuous       .  DISCONTD: ciprofloxacin (CIPRO) 500 MG tablet           . DISCONTD: diazepam (VALIUM) tablet 10 mg  10 mg Oral On Call       . DISCONTD: diphenhydrAMINE (BENADRYL) capsule 25 mg  25 mg Oral On Call       . DISCONTD: lactated ringers infusion   Intravenous Continuous Azell Der, MD       Medications Prior to Admission  Medication Sig Dispense Refill  . acetaminophen (TYLENOL) 500 MG tablet Take 1,000 mg by mouth every 6 (six) hours as needed. Pain       . ALPRAZolam (XANAX) 0.5 MG tablet Take 1 mg by mouth. ONE TABLET IN AM, TWO TABLETS IN PM AND PRN X1 DURING THE DAY      . aspirin 81 MG tablet Take 81 mg by mouth every morning.       Marland Kitchen atorvastatin (LIPITOR) 40 MG tablet Take 40 mg by mouth every evening.       . diltiazem (CARDIZEM CD) 120 MG 24 hr capsule Take 120 mg by mouth every evening.       . ezetimibe (ZETIA) 10 MG tablet Take 10 mg by mouth at bedtime.       Marland Kitchen FLUoxetine (PROZAC) 20 MG capsule Take 20 mg by mouth daily with breakfast.       . HYDROcodone-acetaminophen (NORCO) 5-325 MG per tablet Take 1 tablet by mouth every 4 (four) hours as needed.      Marland Kitchen ibuprofen (ADVIL,MOTRIN) 200 MG tablet Take 400 mg by mouth every 6 (six) hours as needed. Pain       . isometheptene-acetaminophen-dichloralphenazone (MIDRIN) 65-325-100 MG capsule Take 1 capsule by mouth 4 (four) times daily as needed. Headache       . levothyroxine (SYNTHROID, LEVOTHROID) 150 MCG tablet Take 150 mcg by mouth every morning.       Marland Kitchen losartan (COZAAR) 25 MG tablet Take 25 mg by mouth at bedtime.       . metFORMIN (GLUCOPHAGE) 1000 MG tablet Take 1,000 mg by mouth 2 (two) times daily.       . metoprolol succinate (TOPROL-XL) 25 MG 24 hr tablet Take 25 mg by mouth every morning.       Marland Kitchen omeprazole (PRILOSEC) 20 MG capsule Take 20 mg by mouth daily as needed. Heart burn       . Polyethyl Glycol-Propyl Glycol (SYSTANE) 0.4-0.3 % SOLN Place 1 drop into both eyes 2 (two) times daily as needed. Dry eyes      .  Tamsulosin HCl (FLOMAX) 0.4 MG CAPS Take 1 capsule (0.4 mg total) by mouth daily after supper.  30 capsule  11  . Vitamin D, Ergocalciferol, (  DRISDOL) 50000 UNITS CAPS Take 50,000 Units by mouth every 7 (seven) days. Friday      . furosemide (LASIX) 40 MG tablet Take 20 mg by mouth daily as needed. Fluid       . nitroGLYCERIN (NITROSTAT) 0.4 MG SL tablet Place 0.4 mg under the tongue every 5 (five) minutes as needed. Chest pain         Results for orders placed during the hospital encounter of 02/12/12 (from the past 48 hour(s))  CBC     Status: Abnormal   Collection Time   02/12/12 11:55 PM      Component Value Range Comment   WBC 17.0 (*) 4.0 - 10.5 (K/uL)    RBC 4.04  3.87 - 5.11 (MIL/uL)    Hemoglobin 11.7 (*) 12.0 - 15.0 (g/dL)    HCT 16.1 (*) 09.6 - 46.0 (%)    MCV 86.4  78.0 - 100.0 (fL)    MCH 29.0  26.0 - 34.0 (pg)    MCHC 33.5  30.0 - 36.0 (g/dL)    RDW 04.5  40.9 - 81.1 (%)    Platelets 319  150 - 400 (K/uL)   DIFFERENTIAL     Status: Abnormal   Collection Time   02/12/12 11:55 PM      Component Value Range Comment   Neutrophils Relative 85 (*) 43 - 77 (%)    Neutro Abs 14.4 (*) 1.7 - 7.7 (K/uL)    Lymphocytes Relative 9 (*) 12 - 46 (%)    Lymphs Abs 1.5  0.7 - 4.0 (K/uL)    Monocytes Relative 6  3 - 12 (%)    Monocytes Absolute 0.9  0.1 - 1.0 (K/uL)    Eosinophils Relative 0  0 - 5 (%)    Eosinophils Absolute 0.0  0.0 - 0.7 (K/uL)    Basophils Relative 0  0 - 1 (%)    Basophils Absolute 0.0  0.0 - 0.1 (K/uL)   BASIC METABOLIC PANEL     Status: Abnormal   Collection Time   02/12/12 11:55 PM      Component Value Range Comment   Sodium 133 (*) 135 - 145 (mEq/L)    Potassium 3.8  3.5 - 5.1 (mEq/L)    Chloride 99  96 - 112 (mEq/L)    CO2 23  19 - 32 (mEq/L)    Glucose, Bld 155 (*) 70 - 99 (mg/dL)    BUN 24 (*) 6 - 23 (mg/dL)    Creatinine, Ser 9.14 (*) 0.50 - 1.10 (mg/dL)    Calcium 78.2 (*) 8.4 - 10.5 (mg/dL)    GFR calc non Af Amer 41 (*) >90 (mL/min)    GFR  calc Af Amer 47 (*) >90 (mL/min)   URINALYSIS, ROUTINE W REFLEX MICROSCOPIC     Status: Abnormal   Collection Time   02/13/12 12:01 AM      Component Value Range Comment   Color, Urine YELLOW  YELLOW     APPearance CLOUDY (*) CLEAR     Specific Gravity, Urine 1.031 (*) 1.005 - 1.030     pH 5.5  5.0 - 8.0     Glucose, UA NEGATIVE  NEGATIVE (mg/dL)    Hgb urine dipstick MODERATE (*) NEGATIVE     Bilirubin Urine NEGATIVE  NEGATIVE     Ketones, ur NEGATIVE  NEGATIVE (mg/dL)    Protein, ur 30 (*) NEGATIVE (mg/dL)    Urobilinogen, UA 0.2  0.0 - 1.0 (mg/dL)    Nitrite NEGATIVE  NEGATIVE     Leukocytes, UA NEGATIVE  NEGATIVE    URINE MICROSCOPIC-ADD ON     Status: Abnormal   Collection Time   02/13/12 12:01 AM      Component Value Range Comment   Squamous Epithelial / LPF RARE  RARE     RBC / HPF 3-6  <3 (RBC/hpf)    Bacteria, UA MANY (*) RARE     Crystals CA OXALATE CRYSTALS (*) NEGATIVE     Urine-Other MUCOUS PRESENT      Ct Abdomen Pelvis Wo Contrast  02/13/2012  *RADIOLOGY REPORT*  Clinical Data: History kidney stones and recent lithotripsy.  CT ABDOMEN AND PELVIS WITHOUT CONTRAST  Technique:  Multidetector CT imaging of the abdomen and pelvis was performed following the standard protocol without intravenous contrast.  Comparison: Abdominal radiograph 02/13/2012 and CT from 05/24/2010  Findings: The lung bases are clear.  There is no evidence for free air.  There is severe right hydroureteronephrosis.  The hydronephrosis is secondary to stones in the mid/distal right ureter.  This may represent multiple stones with the largest stone measuring up to 1 cm.  The distal ureter is filled with stones.  There are stones at the right ureterovesical junction and two stones in the bladder. There are 2 or 3 small stones in the right kidney lower pole. There is right perinephric stranding related to the hydroureteronephrosis.  There are at least three stones in the left kidney.  The largest measures 3 mm  in the upper pole.  No evidence for left hydronephrosis.  No gross abnormality of the liver, gallbladder, spleen, pancreas or adrenal glands.  Again seen are prominent lymph nodes in the periaortic region that are similar to the exam in 2011.  No gross abnormality involving the uterus or adnexa tissue.  Colonic diverticulosis involving the left colon without acute colonic inflammation.  Normal appearance of the appendix. No acute bony abnormality.  IMPRESSION: Severe right hydroureteronephrosis.  There is an obstructing stone(s) measuring roughly 1 cm in the mid/distal right ureter.  The distal right ureter and right ureterovesical junction are filled with stones.  Bilateral kidney stones. Urinary bladder stones.  Original Report Authenticated By: Richarda Overlie, M.D.   Dg Abd 1 View  02/13/2012  *RADIOLOGY REPORT*  Clinical Data: Nausea and dry heaves; right-sided abdominal pain. Status post lithotripsy 1 day ago.  ABDOMEN - 1 VIEW  Comparison: Abdominal radiograph performed 02/11/2012  Findings: The previously noted large stone along the course of the right ureter is not definitely characterized, but cannot be excluded given apparent focal density at the same location.  This is not well assessed due to the increased amount of stool throughout the colon.  Left-sided nephrolithiasis again noted; right-sided nephrolithiasis is less well characterized.  No acute osseous abnormalities are identified.  No free intra-abdominal air is identified, though evaluation for free air is suboptimal on supine views.  IMPRESSION:  1.  Previously noted large stone along the course of the right ureter cannot be excluded given apparent focal density at the same location, but is not definitely delineated.  This may simply reflect overlying stool. 2.  Increased stool burden likely reflects mild constipation. 3.  Left-sided nephrolithiasis noted.  Original Report Authenticated By: Tonia Ghent, M.D.    Review of Systems   Constitutional: Negative for fever.  Respiratory: Negative for hemoptysis and shortness of breath.   Cardiovascular: Positive for leg swelling. Negative for chest pain and palpitations.  Genitourinary: Positive for dysuria, hematuria and flank pain.  Psychiatric/Behavioral: The patient is nervous/anxious.     Blood pressure 105/47, pulse 71, temperature 98.5 F (36.9 C), temperature source Oral, resp. rate 18, SpO2 100.00%. Physical Exam  Constitutional: She is oriented to person, place, and time. She appears well-developed and well-nourished.  HENT:  Head: Normocephalic and atraumatic.  Cardiovascular: Normal rate, regular rhythm and normal heart sounds.  Exam reveals no gallop and no friction rub.   No murmur heard. Respiratory: Effort normal and breath sounds normal. No respiratory distress. She has no wheezes.  GI: Soft. Bowel sounds are normal.  Musculoskeletal: Normal range of motion.  Neurological: She is alert and oriented to person, place, and time.  Skin: Skin is warm and dry.     Assessment/Plan Patient with right renal stone causing hydronephrosis and ARF. Patient has been talked with in regards to PCN placement to assist with resolution of hydronephrosis and renal function. We will send urine for cultures once placed.  Procedure details for placement discussed with patient and spouse.  Potential complications including but not limited to infection, bleeding organ damage, and complications with moderate sedation discussed with her apparent understanding.  Permit signed by patient and spouse given recent pain medications.   Aws Shere D 02/13/2012, 9:29 AM

## 2012-02-13 NOTE — ED Provider Notes (Addendum)
  Physical Exam  BP 105/47  Pulse 71  Temp(Src) 98.5 F (36.9 C) (Oral)  Resp 18  SpO2 100%  Physical Exam  ED Course  Procedures  MDM 8:15 AM  Dr. Sherron Monday has seen pt, has discussed options with pt, will plan on a nephrostomy tube by interventional radiology.  He is waiting to hear from Dr. Lowella Dandy.  I have made pt NPO and ordered a dose of gentamicin per him.      1:32 PM Nephrostomy has been placed successfully.  Dr. Sherron Monday has given ok to be discharged.  He would like for her to have 40 tablets of percocet and 5 days worth of cipro.  Gavin Pound. Oletta Lamas, MD 02/13/12 1333  Gavin Pound. Geraline Halberstadt, MD 02/13/12 1336

## 2012-02-13 NOTE — Discharge Instructions (Signed)
Hydronephrosis Hydronephrosis is an abnormal enlargement of your kidney. It can affect one or both the kidneys. It results from the backward pressure of urine on the kidneys, when the flow of urine is blocked. Normally, the urine drains from the kidney through the urine tube (ureter), into a sac which holds the urine until urination (bladder). When the urinary flow is blocked, the urine collects above the block. This causes an increase in the pressure inside the kidney, which in turn leads to its enlargement. The block can occur at the point where the kidney joins the ureter. Treatment depends on the cause and location of the block.  CAUSES  The causes of this condition include:  Birth defect of the kidney or ureter.   Kink at the point where the kidney joins the ureter.   Stones and blood clots in the kidney or ureter.   Cancer, injury, or infection of the ureter.   Scar tissue formation.   Backflow of urine (reflux).   Cancer of bladder or prostate gland.   Abnormality of the nerves or muscles of the kidney or ureter.   Lower part of the ureter protruding into the bladder (ureterocele).   Abnormal contractions of the bladder.   Both the kidneys can be affected during pregnancy. This is because the enlarging uterus presses on the ureters and blocks the flow of urine.  SYMPTOMS  The symptoms depend on the location of the block. They also depend on how long the block has been present. You may feel pain on the affected side. Sometimes, you may not have any symptoms. There may be a dull ache or discomfort in the flank. The common symptoms are:  Flank pain.   Swelling of the abdomen.   Pain in the abdomen.   Nausea and vomiting.   Fever.   Pain while passing urine.   Urgency for urination.   Frequent or urgent urination.   Infection of the urinary tract.  DIAGNOSIS  Your caregiver will examine you after asking about your symptoms. You may be asked to do blood and urine  tests. Your caregiver may order a special X-ray, ultrasound, or CT scan. Sometimes a rigid or flexible telescope (cystoscope) is used to view the site of the blockage.  TREATMENT  Treatment depends on the site, cause, and duration of the block. The goal of treatment is to remove the blockage. Your caregiver will plan the treatment based on your condition. The different types of treatment are:   Putting in a soft plastic tube (ureteral stent) to connect the bladder with the kidney. This will help in draining the urine.   Putting in a soft tube (nephrostomy tube). This is placed through skin into the kidney. The trapped urine is drained out through the back. A plastic bag is attached to your skin to hold the urine that has drained out.   Antibiotics to treat or prevent infection.   Breaking down of the stone (lithotripsy).  HOME CARE INSTRUCTIONS   It may take some time for the hydronephrosis to go away (resolve). Drink fluids as directed by your caregiver , and get a lot of rest.   If you have a drain in, your caregiver will give you directions about how to care for it. Be sure you understand these directions completely before you go home.   Take any antibiotics, pain medications, or other prescriptions exactly as prescribed.   Follow-up with your caregivers as directed.  SEEK MEDICAL CARE IF:   You continue  to have flank pain, nausea, or difficulty with urination.   You have any problem with any type of drainage device.   Your urine becomes cloudy or bloody.  SEEK IMMEDIATE MEDICAL CARE IF:   You have severe flank and/or abdominal pain.   You develop vomiting and are unable to hold down fluids.   You develop a fever above 100.5 F (38.1 C), or as per your caregiver.  MAKE SURE YOU:   Understand these instructions.   Will watch your condition.   Will get help right away if you are not doing well or get worse.  Document Released: 10/11/2007 Document Revised: 08/26/2011  Document Reviewed: 11/27/2010 [facility] Patient Information 2012 [place], [place].

## 2012-04-28 ENCOUNTER — Other Ambulatory Visit: Payer: Self-pay | Admitting: Obstetrics and Gynecology

## 2012-04-28 DIAGNOSIS — Z1231 Encounter for screening mammogram for malignant neoplasm of breast: Secondary | ICD-10-CM

## 2012-05-02 ENCOUNTER — Ambulatory Visit
Admission: RE | Admit: 2012-05-02 | Discharge: 2012-05-02 | Disposition: A | Discharge status: Home / Self Care | Payer: Medicare Other | Source: Ambulatory Visit | Attending: Obstetrics and Gynecology | Admitting: Obstetrics and Gynecology

## 2012-05-02 DIAGNOSIS — Z1231 Encounter for screening mammogram for malignant neoplasm of breast: Secondary | ICD-10-CM

## 2012-05-10 ENCOUNTER — Other Ambulatory Visit: Payer: Self-pay | Admitting: Cardiology

## 2012-05-25 ENCOUNTER — Encounter: Payer: Self-pay | Admitting: Physician Assistant

## 2012-05-25 ENCOUNTER — Ambulatory Visit (INDEPENDENT_AMBULATORY_CARE_PROVIDER_SITE_OTHER): Payer: Medicare Other | Admitting: Physician Assistant

## 2012-05-25 DIAGNOSIS — I259 Chronic ischemic heart disease, unspecified: Secondary | ICD-10-CM

## 2012-05-25 DIAGNOSIS — R079 Chest pain, unspecified: Secondary | ICD-10-CM

## 2012-05-25 DIAGNOSIS — I2510 Atherosclerotic heart disease of native coronary artery without angina pectoris: Secondary | ICD-10-CM

## 2012-05-25 DIAGNOSIS — Z01810 Encounter for preprocedural cardiovascular examination: Secondary | ICD-10-CM

## 2012-05-25 NOTE — Patient Instructions (Signed)
Your physician has requested that you have a lexiscan myoview DX V72.81 PT HAVING CATARACT SURGERY 05/31/12, OK PER WANDA TO PUT PT ON 05/26/12 @ 9 AM. For further information please visit [email or web address] Please follow instruction sheet, as given.

## 2012-05-25 NOTE — Progress Notes (Signed)
[address] [place], [place]  [address] Phone: [phone] Fax:  [phone]  Date:  05/25/2012   Name:  [name] [name]   DOB:  [birth year]   MRN:  [id number]  PCP:  Kari Baars, MD, MD  Primary Cardiologist:  Dr. Olga Millers  Primary Electrophysiologist:  None    History of Present Illness: [name] [name] is a 66 y.o. female who returns for evaluation of chest pain and surgical clearance.  She is a prior patient of Dr. Deborah Chalk.  She now sees Dr. Jens Som.  She has a history of CAD, status post anterior MI in 1984 treated with thrombolytics and POBA.  Last echo in 5/08: Normal LV function, distal septal HK, impaired LV relaxation, mild TR.  LHC 4/09: Normal left main, mid LAD 30-40%, small OM 50%, RCA normal, EF 50%.  There was mild anterior wall HK.  Abdominal aortogram with normal renal arteries.  Myoview 4/12: EF 63%, no ischemia but possible prior anterior infarct.  She establish with Dr. Jens Som 01/22/12.  Plan was for one year follow up.  She needs cataract surgery with Dr. Elmer Picker.  During her preop visit, she noted recent use of nitroglycerin.  She tells me that she worked out in her yard about 3-4 weeks ago.  It sounds like she did fairly heavy exertion.  During exertion, she had no chest discomfort or unusual shortness of breath.  Afterward she developed back, chest and shoulder discomfort.  She has not taken nitroglycerin in over 3 years.  She decided to take nitroglycerin to see if this would help.  She had no change in her symptoms.  Same symptoms occurred the next day.  She denies orthopnea, PND, edema.  Exercise tolerance has gradually declined over the years.    Wt Readings from Last 3 Encounters:  05/25/12 187 lb (84.823 kg)  02/11/12 181 lb 6 oz (82.271 kg)  02/11/12 181 lb 6 oz (82.271 kg)     Potassium  Date/Time Value Range Status  02/12/2012 11:55 PM 3.8  3.5-5.1 (mEq/L) Final     Creatinine, Ser  Date/Time Value Range Status    02/12/2012 11:55 PM 1.32* 0.50-1.10 (mg/dL) Final     Hemoglobin  Date/Time Value Range Status  02/12/2012 11:55 PM 11.7* 12.0-15.0 (g/dL) Final    Past Medical History  Diagnosis Date  . Ischemic heart disease   . Diabetes mellitus   . Obesity   . Chronic anxiety   . Nephrolithiasis   . Lumbar herniated disc   . Depression   . Situational stress   . Hyperlipemia   . History of bronchitis   . History of pneumonia   . Hypothyroidism   . Coronary artery disease CARDIOLOGIST- DR Maylon Cos-- VISIT 04-01-2011 W/ CHART AND IN EPIC  . History of kidney stones S/P URETEROSCOPIC STONE EXTRACTIONS  . Right ureteral calculus   . 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  NO ISCHEMIA. SUSPECT THIS REPRESENTS PRIOR MI BUT PATTERN UNUSUAL GIVEN WORSE DEFECT AT REST. APICAL HYPODINESIS BUT OVERALL PRESERVED EF.  Marland Kitchen History of acute anterior wall MI MARCH 1984    S/P ANGIOPLASTY AND THROMBOLYTICS  . History of hypercalcemia   . History of echocardiogram 05-20-2007    NORMAL LVSF. DISTAL SEPTAL HYPOKINESIA. MILD LEVH W/ IMPAIRED LV RELAXATION. MILD AORTIC SCLEROSIS. MILD TRICUSPID REGURGITATION  . Arthritis   . GERD (gastroesophageal reflux disease)   .  PONV (postoperative nausea and vomiting)   . Panic attack as reaction to stress   . Female bladder prolapse   . Urge urinary incontinence   . Myocardial infarction     1984 MI with angioplasty    Current Outpatient Prescriptions  Medication Sig Dispense Refill  . acetaminophen (TYLENOL) 500 MG tablet Take 1,000 mg by mouth every 6 (six) hours as needed. Pain       . ALPRAZolam (XANAX) 0.5 MG tablet Take 1 mg by mouth. ONE TABLET IN AM, TWO TABLETS IN PM AND PRN X1 DURING THE DAY      . aspirin 81 MG tablet Take 81 mg by mouth every morning.       Marland Kitchen atorvastatin (LIPITOR) 40 MG tablet TAKE 1 TABLET DAILY  30 tablet  11  . diltiazem  (CARDIZEM CD) 120 MG 24 hr capsule TAKE ONE CAPSULE EVERY DAY  30 capsule  11  . FLUoxetine (PROZAC) 20 MG capsule Take 20 mg by mouth daily with breakfast.       . furosemide (LASIX) 40 MG tablet Take 20 mg by mouth daily as needed. Fluid       . ibuprofen (ADVIL,MOTRIN) 200 MG tablet Take 200 mg by mouth every 6 (six) hours as needed. Pain       . isometheptene-acetaminophen-dichloralphenazone (MIDRIN) 65-325-100 MG capsule Take 1 capsule by mouth 4 (four) times daily as needed. Headache       . levothyroxine (SYNTHROID, LEVOTHROID) 150 MCG tablet Take 150 mcg by mouth every morning.       Marland Kitchen losartan (COZAAR) 25 MG tablet TAKE 1 TABLET BY MOUTH EVERY DAY  30 tablet  11  . metFORMIN (GLUCOPHAGE) 1000 MG tablet Take 1,000 mg by mouth 2 (two) times daily.       . metoprolol succinate (TOPROL-XL) 25 MG 24 hr tablet TAKE 1 TABLET DAILY  30 tablet  11  . nitroGLYCERIN (NITROSTAT) 0.4 MG SL tablet Place 0.4 mg under the tongue every 5 (five) minutes as needed. Chest pain       . omeprazole (PRILOSEC) 20 MG capsule Take 20 mg by mouth daily as needed. Heart burn       . Polyethyl Glycol-Propyl Glycol (SYSTANE) 0.4-0.3 % SOLN Place 1 drop into both eyes 2 (two) times daily as needed. Dry eyes      . Tamsulosin HCl (FLOMAX) 0.4 MG CAPS Take 1 capsule (0.4 mg total) by mouth daily after supper.  30 capsule  11  . Vitamin D, Ergocalciferol, (DRISDOL) 50000 UNITS CAPS Take 50,000 Units by mouth every 7 (seven) days. Friday      . ZETIA 10 MG tablet TAKE 1 TABLET DAILY  30 tablet  10    Allergies: Allergies  Allergen Reactions  . Cephalexin Hives    History  Substance Use Topics  . Smoking status: Former Smoker    Types: Cigarettes    Quit date: 12/28/1982  . Smokeless tobacco: Never Used  . Alcohol Use: Yes     socially     ROS:  Please see the history of present illness.   She has some nausea, diarrhea.  All other systems reviewed and negative.   PHYSICAL EXAM: VS:  BP 112/64  Pulse  72  Ht 5\' 9"  (1.753 m)  Wt 187 lb (84.823 kg)  BMI 27.62 kg/m2 Well nourished, well developed, in no acute distress HEENT: normal Neck: no JVD Endocrine: no thyromegaly Cardiac:  normal S1, S2; RRR; no murmur Lungs:  clear  to auscultation bilaterally, no wheezing, rhonchi or rales Abd: soft, nontender, no hepatomegaly Ext: no edema Skin: warm and dry Neuro:  CNs 2-12 intact, no focal abnormalities noted  EKG:  Sinus rhythm, heart rate 72, nonspecific ST-T wave changes, no change from prior tracing   ASSESSMENT AND PLAN:  1.  Chest Pain Likely MSK. However, she took NTG and had not taken this in years. She is somewhat of a difficult historian. I will arrange for her to have a Lexiscan Myoview (could not achieve HR last time) tomorrow. If low risk, no further workup.  2.  Cataracts If Myoview low risk, she may proceed with planned surgery next week.  3.  Coronary Artery Disease Plan Myoview tomorrow. Continue ASA, statin. Follow up with Dr. Olga Millers as planned or sooner if myoview abnormal.  4.  Hypertension  Controlled.  Continue current therapy.    Signed, Tereso Newcomer, PA-C  2:53 PM 05/25/2012

## 2012-05-26 ENCOUNTER — Ambulatory Visit (HOSPITAL_COMMUNITY): Payer: Medicare Other | Attending: Cardiovascular Disease | Admitting: Radiology

## 2012-05-26 VITALS — BP 108/58 | HR 54 | Ht 69.0 in | Wt 187.0 lb

## 2012-05-26 DIAGNOSIS — R0789 Other chest pain: Secondary | ICD-10-CM | POA: Insufficient documentation

## 2012-05-26 DIAGNOSIS — I252 Old myocardial infarction: Secondary | ICD-10-CM | POA: Insufficient documentation

## 2012-05-26 DIAGNOSIS — E119 Type 2 diabetes mellitus without complications: Secondary | ICD-10-CM | POA: Insufficient documentation

## 2012-05-26 DIAGNOSIS — E785 Hyperlipidemia, unspecified: Secondary | ICD-10-CM | POA: Insufficient documentation

## 2012-05-26 DIAGNOSIS — Z9861 Coronary angioplasty status: Secondary | ICD-10-CM | POA: Insufficient documentation

## 2012-05-26 DIAGNOSIS — I259 Chronic ischemic heart disease, unspecified: Secondary | ICD-10-CM

## 2012-05-26 DIAGNOSIS — R079 Chest pain, unspecified: Secondary | ICD-10-CM

## 2012-05-26 DIAGNOSIS — Z87891 Personal history of nicotine dependence: Secondary | ICD-10-CM | POA: Insufficient documentation

## 2012-05-26 DIAGNOSIS — Z01810 Encounter for preprocedural cardiovascular examination: Secondary | ICD-10-CM | POA: Insufficient documentation

## 2012-05-26 DIAGNOSIS — R5381 Other malaise: Secondary | ICD-10-CM | POA: Insufficient documentation

## 2012-05-26 DIAGNOSIS — I2510 Atherosclerotic heart disease of native coronary artery without angina pectoris: Secondary | ICD-10-CM

## 2012-05-26 DIAGNOSIS — Z8249 Family history of ischemic heart disease and other diseases of the circulatory system: Secondary | ICD-10-CM | POA: Insufficient documentation

## 2012-05-26 MED ORDER — TECHNETIUM TC 99M TETROFOSMIN IV KIT
32.6000 | PACK | Freq: Once | INTRAVENOUS | Status: AC | PRN
  Administered 2012-05-26: 33 via INTRAVENOUS

## 2012-05-26 MED ORDER — REGADENOSON 0.4 MG/5ML IV SOLN
0.4000 mg | Freq: Once | INTRAVENOUS | Status: AC
  Administered 2012-05-26: 0.4 mg via INTRAVENOUS

## 2012-05-26 MED ORDER — TECHNETIUM TC 99M TETROFOSMIN IV KIT
11.0000 | PACK | Freq: Once | INTRAVENOUS | Status: AC | PRN
  Administered 2012-05-26: 11 via INTRAVENOUS

## 2012-05-26 NOTE — Progress Notes (Signed)
[facility] SITE 3 NUCLEAR MED [address] [place] [address] [phone]  Cardiology Nuclear Med Study  [name] [name] is a 66 y.o. female     MRN : [id number]     DOB: Nov 28, 1946  Procedure Date: 05/26/2012  Nuclear Med Background Indication for Stress Test:  Evaluation for Ischemia, PTCA Patency and Pending Surgical Clearance for Cataract Removal on 06/02/12 by Dr. Mateo Flow, MD History:  '84 AWMI>PTCA; '08 Echo:Normal LVF; '09 Cath:N/O CAD, EF=50%; 4/12 MPS:No ischemia, possible prior anterior infarct, EF=63% Cardiac Risk Factors: Family History - CAD, History of Smoking, Lipids and NIDDM  Symptoms:  Chest Tightness with Exertion (last episode of chest discomfort was about 2-weeks ago) and Fatigue   Nuclear Pre-Procedure Caffeine/Decaff Intake:  None > 12 hrs NPO After: 6:00pm   Lungs:  clear O2 Sat: 98% on room air. IV 0.9% NS with Angio Cath:  20g  IV Site: R Antecubital x 1, tolerated well IV Started by:  Irean Hong, RN  Chest Size (in):  42 Cup Size: C  Height: 5\' 9"  (1.753 m)  Weight:  187 lb (84.823 kg)  BMI:  Body mass index is 27.62 kg/(m^2). Tech Comments:  Toprol not held as directed.    Nuclear Med Study 1 or 2 day study: 1 day  Stress Test Type:  Treadmill/Lexiscan  Reading MD: Charlton Haws, MD  Order Authorizing Provider:  Olga Millers, MD, and Tereso Newcomer, PA-C  Resting Radionuclide: Technetium 69m Tetrofosmin  Resting Radionuclide Dose: 11.0 mCi   Stress Radionuclide:  Technetium 25m Tetrofosmin  Stress Radionuclide Dose: 32.6 mCi           Stress Protocol Rest HR: 54 Stress HR: 102  Rest BP: 108/58 Stress BP: 155/61  Exercise Time (min): 2:00 METS: n/a   Predicted Max HR: 153 bpm % Max HR: 66.67 bpm Rate Pressure Product: 32440   Dose of Adenosine (mg):  n/a Dose of Lexiscan: 0.4 mg  Dose of Atropine (mg): n/a Dose of Dobutamine: n/a mcg/kg/min (at max HR)  Stress Test Technologist: Smiley Houseman, CMA-N  Nuclear  Technologist:  Domenic Polite, CNMT     Rest Procedure:  Myocardial perfusion imaging was performed at rest 45 minutes following the intravenous administration of Technetium 11m Tetrofosmin.  Rest ECG: Early repolarization.  Stress Procedure:  The patient received IV Lexiscan 0.4 mg over 15-seconds with concurrent low level exercise and then Technetium 73m Tetrofosmin was injected at 30-seconds while the patient continued walking one more minute. There were no significant changes with Lexiscan. Quantitative spect images were obtained after a 45-minute delay.  Stress ECG: No significant change from baseline ECG  QPS Raw Data Images:  Patient motion noted. Stress Images:  There is decreased uptake in the anterior wall. Rest Images:  There is decreased uptake in the anterior wall. Subtraction (SDS):  There is a fixed defect that is most consistent with a previous infarction. Transient Ischemic Dilatation (Normal <1.22):  1.21 Lung/Heart Ratio (Normal <0.45):  0.28  Quantitative Gated Spect Images QGS EDV:  78 ml QGS ESV:  30 ml  Impression Exercise Capacity:  Lexiscan with no exercise. BP Response:  Normal blood pressure response. Clinical Symptoms:  No significant symptoms noted. ECG Impression:  No significant ST segment change suggestive of ischemia. Comparison with Prior Nuclear Study: No images to compare  Overall Impression:  Low risk stress nuclear study. Small anteroapical wall infarction with no ischemia  LV Ejection Fraction: 61%.  LV Wall Motion:  Surface  images show apical hypokinesis  Suggest echo to evaluate EF as it is likely lower than indicated  [employer]

## 2012-05-27 ENCOUNTER — Telehealth: Payer: Self-pay | Admitting: *Deleted

## 2012-05-27 NOTE — Telephone Encounter (Signed)
Message copied by Tarri Fuller on Fri May 27, 2012  3:56 PM ------      Message from: [place], [place] T      Created: Fri May 27, 2012  1:39 PM       Please inform patient stress test normal.      She may proceed with surgery at acceptable risk.      Please fax my note and stress test to surgeon.      Tereso Newcomer, PA-C  1:38 PM 05/27/2012

## 2012-05-27 NOTE — Telephone Encounter (Signed)
pt notified of stress test results and ok to have surgery with acceptable risk per Bing Neighbors. PAC, will fax ov and stress test results to Dr. Elmer Picker today

## 2012-05-27 NOTE — Progress Notes (Signed)
Please inform patient stress test normal. She may proceed with surgery at acceptable risk. Please fax my note and stress test to surgeon. Tereso Newcomer, PA-C  1:38 PM 05/27/2012

## 2012-06-11 ENCOUNTER — Other Ambulatory Visit: Payer: Self-pay | Admitting: Cardiology

## 2013-01-12 ENCOUNTER — Encounter: Payer: Self-pay | Admitting: Cardiology

## 2013-01-13 ENCOUNTER — Encounter: Payer: Self-pay | Admitting: Cardiology

## 2013-01-18 ENCOUNTER — Ambulatory Visit: Payer: Medicare Other | Admitting: Cardiology

## 2013-01-18 ENCOUNTER — Encounter: Payer: Self-pay | Admitting: Nurse Practitioner

## 2013-01-18 ENCOUNTER — Ambulatory Visit (INDEPENDENT_AMBULATORY_CARE_PROVIDER_SITE_OTHER): Payer: Medicare Other | Admitting: Nurse Practitioner

## 2013-01-18 VITALS — BP 124/78 | HR 78 | Ht 68.0 in | Wt 208.0 lb

## 2013-01-18 DIAGNOSIS — I259 Chronic ischemic heart disease, unspecified: Secondary | ICD-10-CM

## 2013-01-18 NOTE — Patient Instructions (Addendum)
Try to be as active as possible and work on your weight  I will look at your labs from Dr. Clelia Croft and call you.  See Dr. Jens Som in 1 year  Call the [facility] office at [phone] if you have any questions, problems or concerns.

## 2013-01-18 NOTE — Progress Notes (Addendum)
[name] [name] Date of Birth: Dec 24, 1946 Medical Record #[id number]  History of Present Illness: [name] [name] is seen today for her 6 month check. She is seen for Dr. Jens Som. She is a former patient of Dr. Ronnald Nian that I know quite well. She has multiple issues which include remote anterior MI in 1984 treated with thrombolytics and angioplasty. Last cath in 2009 showed a normal left main, 30 to 40% mild LAD, 50% lesion in a small OM. RCA was normal. EF was 50%. She had normal renal arteries on abdominal aortogram. Last echo was in 2008 showing normal EF, distal septal hypokinesis, impaired LV relaxation, and mild TR. Her last Myoview was last May. Her other issues include anxiety, HLD, DM, obesity, nephrolithiasis, depression, stress, and hypothyroidism.   She comes in today. She is here alone. She is doing ok. Weight is up. She has stopped exercising. No chest pain. BP looks ok. She feels ok. Remains stressed and anxious which is not unusual for her. Has had her labs drawn at Dr. Alver Fisher. Sees him tomorrow. We have requested those results. Overall, she feels like she is doing ok.   Current Outpatient Prescriptions on File Prior to Visit  Medication Sig Dispense Refill  . acetaminophen (TYLENOL) 500 MG tablet Take 1,000 mg by mouth every 6 (six) hours as needed. Pain       . ALPRAZolam (XANAX) 0.5 MG tablet Take 1 mg by mouth. ONE TABLET IN AM, TWO TABLETS IN PM AND PRN X1 DURING THE DAY      . aspirin 81 MG tablet Take 81 mg by mouth every morning.       Marland Kitchen atorvastatin (LIPITOR) 40 MG tablet TAKE 1 TABLET DAILY  30 tablet  11  . diltiazem (CARDIZEM CD) 120 MG 24 hr capsule TAKE ONE CAPSULE EVERY DAY  30 capsule  11  . FLUoxetine (PROZAC) 20 MG capsule Take 20 mg by mouth daily with breakfast.       . ibuprofen (ADVIL,MOTRIN) 200 MG tablet Take 200 mg by mouth every 6 (six) hours as needed. Pain       . isometheptene-acetaminophen-dichloralphenazone (MIDRIN) 65-325-100 MG capsule Take 1 capsule by  mouth 4 (four) times daily as needed. Headache       . levothyroxine (SYNTHROID, LEVOTHROID) 150 MCG tablet Take 150 mcg by mouth every morning.       Marland Kitchen losartan (COZAAR) 25 MG tablet TAKE 1 TABLET BY MOUTH EVERY DAY  30 tablet  11  . metFORMIN (GLUCOPHAGE) 1000 MG tablet Take 1,000 mg by mouth 2 (two) times daily.       . metoprolol succinate (TOPROL-XL) 25 MG 24 hr tablet TAKE 1 TABLET DAILY  30 tablet  11  . nitroGLYCERIN (NITROSTAT) 0.4 MG SL tablet Place 0.4 mg under the tongue every 5 (five) minutes as needed. Chest pain       . omeprazole (PRILOSEC) 20 MG capsule Take 20 mg by mouth daily as needed. Heart burn       . Polyethyl Glycol-Propyl Glycol (SYSTANE) 0.4-0.3 % SOLN Place 1 drop into both eyes 2 (two) times daily as needed. Dry eyes      . ZETIA 10 MG tablet TAKE 1 TABLET DAILY  30 tablet  10    Allergies  Allergen Reactions  . Cephalexin Hives    Past Medical History  Diagnosis Date  . Ischemic heart disease   . Diabetes mellitus   . Obesity   . Chronic anxiety   . Nephrolithiasis   .  Lumbar herniated disc   . Depression   . Situational stress   . Hyperlipemia   . History of bronchitis   . History of pneumonia   . Hypothyroidism   . Coronary artery disease CARDIOLOGIST- DR Maylon Cos-- VISIT 04-01-2011 W/ CHART AND IN EPIC  . History of kidney stones S/P URETEROSCOPIC STONE EXTRACTIONS  . Right ureteral calculus   . 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  NO ISCHEMIA. SUSPECT THIS REPRESENTS PRIOR MI BUT PATTERN UNUSUAL GIVEN WORSE DEFECT AT REST. APICAL HYPODINESIS BUT OVERALL PRESERVED EF.  Marland Kitchen History of acute anterior wall MI MARCH 1984    S/P ANGIOPLASTY AND THROMBOLYTICS  . History of hypercalcemia   . History of echocardiogram 05-20-2007    NORMAL LVSF. DISTAL SEPTAL HYPOKINESIA. MILD LEVH W/ IMPAIRED LV RELAXATION. MILD AORTIC SCLEROSIS. MILD TRICUSPID  REGURGITATION  . Arthritis   . GERD (gastroesophageal reflux disease)   . PONV (postoperative nausea and vomiting)   . Panic attack as reaction to stress   . Female bladder prolapse   . Urge urinary incontinence   . Myocardial infarction     1984 MI with angioplasty    Past Surgical History  Procedure Date  . Left ureteroscopic laser litho/ stone extraction 12-16-2009  &  11-17-2007  . Right ureteroscopic stone extraction  07-04-2008  . Coronary angioplasty 1984  . Cardiac catheterization 04-18-2008; 05-08-2004;  11-29-1986; 08-19-1988    2009 REPORT---- MILD ANTERIOR HYPOKINESIS (FROM REMOTE MI), MILD CORONARY ATHEROSCLEROSIS W/ 30-40% NARROWING IN THE MID LAD & 50% NARROWING IN THE SMALL OBTUSE MARGINAL,  NORMAL RENAL ARTERIES W/ NO ABD. AORTIC DISEASE  . Carpal tunnel release 2005    RIGHT  . Extracorporeal shock wave lithotripsy 2007    X2  . Facial cosmetic surgery   . Ureteroscopy 01/25/2012    Procedure: URETEROSCOPY;  Surgeon: Garnett Farm, MD;  Location: [facility];  Service: Urology;  Laterality: Right;    History  Smoking status  . Former Smoker  . Types: Cigarettes  . Quit date: 12/28/1982  Smokeless tobacco  . Never Used    History  Alcohol Use  . Yes    Comment: socially    Family History  Problem Relation Age of Onset  . Heart disease Mother   . Heart attack Father   . Heart disease Brother     Review of Systems: The review of systems is per the HPI.  All other systems were reviewed and are negative.  Physical Exam: BP 124/78  Pulse 78  Ht 5\' 8"  (1.727 m)  Wt 208 lb (94.348 kg)  BMI 31.63 kg/m2 Patient is very pleasant and in no acute distress. She has gained weight. Skin is warm and dry. Color is normal.  HEENT is unremarkable. Normocephalic/atraumatic. PERRL. Sclera are nonicteric. Neck is supple. No masses. No JVD. Lungs are clear. Cardiac exam shows a regular rate and rhythm. Abdomen is soft. Extremities are without edema.  Gait and ROM are intact. No gross neurologic deficits noted.  LABORATORY DATA:   Chemistry      Component Value Date/Time   NA 133* 02/12/2012 2355   K 3.8 02/12/2012 2355   CL 99 02/12/2012 2355   CO2 23 02/12/2012 2355   BUN 24* 02/12/2012 2355   CREATININE 1.32* 02/12/2012 2355      Component Value Date/Time   CALCIUM 10.6* 02/12/2012 2355   ALKPHOS  79 03/31/2011 0951   AST 21 03/31/2011 0951   ALT 17 03/31/2011 0951   BILITOT 0.7 03/31/2011 0951     Lab Results  Component Value Date   WBC 17.0* 02/12/2012   HGB 11.7* 02/12/2012   HCT 34.9* 02/12/2012   MCV 86.4 02/12/2012   PLT 319 02/12/2012   Lab Results  Component Value Date   CHOL 140 03/31/2011   HDL 40.80 03/31/2011   LDLCALC 81 03/31/2011   TRIG 93.0 03/31/2011   CHOLHDL 3 03/31/2011   Myoview Overall Impression from May 2013:  Low risk stress nuclear study. Small anteroapical wall infarction with no ischemia  LV Ejection Fraction: 61%. LV Wall Motion: Surface images show apical hypokinesis Suggest echo to evaluate EF as it is likely lower than indicated  Charlton Haws   Assessment / Plan: 1. CAD - no active chest pain. Managed medically.   2. HTN - blood pressure is ok.  3. HLD - on statin therapy. Have requested labs from Dr. Clelia Croft to review.  4. Chronic anxiety - this has been her most limiting factor for as long as I have known her.  5. Obesity - I have tried to encourage her to get back on track with her diet and weight loss.  We will see her back in 1 year.   Patient is agreeable to this plan and will call if any problems develop in the interim.   Addendum: her labs from Dr. Clelia Croft are reviewed and are excellent. Will get scanned into her record here. I would not change her current medical regimen. I have called Maicey and left her a message on her phone.

## 2013-03-04 IMAGING — CT CT ABD-PELV W/O CM
1 of 2 series · 15 of 32 positions shown, 19 images · non-contrast
Comparison: Abdominal radiograph 02/13/2012 and CT from 05/24/2010

CLINICAL DATA: History kidney stones and recent lithotripsy.

CT ABDOMEN AND PELVIS WITHOUT CONTRAST
TECHNIQUE: Multidetector CT imaging of the abdomen and pelvis was
performed following the standard protocol without intravenous
contrast.

[Series 2: abd/pel w/o · axial · non-contrast · 0.74mm/px · z∈[-470,-25]mm · 15 of 97 slices shown, 19 images]
[im 4/97  soft-tissue]
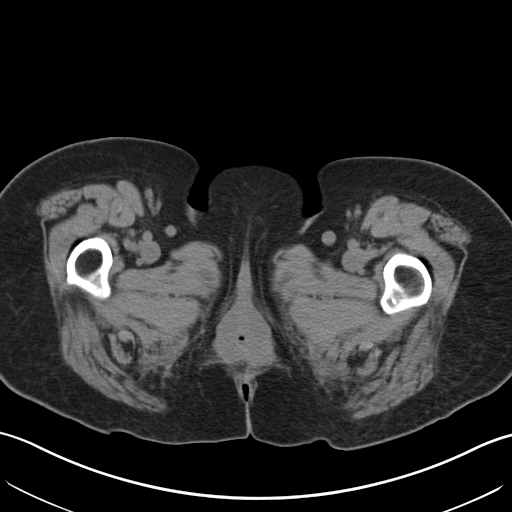
[im 4/97  bone]
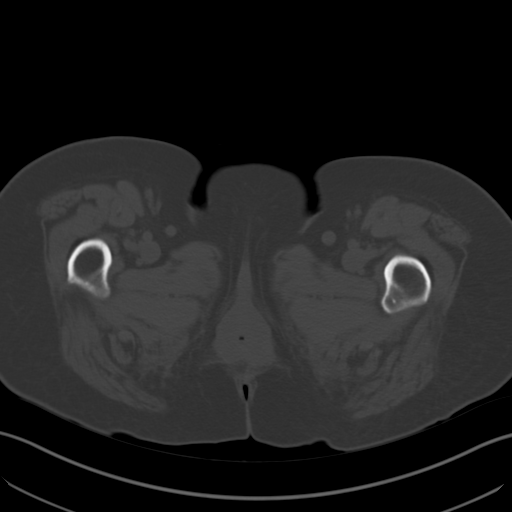
[im 12/97  soft-tissue]
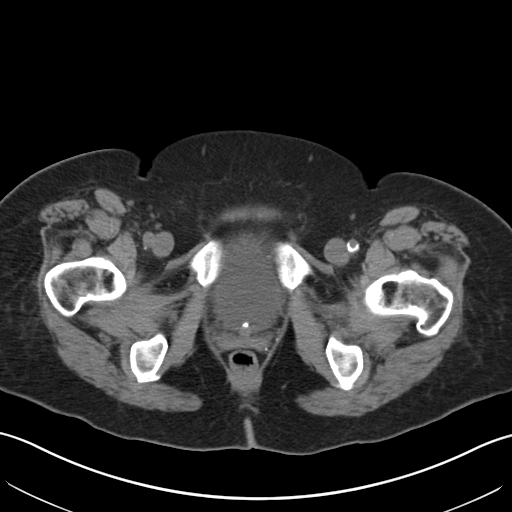
[im 20/97  soft-tissue]
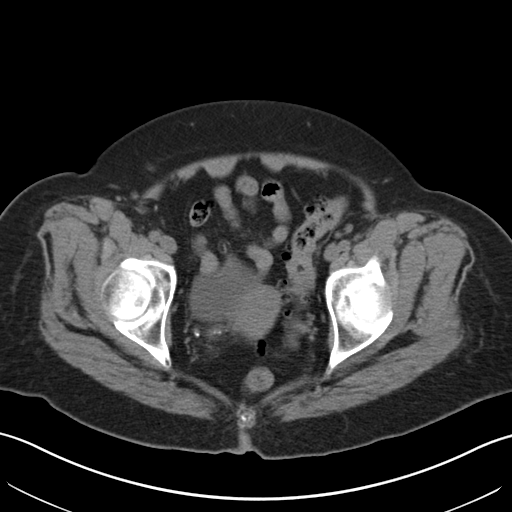
[im 27/97  soft-tissue]
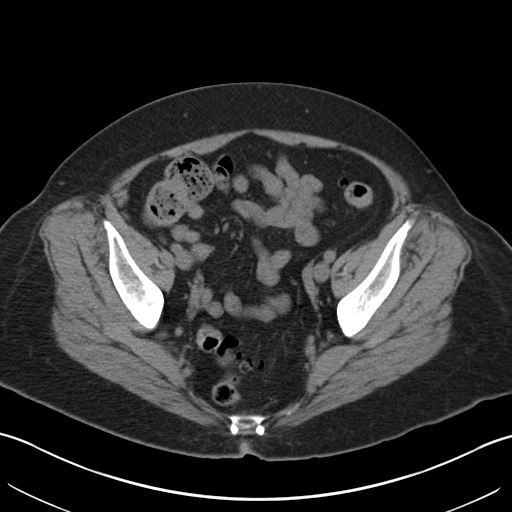
[im 35/97  soft-tissue]
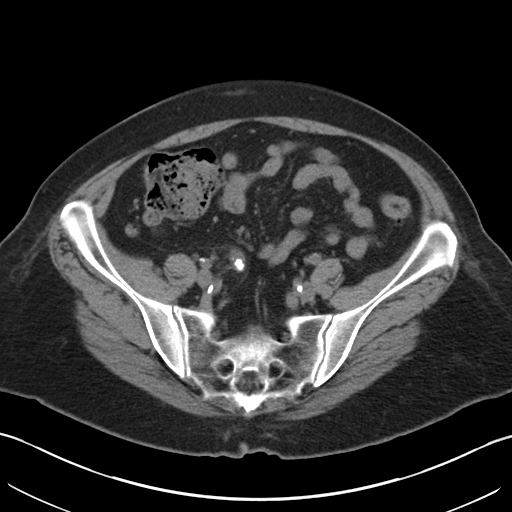
[im 43/97  soft-tissue]
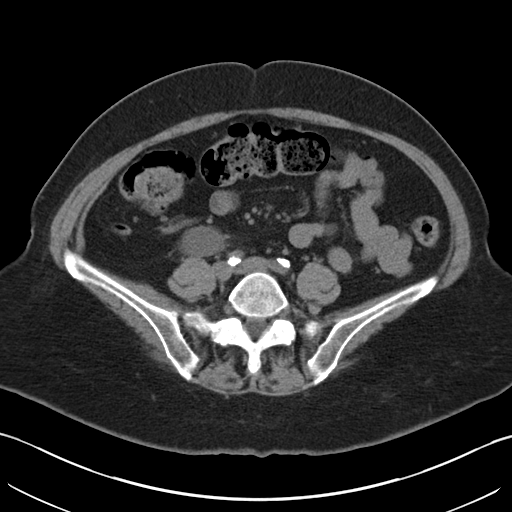
[im 50/97  soft-tissue]
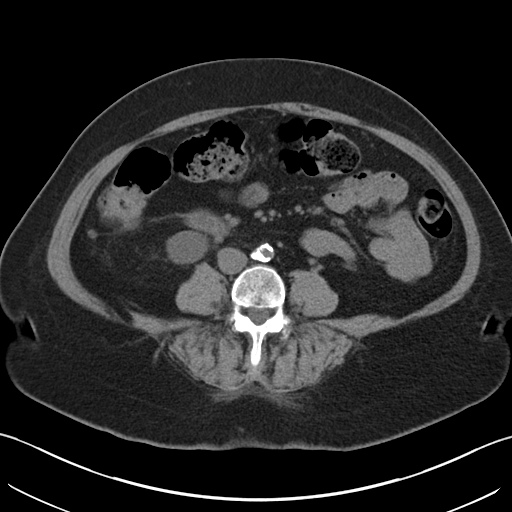
[im 54/97  soft-tissue]
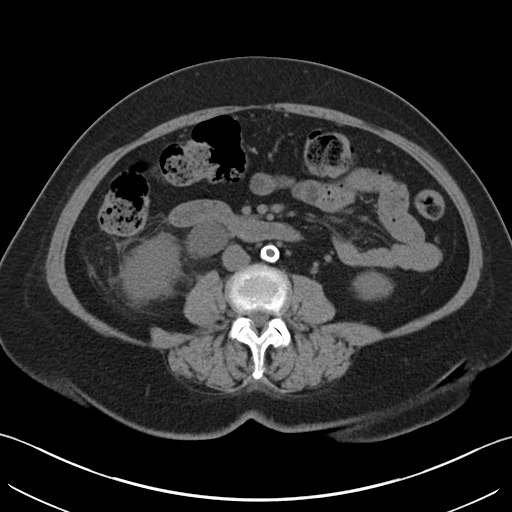
[im 62/97  soft-tissue]
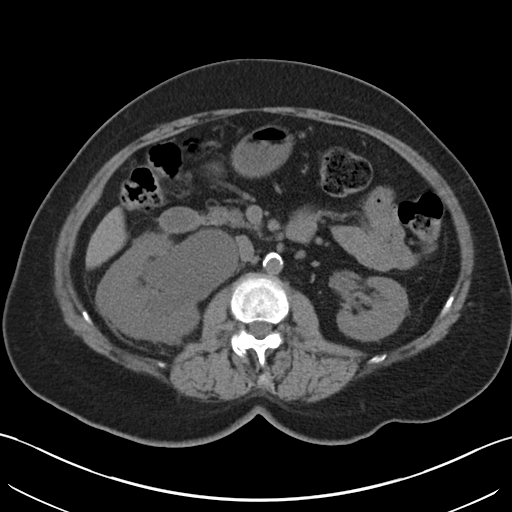
[im 62/97  bone]
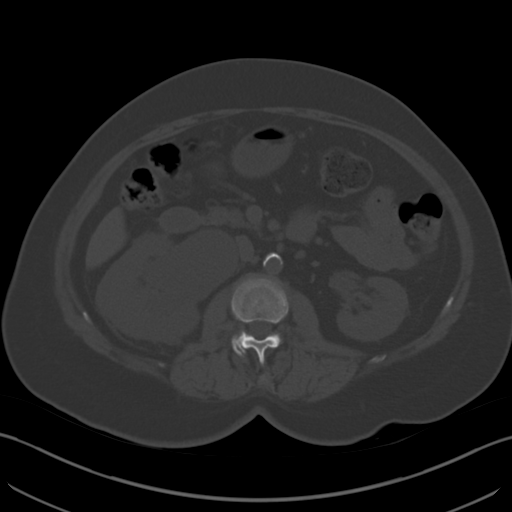
[im 70/97  soft-tissue]
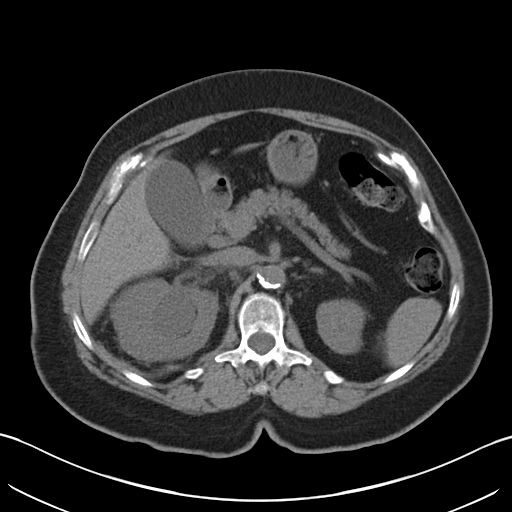
[im 77/97  soft-tissue]
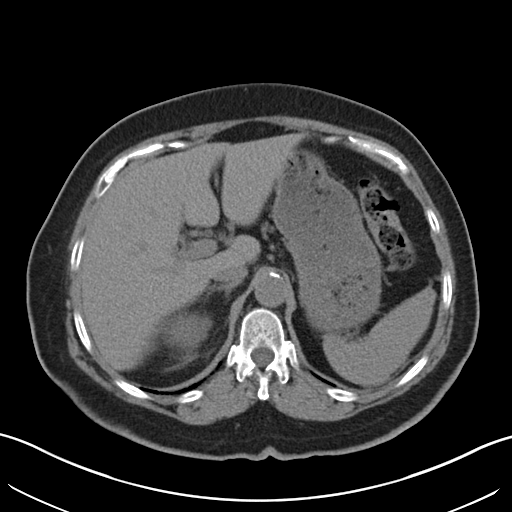
[im 81/97  lung]
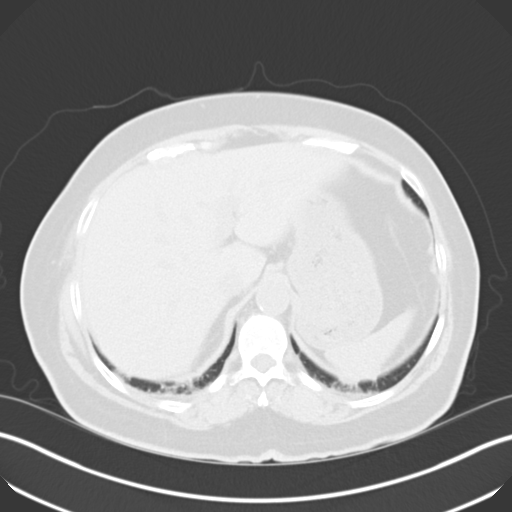
[im 85/97  soft-tissue]
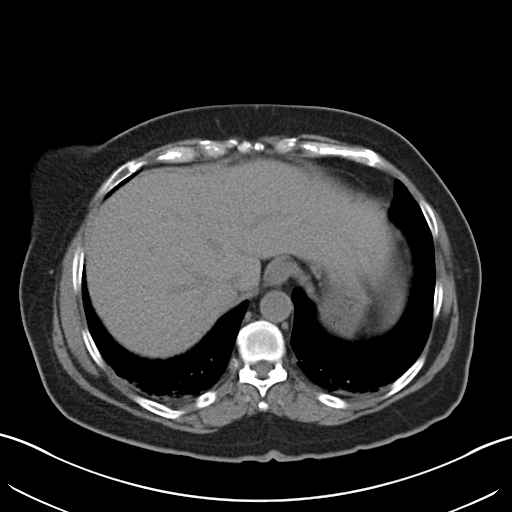
[im 85/97  lung]
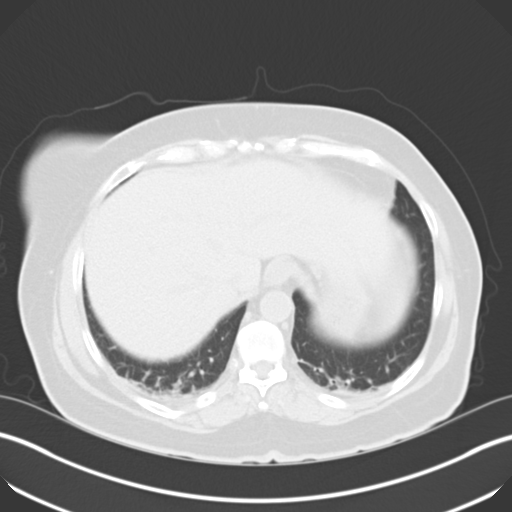
[im 89/97  lung]
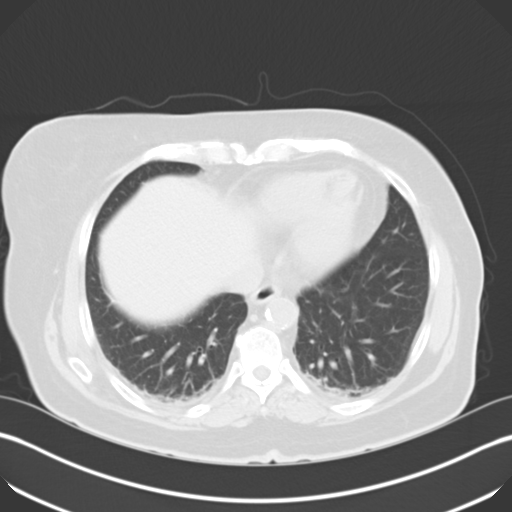
[im 93/97  soft-tissue]
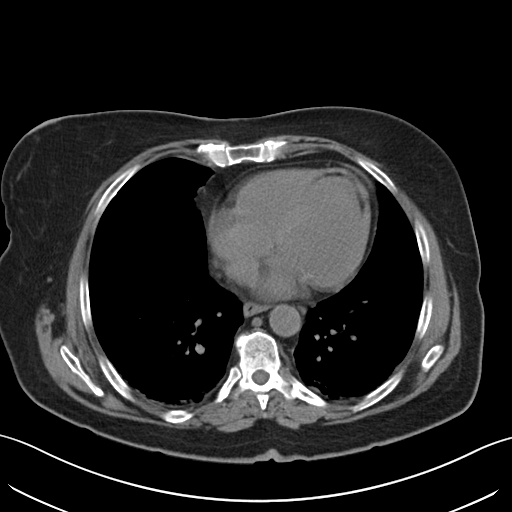
[im 93/97  lung]
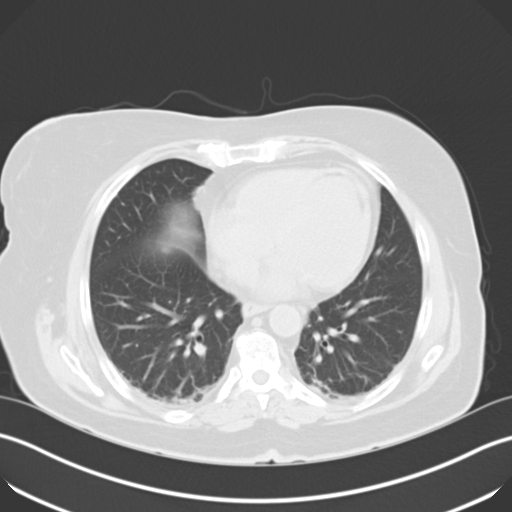

[15 of 32 positions shown; findings below may reference images not displayed]

FINDINGS: The lung bases are clear.  There is no evidence for free
air.

There is severe right hydroureteronephrosis.  The hydronephrosis is
secondary to stones in the mid/distal right ureter.  This may
represent multiple stones with the largest stone measuring up to 1
cm.  The distal ureter is filled with stones.  There are stones at
the right ureterovesical junction and two stones in the bladder.
There are 2 or 3 small stones in the right kidney lower pole.
There is right perinephric stranding related to the
hydroureteronephrosis.

There are at least three stones in the left kidney.  The largest
measures 3 mm in the upper pole.  No evidence for left
hydronephrosis.

No gross abnormality of the liver, gallbladder, spleen, pancreas or
adrenal glands.  Again seen are prominent lymph nodes in the
periaortic region that are similar to the exam in 9311.  No gross
abnormality involving the uterus or adnexa tissue.  Colonic
diverticulosis involving the left colon without acute colonic
inflammation.  Normal appearance of the appendix. No acute bony
abnormality.
IMPRESSION: Severe right hydroureteronephrosis.  There is an obstructing
stone(s) measuring roughly 1 cm in the mid/distal right ureter.

The distal right ureter and right ureterovesical junction are
filled with stones.

Bilateral kidney stones. Urinary bladder stones.

## 2013-03-04 IMAGING — CR DG ABDOMEN 1V
2 series · 2 of 2 positions shown · non-contrast
Comparison: Abdominal radiograph performed 02/11/2012

CLINICAL DATA: Nausea and dry heaves; right-sided abdominal pain.
Status post lithotripsy 1 day ago.

ABDOMEN - 1 VIEW

[t abdomen supine (1 of 2)]
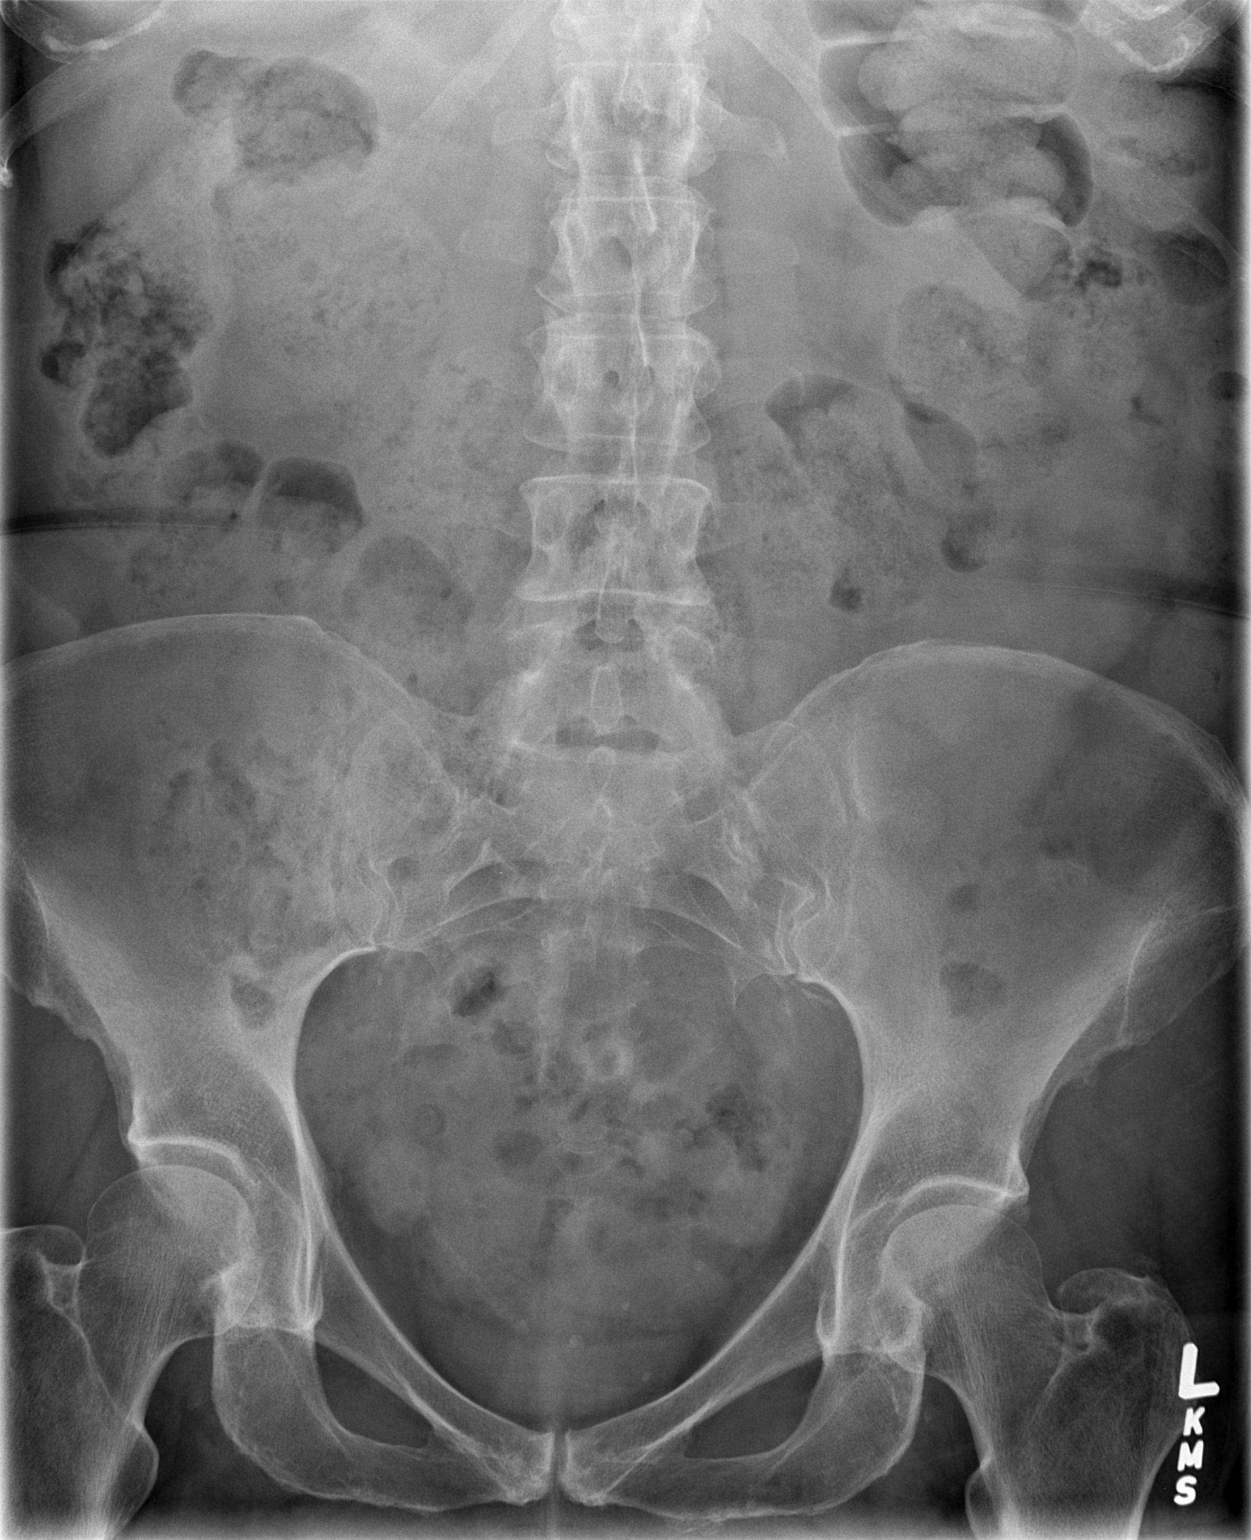

[t abdomen supine (2 of 2)]
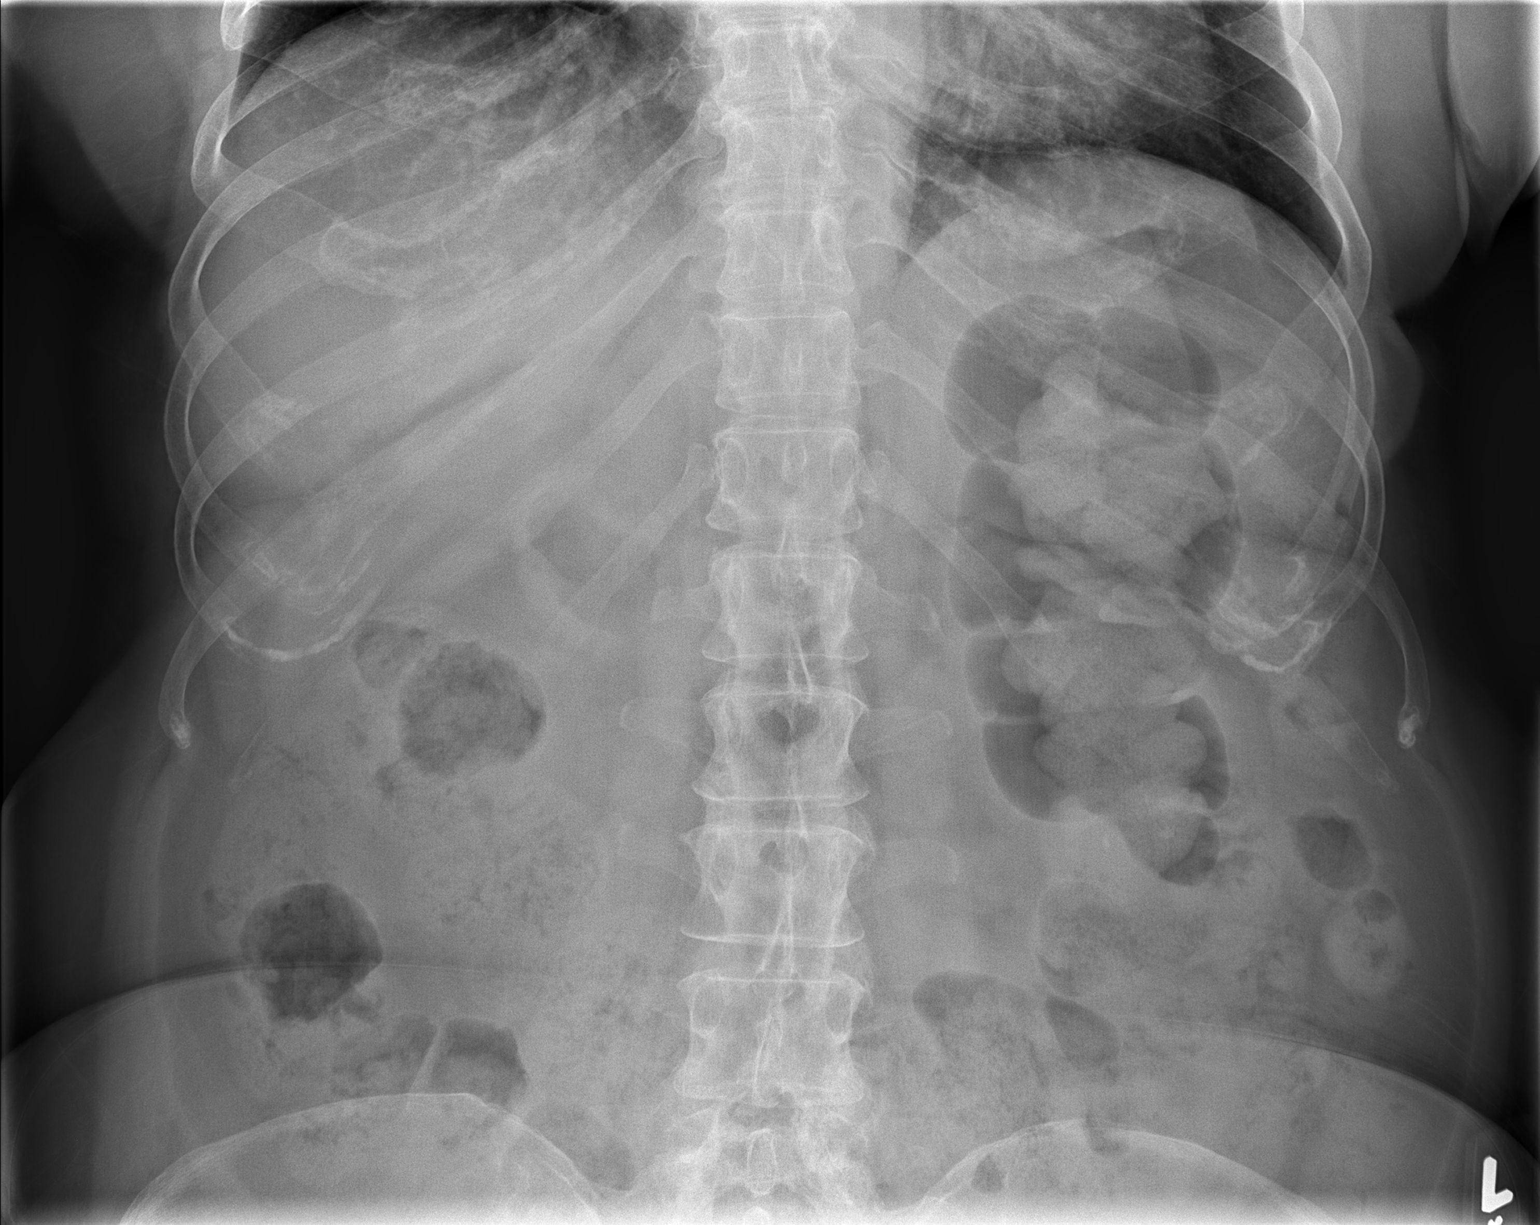

[2 of 2 positions shown; findings below may reference images not displayed]

FINDINGS: The previously noted large stone along the course of the
right ureter is not definitely characterized, but cannot be
excluded given apparent focal density at the same location.  This
is not well assessed due to the increased amount of stool
throughout the colon.

Left-sided nephrolithiasis again noted; right-sided nephrolithiasis
is less well characterized.  No acute osseous abnormalities are
identified.  No free intra-abdominal air is identified, though
evaluation for free air is suboptimal on supine views.
IMPRESSION: 1.  Previously noted large stone along the course of the right
ureter cannot be excluded given apparent focal density at the same
location, but is not definitely delineated.  This may simply
reflect overlying stool.
2.  Increased stool burden likely reflects mild constipation.
3.  Left-sided nephrolithiasis noted.

## 2013-03-30 ENCOUNTER — Ambulatory Visit (INDEPENDENT_AMBULATORY_CARE_PROVIDER_SITE_OTHER): Payer: Medicare Other | Admitting: Physician Assistant

## 2013-03-30 ENCOUNTER — Telehealth: Payer: Self-pay | Admitting: *Deleted

## 2013-03-30 VITALS — BP 120/68 | HR 59 | Ht 69.0 in | Wt 200.1 lb

## 2013-03-30 DIAGNOSIS — I2510 Atherosclerotic heart disease of native coronary artery without angina pectoris: Secondary | ICD-10-CM

## 2013-03-30 DIAGNOSIS — I10 Essential (primary) hypertension: Secondary | ICD-10-CM

## 2013-03-30 DIAGNOSIS — E785 Hyperlipidemia, unspecified: Secondary | ICD-10-CM

## 2013-03-30 DIAGNOSIS — R079 Chest pain, unspecified: Secondary | ICD-10-CM

## 2013-03-30 NOTE — Patient Instructions (Addendum)
NO CHANGES WITH MEDICATIONS TODAY  LABS TODAY; BMET, D-DIMER  PLEASE FOLLOW UP WITH DR. CRENSHAW IN ABOUT 3-4 WEEKS

## 2013-03-30 NOTE — Progress Notes (Signed)
[address] [place], [place]  [address] Phone: [phone] Fax:  [phone]  Date:  03/30/2013   ID:  [name] [name], [name] [name] 04-04-1946, MRN [id number]  PCP:  Kari Baars, MD  Primary Cardiologist:  Dr. Olga Millers    History of Present Illness: [name] [name] is a 67 y.o. female who is added on to my schedule today for the evaluation of CP at the request of Dr. Leslee Home.  She is a prior patient of Dr. Deborah Chalk. She now sees Dr. Jens Som. She has a hx of CAD, s/p anterior MI in 1984 treated with thrombolytics and POBA. Last echo in 5/08: Normal LV function, distal septal HK, impaired LV relaxation, mild TR. LHC 4/09: Normal left main, mid LAD 30-40%, small OM 50%, RCA normal, EF 50%. There was mild anterior wall HK. Abdominal aortogram with normal renal arteries. Myoview 4/12: EF 63%, no ischemia but possible prior anterior infarct. Myoview 5/13:  Low risk, anteroapical scar, no ischemia, EF 61%.  She was in [place] in late Feb and fell in a pot hole and suffered a tib/fib fracture on the left.  She has been in a cast since.  Her husband was helping her out to the car a week later and they both fell but [name] broke her left wrist.  She has been using her arms to move her along in a wheelchair since.  She notes occasional chest tightness mainly with upper body movement.  She denies CP reminiscent of her prior angina.  No radiating symptoms.  No dyspnea.  No pleuritic CP.  No pain with lying flat.  No orthopnea, PND, edema. No syncope or cough.  No hemoptysis.     Labs (2/13):  K 3.8, Cr 1.32, Hgb 11.7  Wt Readings from Last 3 Encounters:  03/30/13 200 lb 1.9 oz (90.774 kg)  01/18/13 208 lb (94.348 kg)  05/26/12 187 lb (84.823 kg)     Past Medical History  Diagnosis Date  . Ischemic heart disease   . Diabetes mellitus   . Obesity   . Chronic anxiety   . Nephrolithiasis   . Lumbar herniated disc   . Depression   . Situational stress   .  Hyperlipemia   . History of bronchitis   . History of pneumonia   . Hypothyroidism   . Coronary artery disease CARDIOLOGIST- DR Maylon Cos-- VISIT 04-01-2011 W/ CHART AND IN EPIC  . History of kidney stones S/P URETEROSCOPIC STONE EXTRACTIONS  . Right ureteral calculus   . 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  NO ISCHEMIA. SUSPECT THIS REPRESENTS PRIOR MI BUT PATTERN UNUSUAL GIVEN WORSE DEFECT AT REST. APICAL HYPODINESIS BUT OVERALL PRESERVED EF.  Marland Kitchen History of acute anterior wall MI MARCH 1984    S/P ANGIOPLASTY AND THROMBOLYTICS  . History of hypercalcemia   . History of echocardiogram 05-20-2007    NORMAL LVSF. DISTAL SEPTAL HYPOKINESIA. MILD LEVH W/ IMPAIRED LV RELAXATION. MILD AORTIC SCLEROSIS. MILD TRICUSPID REGURGITATION  . Arthritis   . GERD (gastroesophageal reflux disease)   . PONV (postoperative nausea and vomiting)   . Panic attack as reaction to stress   . Female bladder prolapse   . Urge urinary incontinence   . Myocardial infarction     1984 MI with angioplasty    Current Outpatient Prescriptions  Medication Sig Dispense Refill  . acetaminophen (TYLENOL) 500 MG tablet Take  1,000 mg by mouth every 6 (six) hours as needed. Pain       . ALPRAZolam (XANAX) 0.5 MG tablet Take 1 mg by mouth. ONE TABLET IN AM, TWO TABLETS IN PM AND PRN X1 DURING THE DAY      . aspirin 81 MG tablet Take 81 mg by mouth every morning.       Marland Kitchen atorvastatin (LIPITOR) 40 MG tablet TAKE 1 TABLET DAILY  30 tablet  11  . diltiazem (CARDIZEM CD) 120 MG 24 hr capsule TAKE ONE CAPSULE EVERY DAY  30 capsule  11  . Ergocalciferol (VITAMIN D2 PO) Take one tablet by mouth daily      . FLUoxetine (PROZAC) 20 MG capsule Take 20 mg by mouth daily with breakfast.       . ibuprofen (ADVIL,MOTRIN) 200 MG tablet Take 200 mg by mouth every 6 (six) hours as needed. Pain       .  isometheptene-acetaminophen-dichloralphenazone (MIDRIN) 65-325-100 MG capsule Take 1 capsule by mouth 4 (four) times daily as needed. Headache       . levothyroxine (SYNTHROID, LEVOTHROID) 150 MCG tablet Take 150 mcg by mouth every morning.       Marland Kitchen losartan (COZAAR) 25 MG tablet TAKE 1 TABLET BY MOUTH EVERY DAY  30 tablet  11  . metFORMIN (GLUCOPHAGE) 1000 MG tablet Take 1,000 mg by mouth 2 (two) times daily.       . metoprolol succinate (TOPROL-XL) 25 MG 24 hr tablet TAKE 1 TABLET DAILY  30 tablet  11  . nitroGLYCERIN (NITROSTAT) 0.4 MG SL tablet Place 0.4 mg under the tongue every 5 (five) minutes as needed. Chest pain       . omeprazole (PRILOSEC) 20 MG capsule Take 20 mg by mouth daily as needed. Heart burn       . Polyethyl Glycol-Propyl Glycol (SYSTANE) 0.4-0.3 % SOLN Place 1 drop into both eyes 2 (two) times daily as needed. Dry eyes      . Tamsulosin HCl (FLOMAX) 0.4 MG CAPS Take one capsule by mouth daily after supper as needed      . ZETIA 10 MG tablet TAKE 1 TABLET DAILY  30 tablet  10  . furosemide (LASIX) 20 MG tablet Take 1/2 to 1 pill daily as needed       No current facility-administered medications for this visit.    Allergies:    Allergies  Allergen Reactions  . Cephalexin Hives    Social History:  The patient  reports that she quit smoking about 30 years ago. Her smoking use included Cigarettes. She smoked 0.00 packs per day. She has never used smokeless tobacco. She reports that  drinks alcohol.   ROS:  Please see the history of present illness.   All other systems reviewed and negative.   PHYSICAL EXAM: VS:  BP 120/68  Pulse 59  Ht 5\' 9"  (1.753 m)  Wt 200 lb 1.9 oz (90.774 kg)  BMI 29.54 kg/m2  SpO2 99% Well nourished, well developed, in no acute distress HEENT: normal Neck: no JVD at 90 degrees Cardiac:  normal S1, S2; RRR; no murmur Lungs:  clear to auscultation bilaterally, no wheezing, rhonchi or rales Abd: soft, nontender, no hepatomegaly Ext: no  edema Skin: warm and dry Neuro:  CNs 2-12 intact, no focal abnormalities noted  EKG:  Sinus brady, HR 59, normal axis, no acute changes, no change from prior tracing     ASSESSMENT AND PLAN:  1. Chest Pain:  Quit atypical  for ischemia.  It is not like prior angina.  She had a low risk nuclear study less than one year ago.  Her ECG is normal.  The likelihood of progressive CAD is low. She has been inactive for several weeks.  She has not been ambulatory at all.  I would be somewhat concerned about a PE in her.  I recommend a DDimer and a BMET.  If the DDimer is abnormal, I will have her undergo a CT to rule out PE.  If normal, no further workup.   2. CAD:  As noted, doubt CP is cardiac.  Check a ddimer.  Continue ASA, statin, beta blocker. 3. Hyperlipidemia:  Continue statin. 4. Hypertension:  Controlled.  Continue current therapy.  5. Disposition:  F/u with Dr. Olga Millers in 3-4 weeks.  She knows to return sooner if symptoms persist or worsen.     Signed, Tereso Newcomer, PA-C  3:50 PM 03/30/2013

## 2013-03-30 NOTE — Telephone Encounter (Signed)
Dr Jens Som spoke with dr Leslee Home regarding this pt. The pt is having chest pain and dr applington wanted the pt seen today. The pt will be seen today @ 3 pm by scott weaver pac

## 2013-03-31 ENCOUNTER — Telehealth: Payer: Self-pay | Admitting: *Deleted

## 2013-03-31 LAB — BASIC METABOLIC PANEL
CO2: 30 mEq/L (ref 19–32)
Chloride: 99 mEq/L (ref 96–112)
Sodium: 137 mEq/L (ref 135–145)

## 2013-03-31 NOTE — Telephone Encounter (Signed)
pt notified about lab results today with verbal understanding  

## 2013-03-31 NOTE — Telephone Encounter (Signed)
pt notified about d-dimer results normal with verbal understanding, has appt 5/2 with Dr. Jens Som advised pt to keep f/u, pt said ok

## 2013-03-31 NOTE — Telephone Encounter (Signed)
Message copied by Tarri Fuller on Fri Mar 31, 2013 12:31 PM ------      Message from: [place], [place] T      Created: Thu Mar 30, 2013 10:22 PM       DDimer normal.      No further workup.      Follow up as planned or sooner if chest symptoms persist/worsen.      Tereso Newcomer, PA-C  10:22 PM 03/30/2013 ------

## 2013-04-04 ENCOUNTER — Other Ambulatory Visit: Payer: Self-pay

## 2013-04-04 ENCOUNTER — Other Ambulatory Visit: Payer: Self-pay | Admitting: *Deleted

## 2013-04-04 DIAGNOSIS — Z1231 Encounter for screening mammogram for malignant neoplasm of breast: Secondary | ICD-10-CM

## 2013-04-04 MED ORDER — EZETIMIBE 10 MG PO TABS: 10.0000 mg | ORAL_TABLET | Freq: Every day | ORAL | Status: DC

## 2013-04-28 ENCOUNTER — Encounter: Payer: Self-pay | Admitting: Cardiology

## 2013-04-28 ENCOUNTER — Ambulatory Visit (INDEPENDENT_AMBULATORY_CARE_PROVIDER_SITE_OTHER): Payer: Medicare Other | Admitting: Cardiology

## 2013-04-28 ENCOUNTER — Other Ambulatory Visit: Payer: Self-pay | Admitting: Cardiology

## 2013-04-28 VITALS — BP 127/81 | HR 67 | Wt 199.0 lb

## 2013-04-28 DIAGNOSIS — R079 Chest pain, unspecified: Secondary | ICD-10-CM

## 2013-04-28 DIAGNOSIS — I2510 Atherosclerotic heart disease of native coronary artery without angina pectoris: Secondary | ICD-10-CM

## 2013-04-28 DIAGNOSIS — E785 Hyperlipidemia, unspecified: Secondary | ICD-10-CM

## 2013-04-28 DIAGNOSIS — I10 Essential (primary) hypertension: Secondary | ICD-10-CM

## 2013-04-28 NOTE — Assessment & Plan Note (Signed)
Continue aspirin and statin. 

## 2013-04-28 NOTE — Progress Notes (Signed)
HPI: Pleasant female for fu of CAD. Patient had an anterior myocardial infarction in 1984 that was treated with thrombolytics and angioplasty. Last echocardiogram in 2008 showed normal function, distal septal hypokinesis, impaired LV relaxation, mild tricuspid regurgitation. Cardiac catheterization in April of 2009 showed a normal left main, a 30-40% mid LAD. There was a 50% lesion in a small obtuse marginal. The right coronary was normal. The ejection fraction was 50%. There was mild anterior wall hypokinesis. Abdominal aortogram showed normal renal arteries. There was no iliac disease. Last Myoview performed in May of 2013 showed an ejection fraction of 61%. There was a small anteroapical infarct but no ischemia. Patient seen earlier this month by Tereso Newcomer for atypical chest pain. D-dimer normal. Since that time, she continues to have occasional chest pain. Her pain typically occurs with stress. She also notices this with working. She has had this intermittently for 30 years. There is some radiation to her upper extremity and there is relief with Xanax. She did have some chest soreness after she fell recently that has improved. She has some dyspnea on exertion but no orthopnea, PND or syncope.   Current Outpatient Prescriptions  Medication Sig Dispense Refill  . acetaminophen (TYLENOL) 500 MG tablet Take 1,000 mg by mouth every 6 (six) hours as needed. Pain       . ALPRAZolam (XANAX) 0.5 MG tablet Take 1 mg by mouth. ONE TABLET IN AM, TWO TABLETS IN PM AND PRN X1 DURING THE DAY      . aspirin 81 MG tablet Take 81 mg by mouth every morning.       Marland Kitchen atorvastatin (LIPITOR) 40 MG tablet TAKE 1 TABLET DAILY  30 tablet  11  . diltiazem (CARDIZEM CD) 120 MG 24 hr capsule TAKE ONE CAPSULE EVERY DAY  30 capsule  11  . Ergocalciferol (VITAMIN D2 PO) Take one tablet by mouth daily      . ezetimibe (ZETIA) 10 MG tablet Take 1 tablet (10 mg total) by mouth daily.  30 tablet  3  . FLUoxetine (PROZAC) 20 MG  capsule Take 20 mg by mouth daily with breakfast.       . furosemide (LASIX) 20 MG tablet Take 1/2 to 1 pill daily as needed      . ibuprofen (ADVIL,MOTRIN) 200 MG tablet Take 200 mg by mouth every 6 (six) hours as needed. Pain       . isometheptene-acetaminophen-dichloralphenazone (MIDRIN) 65-325-100 MG capsule Take 1 capsule by mouth 4 (four) times daily as needed. Headache       . levothyroxine (SYNTHROID, LEVOTHROID) 150 MCG tablet Take 150 mcg by mouth every morning.       Marland Kitchen losartan (COZAAR) 25 MG tablet TAKE 1 TABLET BY MOUTH EVERY DAY  30 tablet  11  . metFORMIN (GLUCOPHAGE) 1000 MG tablet Take 1,000 mg by mouth 2 (two) times daily.       . metoprolol succinate (TOPROL-XL) 25 MG 24 hr tablet TAKE 1 TABLET DAILY  30 tablet  11  . nitroGLYCERIN (NITROSTAT) 0.4 MG SL tablet Place 0.4 mg under the tongue every 5 (five) minutes as needed. Chest pain       . omeprazole (PRILOSEC) 20 MG capsule Take 20 mg by mouth daily as needed. Heart burn       . Polyethyl Glycol-Propyl Glycol (SYSTANE) 0.4-0.3 % SOLN Place 1 drop into both eyes 2 (two) times daily as needed. Dry eyes      . Tamsulosin HCl (FLOMAX) 0.4  MG CAPS Take one capsule by mouth daily after supper as needed       No current facility-administered medications for this visit.     Past Medical History  Diagnosis Date  . Ischemic heart disease   . Diabetes mellitus   . Obesity   . Chronic anxiety   . Nephrolithiasis   . Lumbar herniated disc   . Depression   . Situational stress   . Hyperlipemia   . History of bronchitis   . History of pneumonia   . Hypothyroidism   . Coronary artery disease CARDIOLOGIST- DR Maylon Cos-- VISIT 04-01-2011 W/ CHART AND IN EPIC  . History of kidney stones S/P URETEROSCOPIC STONE EXTRACTIONS  . Right ureteral calculus   . 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  NO ISCHEMIA. SUSPECT THIS  REPRESENTS PRIOR MI BUT PATTERN UNUSUAL GIVEN WORSE DEFECT AT REST. APICAL HYPODINESIS BUT OVERALL PRESERVED EF.  Marland Kitchen History of acute anterior wall MI MARCH 1984    S/P ANGIOPLASTY AND THROMBOLYTICS  . History of hypercalcemia   . History of echocardiogram 05-20-2007    NORMAL LVSF. DISTAL SEPTAL HYPOKINESIA. MILD LEVH W/ IMPAIRED LV RELAXATION. MILD AORTIC SCLEROSIS. MILD TRICUSPID REGURGITATION  . Arthritis   . GERD (gastroesophageal reflux disease)   . PONV (postoperative nausea and vomiting)   . Panic attack as reaction to stress   . Female bladder prolapse   . Urge urinary incontinence   . Myocardial infarction     1984 MI with angioplasty    Past Surgical History  Procedure Laterality Date  . Left ureteroscopic laser litho/ stone extraction  12-16-2009  &  11-17-2007  . Right ureteroscopic stone extraction   07-04-2008  . Coronary angioplasty  1984  . Cardiac catheterization  04-18-2008; 05-08-2004;  11-29-1986; 08-19-1988    2009 REPORT---- MILD ANTERIOR HYPOKINESIS (FROM REMOTE MI), MILD CORONARY ATHEROSCLEROSIS W/ 30-40% NARROWING IN THE MID LAD & 50% NARROWING IN THE SMALL OBTUSE MARGINAL,  NORMAL RENAL ARTERIES W/ NO ABD. AORTIC DISEASE  . Carpal tunnel release  2005    RIGHT  . Extracorporeal shock wave lithotripsy  2007    X2  . Facial cosmetic surgery    . Ureteroscopy  01/25/2012    Procedure: URETEROSCOPY;  Surgeon: Garnett Farm, MD;  Location: [facility];  Service: Urology;  Laterality: Right;    History   Social History  . Marital Status: Married    Spouse Name: N/A    Number of Children: N/A  . Years of Education: N/A   Occupational History  . Not on file.   Social History Main Topics  . Smoking status: Former Smoker    Types: Cigarettes    Quit date: 12/28/1982  . Smokeless tobacco: Never Used  . Alcohol Use: Yes     Comment: socially  . Drug Use: Not on file  . Sexually Active: Not on file   Other Topics Concern  . Not on file     Social History Narrative  . No narrative on file    ROS: no fevers or chills, productive cough, hemoptysis, dysphasia, odynophagia, melena, hematochezia, dysuria, hematuria, rash, seizure activity, orthopnea, PND, pedal edema, claudication. Remaining systems are negative.  Physical Exam: Well-developed well-nourished in no acute distress.  Skin is warm and dry.  HEENT is normal.  Neck is supple.  Chest is clear to auscultation with normal expansion.  Cardiovascular  exam is regular rate and rhythm.  Abdominal exam nontender or distended. No masses palpated. Extremities show no edema. Status post surgery on right lower extremity and right arm. neuro grossly intact  Sinus rhythm at a rate of 65. Nonspecific ST changes. Left ventricular hypertrophy.

## 2013-04-28 NOTE — Assessment & Plan Note (Signed)
Patient has problems with chronic chest pain and anxiety. Plan Myoview for risk stratification.

## 2013-04-28 NOTE — Patient Instructions (Signed)
Your physician wants you to follow-up in: ONE YEAR WITH DR CRENSHAW You will receive a reminder letter in the mail two months in advance. If you don't receive a letter, please call our office to schedule the follow-up appointment.   Your physician has requested that you have a lexiscan myoview. For further information please visit [email or web address]. Please follow instruction sheet, as given.   

## 2013-04-28 NOTE — Assessment & Plan Note (Signed)
Continue statin. 

## 2013-04-28 NOTE — Assessment & Plan Note (Signed)
Continue present blood pressure medications. 

## 2013-05-02 ENCOUNTER — Ambulatory Visit (HOSPITAL_COMMUNITY): Payer: Medicare Other | Attending: Cardiovascular Disease | Admitting: Radiology

## 2013-05-02 VITALS — BP 118/73 | HR 56 | Ht 69.0 in | Wt 200.0 lb

## 2013-05-02 DIAGNOSIS — I2510 Atherosclerotic heart disease of native coronary artery without angina pectoris: Secondary | ICD-10-CM

## 2013-05-02 DIAGNOSIS — R0609 Other forms of dyspnea: Secondary | ICD-10-CM | POA: Insufficient documentation

## 2013-05-02 DIAGNOSIS — R079 Chest pain, unspecified: Secondary | ICD-10-CM

## 2013-05-02 DIAGNOSIS — E785 Hyperlipidemia, unspecified: Secondary | ICD-10-CM | POA: Insufficient documentation

## 2013-05-02 DIAGNOSIS — Z950 Presence of cardiac pacemaker: Secondary | ICD-10-CM | POA: Insufficient documentation

## 2013-05-02 DIAGNOSIS — R5381 Other malaise: Secondary | ICD-10-CM | POA: Insufficient documentation

## 2013-05-02 DIAGNOSIS — R0789 Other chest pain: Secondary | ICD-10-CM | POA: Insufficient documentation

## 2013-05-02 DIAGNOSIS — R0602 Shortness of breath: Secondary | ICD-10-CM

## 2013-05-02 DIAGNOSIS — I252 Old myocardial infarction: Secondary | ICD-10-CM | POA: Insufficient documentation

## 2013-05-02 DIAGNOSIS — R0989 Other specified symptoms and signs involving the circulatory and respiratory systems: Secondary | ICD-10-CM | POA: Insufficient documentation

## 2013-05-02 DIAGNOSIS — I10 Essential (primary) hypertension: Secondary | ICD-10-CM

## 2013-05-02 DIAGNOSIS — E119 Type 2 diabetes mellitus without complications: Secondary | ICD-10-CM | POA: Insufficient documentation

## 2013-05-02 DIAGNOSIS — Z87891 Personal history of nicotine dependence: Secondary | ICD-10-CM | POA: Insufficient documentation

## 2013-05-02 DIAGNOSIS — Z8249 Family history of ischemic heart disease and other diseases of the circulatory system: Secondary | ICD-10-CM | POA: Insufficient documentation

## 2013-05-02 MED ORDER — TECHNETIUM TC 99M SESTAMIBI GENERIC - CARDIOLITE
30.0000 | Freq: Once | INTRAVENOUS | Status: AC | PRN
  Administered 2013-05-02: 30 via INTRAVENOUS

## 2013-05-02 MED ORDER — REGADENOSON 0.4 MG/5ML IV SOLN
0.4000 mg | Freq: Once | INTRAVENOUS | Status: AC
  Administered 2013-05-02: 0.4 mg via INTRAVENOUS

## 2013-05-02 MED ORDER — TECHNETIUM TC 99M SESTAMIBI GENERIC - CARDIOLITE
10.0000 | Freq: Once | INTRAVENOUS | Status: AC | PRN
  Administered 2013-05-02: 10 via INTRAVENOUS

## 2013-05-02 NOTE — Progress Notes (Signed)
[facility] SITE 3 NUCLEAR MED [address] [place], [place] [address] [phone]    Cardiology Nuclear Med Study  [name] [name] is a 67 y.o. female     MRN : [id number]     DOB: May 14, 1946  Procedure Date: 05/02/2013  Nuclear Med Background Indication for Stress Test:  Evaluation for Ischemia and PTCA Patency History:  '84 AWMI>PTCA-LAD; '08 Echo:EF=60%; '09 Cath:n/o CAD, EF=50%; '13 OZH:YQMVH antero-apical infarct, no ischemia, EF=63% Cardiac Risk Factors: Family History - CAD, History of Smoking, Hypertension, Lipids and NIDDM  Symptoms:  Chest Pain/"Soreness" with and without Exertion (last episode of chest discomfort was about 2-weeks ago), DOE and Fatigue   Nuclear Pre-Procedure Caffeine/Decaff Intake:  None > 12 hrs NPO After: 6:00pm   Lungs:  Clear. O2 Sat: 96% on room air. IV 0.9% NS with Angio Cath:  22g  IV Site: L Antecubital x 1, tolerated well IV Started by:  Irean Hong, RN  Chest Size (in):  42 Cup Size: C  Height: 5\' 9"  (1.753 m)  Weight:  200 lb (90.719 kg)  BMI:  Body mass index is 29.52 kg/(m^2). Tech Comments:  Toprol taken @ 6:00 am; no metformin or lasix this am    Nuclear Med Study 1 or 2 day study: 1 day  Stress Test Type:  Treadmill/Lexiscan  Reading MD: Charlton Haws, MD  Order Authorizing Provider:  Olga Millers, MD  Resting Radionuclide: Technetium 5m Sestamibi  Resting Radionuclide Dose: 11.0 mCi   Stress Radionuclide:  Technetium 51m Sestamibi  Stress Radionuclide Dose: 33.0 mCi           Stress Protocol Rest HR: 56 Stress HR: 111  Rest BP: 118/73 Stress BP: 163/68  Exercise Time (min): 2:00 METS: n/a   Predicted Max HR: 152 bpm % Max HR: 73.03 bpm Rate Pressure Product: 84696   Dose of Adenosine (mg):  n/a Dose of Lexiscan: 0.4 mg  Dose of Atropine (mg): n/a Dose of Dobutamine: n/a mcg/kg/min (at max HR)  Stress Test Technologist: Smiley Houseman, CMA-N  Nuclear Technologist:  Domenic Polite, CNMT     Rest  Procedure:  Myocardial perfusion imaging was performed at rest 45 minutes following the intravenous administration of Technetium 23m Sestamibi.  Rest ECG: NSR - Normal EKG  Stress Procedure:  The patient received IV Lexiscan 0.4 mg over 15-seconds with concurrent low level exercise and then Technetium 45m Sestamibi was injected at 30-seconds while the patient continued walking one more minute.  Quantitative spect images were obtained after a 45-minute delay.  Stress ECG: No significant change from baseline ECG  QPS Raw Data Images:  Patient motion noted. Stress Images:  There is decreased uptake in the anterior wall. Rest Images:  There is decreased uptake in the anterior wall. Subtraction (SDS):  SDS 6 abnormal in the anterior apex Transient Ischemic Dilatation (Normal <1.22):  1.10 Lung/Heart Ratio (Normal <0.45):  0.28  Quantitative Gated Spect Images QGS EDV:  79 ml QGS ESV:  32 ml  Impression Exercise Capacity:  Lexiscan with low level exercise. BP Response:  Normal blood pressure response. Clinical Symptoms:  Headache and stomach pain ECG Impression:  No significant ST segment change suggestive of ischemia. Comparison with Prior Nuclear Study: No images to compare  Overall Impression:  Intermediate risk stress nuclear study Small anteroapical wall infarct with moderate periinfarct ischemia involving the mid and apical anterior wall EF reported as normal but surface images show signficiant antero apical hypokinesis.  LV Ejection Fraction: 59%.  LV  Wall Motion:  See caveat above   Charlton Haws

## 2013-05-03 ENCOUNTER — Ambulatory Visit: Payer: Medicare Other

## 2013-05-04 ENCOUNTER — Ambulatory Visit
Admission: RE | Admit: 2013-05-04 | Discharge: 2013-05-04 | Disposition: A | Discharge status: Home / Self Care | Payer: Medicare Other | Source: Ambulatory Visit

## 2013-05-04 DIAGNOSIS — Z1231 Encounter for screening mammogram for malignant neoplasm of breast: Secondary | ICD-10-CM

## 2013-05-10 ENCOUNTER — Ambulatory Visit (INDEPENDENT_AMBULATORY_CARE_PROVIDER_SITE_OTHER)
Admission: RE | Admit: 2013-05-10 | Discharge: 2013-05-10 | Disposition: A | Discharge status: Home / Self Care | Payer: Medicare Other | Source: Ambulatory Visit | Attending: Cardiology | Admitting: Cardiology

## 2013-05-10 ENCOUNTER — Encounter: Payer: Self-pay | Admitting: *Deleted

## 2013-05-10 ENCOUNTER — Encounter: Payer: Self-pay | Admitting: Cardiology

## 2013-05-10 ENCOUNTER — Ambulatory Visit (INDEPENDENT_AMBULATORY_CARE_PROVIDER_SITE_OTHER): Payer: Medicare Other | Admitting: Cardiology

## 2013-05-10 ENCOUNTER — Other Ambulatory Visit: Payer: Self-pay | Admitting: Cardiology

## 2013-05-10 VITALS — BP 130/80 | HR 76 | Wt 202.0 lb

## 2013-05-10 DIAGNOSIS — I10 Essential (primary) hypertension: Secondary | ICD-10-CM

## 2013-05-10 DIAGNOSIS — R079 Chest pain, unspecified: Secondary | ICD-10-CM

## 2013-05-10 DIAGNOSIS — E785 Hyperlipidemia, unspecified: Secondary | ICD-10-CM

## 2013-05-10 DIAGNOSIS — I2510 Atherosclerotic heart disease of native coronary artery without angina pectoris: Secondary | ICD-10-CM

## 2013-05-10 DIAGNOSIS — R072 Precordial pain: Secondary | ICD-10-CM

## 2013-05-10 LAB — BASIC METABOLIC PANEL
CO2: 26 mEq/L (ref 19–32)
GFR: 68.76 mL/min (ref >60.00)
Glucose, Bld: 84 mg/dL (ref 70–99)
Potassium: 4.3 mEq/L (ref 3.5–5.1)
Sodium: 137 mEq/L (ref 135–145)

## 2013-05-10 LAB — CBC WITH DIFFERENTIAL/PLATELET
Basophils Relative: 0.3 % (ref 0.0–3.0)
Eosinophils Relative: 3.2 % (ref 0.0–5.0)
HCT: 38.3 % (ref 36.0–46.0)
Hemoglobin: 12.9 g/dL (ref 12.0–15.0)
MCV: 83.9 fl (ref 78.0–100.0)
Monocytes Absolute: 1.1 10*3/uL — ABNORMAL HIGH (ref 0.1–1.0)
Neutro Abs: 6 10*3/uL (ref 1.4–7.7)
Neutrophils Relative %: 50.5 % (ref 43.0–77.0)
RBC: 4.57 Mil/uL (ref 3.87–5.11)
WBC: 12 10*3/uL — ABNORMAL HIGH (ref 4.5–10.5)

## 2013-05-10 LAB — PROTIME-INR: INR: 1 ratio (ref 0.8–1.0)

## 2013-05-10 NOTE — Assessment & Plan Note (Signed)
Symptoms somewhat atypical and chronic. However nuclear study is abnormal. Plan cardiac catheterization for definitive evaluation. The risks and benefits were discussed and she agrees to proceed. Hold Glucophage the morning of her procedure in for 48 hours afterwards. Hold Lasix prior to procedure.

## 2013-05-10 NOTE — Assessment & Plan Note (Signed)
Continue statin. 

## 2013-05-10 NOTE — Patient Instructions (Addendum)
Your physician has requested that you have a cardiac catheterization. Cardiac catheterization is used to diagnose and/or treat various heart conditions. Doctors may recommend this procedure for a number of different reasons. The most common reason is to evaluate chest pain. Chest pain can be a symptom of coronary artery disease (CAD), and cardiac catheterization can show whether plaque is narrowing or blocking your heart's arteries. This procedure is also used to evaluate the valves, as well as measure the blood flow and oxygen levels in different parts of your heart. For further information please visit [email or web address] Please follow instruction sheet, as given.   Your physician recommends that you HAVE LAB WORK TODAY  A chest x-ray takes a picture of the organs and structures inside the chest, including the heart, lungs, and blood vessels. This test can show several things, including, whether the heart is enlarges; whether fluid is building up in the lungs; and whether pacemaker / defibrillator leads are still in place. AT [address] OFFICE

## 2013-05-10 NOTE — Assessment & Plan Note (Signed)
Blood pressure controlled. Continue present medications. 

## 2013-05-10 NOTE — Progress Notes (Signed)
 HPI: Pleasant female for fu of CAD and CP. Patient had an anterior myocardial infarction in 1984 that was treated with thrombolytics and angioplasty. Last echocardiogram in 2008 showed normal function, distal septal hypokinesis, impaired LV relaxation, mild tricuspid regurgitation. Cardiac catheterization in April of 2009 showed a normal left main, a 30-40% mid LAD. There was a 50% lesion in a small obtuse marginal. The right coronary was normal. The ejection fraction was 50%. There was mild anterior wall hypokinesis. Abdominal aortogram showed normal renal arteries. There was no iliac disease. I saw her recently and she was complaining of chest pain. Nuclear study was repeated in may of 2014 and showed a small anteroapical infarct with moderate peri-infarct ischemia. Ejection fraction was 59%. Since that time, she denies dyspnea or chest pain.   Current Outpatient Prescriptions  Medication Sig Dispense Refill  . acetaminophen (TYLENOL) 500 MG tablet Take 1,000 mg by mouth every 6 (six) hours as needed. Pain       . ALPRAZolam (XANAX) 0.5 MG tablet Take 1 mg by mouth. ONE TABLET IN AM, TWO TABLETS IN PM AND PRN X1 DURING THE DAY      . aspirin 81 MG tablet Take 81 mg by mouth every morning.       . atorvastatin (LIPITOR) 40 MG tablet TAKE 1 TABLET DAILY  30 tablet  11  . diltiazem (CARDIZEM CD) 120 MG 24 hr capsule TAKE ONE CAPSULE EVERY DAY  30 capsule  11  . Ergocalciferol (VITAMIN D2 PO) Take one tablet by mouth daily      . ezetimibe (ZETIA) 10 MG tablet Take 1 tablet (10 mg total) by mouth daily.  30 tablet  3  . FLUoxetine (PROZAC) 20 MG capsule Take 20 mg by mouth daily with breakfast.       . furosemide (LASIX) 20 MG tablet TAKE 1 TABLET BY MOUTH AS NEEDED FOR SWELLING  30 tablet  0  . ibuprofen (ADVIL,MOTRIN) 200 MG tablet Take 200 mg by mouth every 6 (six) hours as needed. Pain       . isometheptene-acetaminophen-dichloralphenazone (MIDRIN) 65-325-100 MG capsule Take 1 capsule by mouth  4 (four) times daily as needed. Headache       . levothyroxine (SYNTHROID, LEVOTHROID) 150 MCG tablet Take 150 mcg by mouth every morning.       . losartan (COZAAR) 25 MG tablet TAKE 1 TABLET BY MOUTH EVERY DAY  30 tablet  11  . metFORMIN (GLUCOPHAGE) 1000 MG tablet Take 1,000 mg by mouth 2 (two) times daily.       . metoprolol succinate (TOPROL-XL) 25 MG 24 hr tablet TAKE 1 TABLET DAILY  30 tablet  11  . NITROSTAT 0.4 MG SL tablet PLACE 1 TABLET UNDER TONGUE IF NEEDED FOR CHEST PAIN  25 tablet  PRN  . omeprazole (PRILOSEC) 20 MG capsule Take 20 mg by mouth daily as needed. Heart burn       . Polyethyl Glycol-Propyl Glycol (SYSTANE) 0.4-0.3 % SOLN Place 1 drop into both eyes 2 (two) times daily as needed. Dry eyes      . Tamsulosin HCl (FLOMAX) 0.4 MG CAPS Take one capsule by mouth daily after supper as needed       No current facility-administered medications for this visit.     Past Medical History  Diagnosis Date  . Ischemic heart disease   . Diabetes mellitus   . Obesity   . Chronic anxiety   . Nephrolithiasis   . Lumbar herniated disc   .   Depression   . Situational stress   . Hyperlipemia   . History of bronchitis   . History of pneumonia   . Hypothyroidism   . Coronary artery disease CARDIOLOGIST- DR TENNENT-- VISIT 04-01-2011 W/ CHART AND IN EPIC  . History of kidney stones S/P URETEROSCOPIC STONE EXTRACTIONS  . Right ureteral calculus   . 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  NO ISCHEMIA. SUSPECT THIS REPRESENTS PRIOR MI BUT PATTERN UNUSUAL GIVEN WORSE DEFECT AT REST. APICAL HYPODINESIS BUT OVERALL PRESERVED EF.  . History of acute anterior wall MI MARCH 1984    S/P ANGIOPLASTY AND THROMBOLYTICS  . History of hypercalcemia   . History of echocardiogram 05-20-2007    NORMAL LVSF. DISTAL SEPTAL HYPOKINESIA. MILD LEVH W/ IMPAIRED LV RELAXATION. MILD AORTIC SCLEROSIS.  MILD TRICUSPID REGURGITATION  . Arthritis   . GERD (gastroesophageal reflux disease)   . PONV (postoperative nausea and vomiting)   . Panic attack as reaction to stress   . Female bladder prolapse   . Urge urinary incontinence   . Myocardial infarction     1984 MI with angioplasty    Past Surgical History  Procedure Laterality Date  . Left ureteroscopic laser litho/ stone extraction  12-16-2009  &  11-17-2007  . Right ureteroscopic stone extraction   07-04-2008  . Coronary angioplasty  1984  . Cardiac catheterization  04-18-2008; 05-08-2004;  11-29-1986; 08-19-1988    2009 REPORT---- MILD ANTERIOR HYPOKINESIS (FROM REMOTE MI), MILD CORONARY ATHEROSCLEROSIS W/ 30-40% NARROWING IN THE MID LAD & 50% NARROWING IN THE SMALL OBTUSE MARGINAL,  NORMAL RENAL ARTERIES W/ NO ABD. AORTIC DISEASE  . Carpal tunnel release  2005    RIGHT  . Extracorporeal shock wave lithotripsy  2007    X2  . Facial cosmetic surgery    . Ureteroscopy  01/25/2012    Procedure: URETEROSCOPY;  Surgeon: Mark C Ottelin, MD;  Location: [facility];  Service: Urology;  Laterality: Right;    History   Social History  . Marital Status: Married    Spouse Name: N/A    Number of Children: N/A  . Years of Education: N/A   Occupational History  . Not on file.   Social History Main Topics  . Smoking status: Former Smoker    Types: Cigarettes    Quit date: 12/28/1982  . Smokeless tobacco: Never Used  . Alcohol Use: Yes     Comment: socially  . Drug Use: Not on file  . Sexually Active: Not on file   Other Topics Concern  . Not on file   Social History Narrative  . No narrative on file    ROS: no fevers or chills, productive cough, hemoptysis, dysphasia, odynophagia, melena, hematochezia, dysuria, hematuria, rash, seizure activity, orthopnea, PND, pedal edema, claudication. Remaining systems are negative.  Physical Exam: Well-developed well-nourished in no acute distress.  Skin is warm and dry.   HEENT is normal.  Neck is supple.  Chest is clear to auscultation with normal expansion.  Cardiovascular exam is regular rate and rhythm.  Abdominal exam nontender or distended. No masses palpated. Extremities show no edema. neuro grossly intact      

## 2013-05-10 NOTE — Assessment & Plan Note (Signed)
Continue aspirin and statin. 

## 2013-05-11 ENCOUNTER — Inpatient Hospital Stay (HOSPITAL_BASED_OUTPATIENT_CLINIC_OR_DEPARTMENT_OTHER)
Admission: RE | Admit: 2013-05-11 | Discharge: 2013-05-11 | Disposition: A | Discharge status: Home / Self Care | Payer: Medicare Other | Source: Ambulatory Visit | Attending: Cardiology | Admitting: Cardiology

## 2013-05-11 ENCOUNTER — Encounter (HOSPITAL_BASED_OUTPATIENT_CLINIC_OR_DEPARTMENT_OTHER)
Admission: RE | Disposition: A | Discharge status: Home / Self Care | Payer: Self-pay | Source: Ambulatory Visit | Attending: Cardiology

## 2013-05-11 DIAGNOSIS — I079 Rheumatic tricuspid valve disease, unspecified: Secondary | ICD-10-CM | POA: Insufficient documentation

## 2013-05-11 DIAGNOSIS — Z79899 Other long term (current) drug therapy: Secondary | ICD-10-CM | POA: Insufficient documentation

## 2013-05-11 DIAGNOSIS — I2510 Atherosclerotic heart disease of native coronary artery without angina pectoris: Secondary | ICD-10-CM | POA: Insufficient documentation

## 2013-05-11 DIAGNOSIS — R9439 Abnormal result of other cardiovascular function study: Secondary | ICD-10-CM | POA: Insufficient documentation

## 2013-05-11 DIAGNOSIS — E119 Type 2 diabetes mellitus without complications: Secondary | ICD-10-CM | POA: Insufficient documentation

## 2013-05-11 DIAGNOSIS — Z9861 Coronary angioplasty status: Secondary | ICD-10-CM | POA: Insufficient documentation

## 2013-05-11 DIAGNOSIS — R079 Chest pain, unspecified: Secondary | ICD-10-CM | POA: Insufficient documentation

## 2013-05-11 DIAGNOSIS — I252 Old myocardial infarction: Secondary | ICD-10-CM | POA: Insufficient documentation

## 2013-05-11 SURGERY — JV LEFT HEART CATHETERIZATION WITH CORONARY ANGIOGRAM
Anesthesia: Moderate Sedation

## 2013-05-11 MED ORDER — SODIUM CHLORIDE 0.9 % IV SOLN: INTRAVENOUS | Status: AC

## 2013-05-11 MED ORDER — ACETAMINOPHEN 325 MG PO TABS: 650.0000 mg | ORAL_TABLET | ORAL | Status: DC | PRN

## 2013-05-11 MED ORDER — ONDANSETRON HCL 4 MG/2ML IJ SOLN: 4.0000 mg | Freq: Four times a day (QID) | INTRAMUSCULAR | Status: DC | PRN

## 2013-05-11 NOTE — OR Nursing (Signed)
Head elevated to eat and express some discomfort in bottom and rt. wrist area from previous fall.

## 2013-05-11 NOTE — H&P (View-Only) (Signed)
HPI: Pleasant female for fu of CAD and CP. Patient had an anterior myocardial infarction in 1984 that was treated with thrombolytics and angioplasty. Last echocardiogram in 2008 showed normal function, distal septal hypokinesis, impaired LV relaxation, mild tricuspid regurgitation. Cardiac catheterization in April of 2009 showed a normal left main, a 30-40% mid LAD. There was a 50% lesion in a small obtuse marginal. The right coronary was normal. The ejection fraction was 50%. There was mild anterior wall hypokinesis. Abdominal aortogram showed normal renal arteries. There was no iliac disease. I saw her recently and she was complaining of chest pain. Nuclear study was repeated in may of 2014 and showed a small anteroapical infarct with moderate peri-infarct ischemia. Ejection fraction was 59%. Since that time, she denies dyspnea or chest pain.   Current Outpatient Prescriptions  Medication Sig Dispense Refill  . acetaminophen (TYLENOL) 500 MG tablet Take 1,000 mg by mouth every 6 (six) hours as needed. Pain       . ALPRAZolam (XANAX) 0.5 MG tablet Take 1 mg by mouth. ONE TABLET IN AM, TWO TABLETS IN PM AND PRN X1 DURING THE DAY      . aspirin 81 MG tablet Take 81 mg by mouth every morning.       Marland Kitchen atorvastatin (LIPITOR) 40 MG tablet TAKE 1 TABLET DAILY  30 tablet  11  . diltiazem (CARDIZEM CD) 120 MG 24 hr capsule TAKE ONE CAPSULE EVERY DAY  30 capsule  11  . Ergocalciferol (VITAMIN D2 PO) Take one tablet by mouth daily      . ezetimibe (ZETIA) 10 MG tablet Take 1 tablet (10 mg total) by mouth daily.  30 tablet  3  . FLUoxetine (PROZAC) 20 MG capsule Take 20 mg by mouth daily with breakfast.       . furosemide (LASIX) 20 MG tablet TAKE 1 TABLET BY MOUTH AS NEEDED FOR SWELLING  30 tablet  0  . ibuprofen (ADVIL,MOTRIN) 200 MG tablet Take 200 mg by mouth every 6 (six) hours as needed. Pain       . isometheptene-acetaminophen-dichloralphenazone (MIDRIN) 65-325-100 MG capsule Take 1 capsule by mouth  4 (four) times daily as needed. Headache       . levothyroxine (SYNTHROID, LEVOTHROID) 150 MCG tablet Take 150 mcg by mouth every morning.       Marland Kitchen losartan (COZAAR) 25 MG tablet TAKE 1 TABLET BY MOUTH EVERY DAY  30 tablet  11  . metFORMIN (GLUCOPHAGE) 1000 MG tablet Take 1,000 mg by mouth 2 (two) times daily.       . metoprolol succinate (TOPROL-XL) 25 MG 24 hr tablet TAKE 1 TABLET DAILY  30 tablet  11  . NITROSTAT 0.4 MG SL tablet PLACE 1 TABLET UNDER TONGUE IF NEEDED FOR CHEST PAIN  25 tablet  PRN  . omeprazole (PRILOSEC) 20 MG capsule Take 20 mg by mouth daily as needed. Heart burn       . Polyethyl Glycol-Propyl Glycol (SYSTANE) 0.4-0.3 % SOLN Place 1 drop into both eyes 2 (two) times daily as needed. Dry eyes      . Tamsulosin HCl (FLOMAX) 0.4 MG CAPS Take one capsule by mouth daily after supper as needed       No current facility-administered medications for this visit.     Past Medical History  Diagnosis Date  . Ischemic heart disease   . Diabetes mellitus   . Obesity   . Chronic anxiety   . Nephrolithiasis   . Lumbar herniated disc   .  Depression   . Situational stress   . Hyperlipemia   . History of bronchitis   . History of pneumonia   . Hypothyroidism   . Coronary artery disease CARDIOLOGIST- DR Maylon Cos-- VISIT 04-01-2011 W/ CHART AND IN EPIC  . History of kidney stones S/P URETEROSCOPIC STONE EXTRACTIONS  . Right ureteral calculus   . 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  NO ISCHEMIA. SUSPECT THIS REPRESENTS PRIOR MI BUT PATTERN UNUSUAL GIVEN WORSE DEFECT AT REST. APICAL HYPODINESIS BUT OVERALL PRESERVED EF.  Marland Kitchen History of acute anterior wall MI MARCH 1984    S/P ANGIOPLASTY AND THROMBOLYTICS  . History of hypercalcemia   . History of echocardiogram 05-20-2007    NORMAL LVSF. DISTAL SEPTAL HYPOKINESIA. MILD LEVH W/ IMPAIRED LV RELAXATION. MILD AORTIC SCLEROSIS.  MILD TRICUSPID REGURGITATION  . Arthritis   . GERD (gastroesophageal reflux disease)   . PONV (postoperative nausea and vomiting)   . Panic attack as reaction to stress   . Female bladder prolapse   . Urge urinary incontinence   . Myocardial infarction     1984 MI with angioplasty    Past Surgical History  Procedure Laterality Date  . Left ureteroscopic laser litho/ stone extraction  12-16-2009  &  11-17-2007  . Right ureteroscopic stone extraction   07-04-2008  . Coronary angioplasty  1984  . Cardiac catheterization  04-18-2008; 05-08-2004;  11-29-1986; 08-19-1988    2009 REPORT---- MILD ANTERIOR HYPOKINESIS (FROM REMOTE MI), MILD CORONARY ATHEROSCLEROSIS W/ 30-40% NARROWING IN THE MID LAD & 50% NARROWING IN THE SMALL OBTUSE MARGINAL,  NORMAL RENAL ARTERIES W/ NO ABD. AORTIC DISEASE  . Carpal tunnel release  2005    RIGHT  . Extracorporeal shock wave lithotripsy  2007    X2  . Facial cosmetic surgery    . Ureteroscopy  01/25/2012    Procedure: URETEROSCOPY;  Surgeon: Garnett Farm, MD;  Location: [facility];  Service: Urology;  Laterality: Right;    History   Social History  . Marital Status: Married    Spouse Name: N/A    Number of Children: N/A  . Years of Education: N/A   Occupational History  . Not on file.   Social History Main Topics  . Smoking status: Former Smoker    Types: Cigarettes    Quit date: 12/28/1982  . Smokeless tobacco: Never Used  . Alcohol Use: Yes     Comment: socially  . Drug Use: Not on file  . Sexually Active: Not on file   Other Topics Concern  . Not on file   Social History Narrative  . No narrative on file    ROS: no fevers or chills, productive cough, hemoptysis, dysphasia, odynophagia, melena, hematochezia, dysuria, hematuria, rash, seizure activity, orthopnea, PND, pedal edema, claudication. Remaining systems are negative.  Physical Exam: Well-developed well-nourished in no acute distress.  Skin is warm and dry.   HEENT is normal.  Neck is supple.  Chest is clear to auscultation with normal expansion.  Cardiovascular exam is regular rate and rhythm.  Abdominal exam nontender or distended. No masses palpated. Extremities show no edema. neuro grossly intact

## 2013-05-11 NOTE — OR Nursing (Signed)
2 IV attempts 22g lt. Wrist and 24g lt. Forearm.

## 2013-05-11 NOTE — OR Nursing (Signed)
Rt. Groin unremarkable, lt hand IV removed and dressing applied to area. Patient will transported to car via w/c

## 2013-05-11 NOTE — OR Nursing (Signed)
Discharge instruction were reviewed with patient and patient will ambulate with assistance.

## 2013-05-11 NOTE — OR Nursing (Signed)
Bedrest start at 1235

## 2013-05-11 NOTE — CV Procedure (Signed)
   Cardiac Catheterization Procedure Note  Name: [name] [name] MRN: [id number] DOB: [birth year]  Procedure: Left Heart Cath, Selective Coronary Angiography, LV angiography  Indication: Chest pain, history of CAD, abnormal nuclear stress test.    Procedural details: The right groin was prepped, draped, and anesthetized with 1% lidocaine. Using modified Seldinger technique, a 4 French sheath was introduced into the right femoral artery. Standard Judkins catheters were used for coronary angiography and left ventriculography. Catheter exchanges were performed over a guidewire. There were no immediate procedural complications. The patient was transferred to the post catheterization recovery area for further monitoring.  Procedural Findings: Hemodynamics:  AO 127/57 LV 141/17   Coronary angiography: Coronary dominance: left  Left mainstem: No significant disease.   Left anterior descending (LAD): The proximal to mid LAD is heavily calcified. 30% proximal and 30% mid LAD stenoses.   Left circumflex (LCx): Luminal irregularities.  Dominant vessel.   Right coronary artery (RCA): Small to moderate vessel, nondominant.   Left ventriculography: Overall left ventricular systolic function is normal, LVEF is estimated at 55-60%, there is no significant mitral regurgitation.  There was apical inferior hypokinesis.   Final Conclusions:  Heavily calcified LAD but no flow limiting stenosis.  Other vessels with luminal irregularities.  Overall LV systolic function is normal.  Suspect nuclear stress finding was due to prior anterior MI (treated with angioplasty in the 1980s).   Recommendations: Continue cardiac regimen for secondary prevention.   [name] 05/11/2013, 12:23 PM

## 2013-05-11 NOTE — Interval H&P Note (Signed)
History and Physical Interval Note:  05/11/2013 11:50 AM  [name] [name]  has presented today for surgery, with the diagnosis of abnormal stress  The various methods of treatment have been discussed with the patient and family. After consideration of risks, benefits and other options for treatment, the patient has consented to  Procedure(s): JV LEFT HEART CATHETERIZATION WITH CORONARY ANGIOGRAM (N/A) as a surgical intervention .  The patient's history has been reviewed, patient examined, no change in status, stable for surgery.  I have reviewed the patient's chart and labs.  Questions were answered to the patient's satisfaction.     Maria Coin [employer]

## 2013-06-03 ENCOUNTER — Other Ambulatory Visit: Payer: Self-pay | Admitting: Cardiology

## 2013-06-06 ENCOUNTER — Encounter: Payer: Self-pay | Admitting: Physician Assistant

## 2013-06-06 ENCOUNTER — Ambulatory Visit (INDEPENDENT_AMBULATORY_CARE_PROVIDER_SITE_OTHER): Payer: Medicare Other | Admitting: Physician Assistant

## 2013-06-06 VITALS — BP 126/70 | HR 62 | Ht 69.0 in | Wt 199.8 lb

## 2013-06-06 DIAGNOSIS — I2510 Atherosclerotic heart disease of native coronary artery without angina pectoris: Secondary | ICD-10-CM

## 2013-06-06 DIAGNOSIS — M25511 Pain in right shoulder: Secondary | ICD-10-CM

## 2013-06-06 DIAGNOSIS — M25519 Pain in unspecified shoulder: Secondary | ICD-10-CM

## 2013-06-06 DIAGNOSIS — I10 Essential (primary) hypertension: Secondary | ICD-10-CM

## 2013-06-06 DIAGNOSIS — E785 Hyperlipidemia, unspecified: Secondary | ICD-10-CM

## 2013-06-06 NOTE — Patient Instructions (Addendum)
Your physician wants you to follow-up in: 12 MONTH WITH DR. CRENSHAW. You will receive a reminder letter in the mail two months in advance. If you don't receive a letter, please call our office to schedule the follow-up appointment.   NO CHANGES WERE MADE TODAY

## 2013-06-06 NOTE — Progress Notes (Signed)
[address] [place], [place]  [address] Phone: [phone] Fax:  [phone]  Date:  06/06/2013   ID:  [name] [name], [name] [name] 1946/07/18, MRN [id number]  PCP:  [name] Baars, MD  Cardiologist:  Dr. Olga Millers     History of Present Illness: [name] [name] is a 67 y.o. female who returns for f/u after recent cardiac cath.  She has a hx of CAD, s/p anterior MI in 1984 treated with thrombolytics and POBA. Last echo in 5/08: Normal LV function, distal septal HK, impaired LV relaxation, mild TR. LHC 4/09: Normal left main, mid LAD 30-40%, small OM 50%, RCA normal, EF 50%. There was mild anterior wall HK. Abdominal aortogram with normal renal arteries. Myoview 4/12: EF 63%, no ischemia but possible prior anterior infarct. Myoview 5/13: Low risk, anteroapical scar, no ischemia, EF 61%. She was in [place] in late Feb and fell in a pot hole and suffered a tib/fib fracture on the left.  She was seen recently by Dr. Jens Som complaints of chest discomfort. Myoview 05/03/13: Small anteroapical infarct with moderate peri-infarct ischemia, EF 59%. She therefore was set up for a cardiac catheterization. LHC 05/11/13: Proximal LAD 30%, mid LAD 30%, EF 55-60%, apical inferior HK. Overall, her LAD was heavily calcified but she had no flow limiting stenosis. Abnormal nuclear study was felt to be related to prior anterior MI. Continued medical therapy recommended.  The patient denies chest pain, shortness of breath, syncope, orthopnea, PND or significant pedal edema. She notes occasional right shoulder pain with movement since her fall in 01/2013.    Labs (2/13): K 3.8, Cr 1.32, Hgb 11.7  Wt Readings from Last 3 Encounters:  06/06/13 199 lb 12.8 oz (90.629 kg)  05/11/13 202 lb (91.627 kg)  05/11/13 202 lb (91.627 kg)     Past Medical History  Diagnosis Date  . Ischemic heart disease   . Diabetes mellitus   . Obesity   . Chronic anxiety   . Nephrolithiasis   . Lumbar herniated  disc   . Depression   . Situational stress   . Hyperlipemia   . History of bronchitis   . History of pneumonia   . Hypothyroidism   . Coronary artery disease CARDIOLOGIST- DR Maylon Cos-- VISIT 04-01-2011 W/ CHART AND IN EPIC  . History of kidney stones S/P URETEROSCOPIC STONE EXTRACTIONS  . Right ureteral calculus   . 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  NO ISCHEMIA. SUSPECT THIS REPRESENTS PRIOR MI BUT PATTERN UNUSUAL GIVEN WORSE DEFECT AT REST. APICAL HYPODINESIS BUT OVERALL PRESERVED EF.  Marland Kitchen History of acute anterior wall MI MARCH 1984    S/P ANGIOPLASTY AND THROMBOLYTICS  . History of hypercalcemia   . History of echocardiogram 05-20-2007    NORMAL LVSF. DISTAL SEPTAL HYPOKINESIA. MILD LEVH W/ IMPAIRED LV RELAXATION. MILD AORTIC SCLEROSIS. MILD TRICUSPID REGURGITATION  . Arthritis   . GERD (gastroesophageal reflux disease)   . PONV (postoperative nausea and vomiting)   . Panic attack as reaction to stress   . Female bladder prolapse   . Urge urinary incontinence   . Myocardial infarction     1984 MI with angioplasty    Current Outpatient Prescriptions  Medication Sig Dispense Refill  . acetaminophen (TYLENOL) 500 MG tablet Take 1,000 mg by mouth every 6 (six) hours as needed. Pain       . ALPRAZolam (XANAX) 0.5 MG  tablet Take 1 mg by mouth. ONE TABLET IN AM, TWO TABLETS IN PM AND PRN X1 DURING THE DAY      . aspirin 81 MG tablet Take 81 mg by mouth every morning.       Marland Kitchen atorvastatin (LIPITOR) 40 MG tablet TAKE 1 TABLET DAILY  30 tablet  11  . diltiazem (CARDIZEM CD) 120 MG 24 hr capsule TAKE ONE CAPSULE EVERY DAY  30 capsule  11  . Ergocalciferol (VITAMIN D2 PO) Take one tablet by mouth daily      . ezetimibe (ZETIA) 10 MG tablet Take 1 tablet (10 mg total) by mouth daily.  30 tablet  3  . FLUoxetine (PROZAC) 20 MG capsule Take 20 mg by mouth daily with breakfast.       .  furosemide (LASIX) 20 MG tablet TAKE 1 TABLET BY MOUTH AS NEEDED FOR SWELLING  30 tablet  0  . ibuprofen (ADVIL,MOTRIN) 200 MG tablet Take 200 mg by mouth every 6 (six) hours as needed. Pain       . isometheptene-acetaminophen-dichloralphenazone (MIDRIN) 65-325-100 MG capsule Take 1 capsule by mouth 4 (four) times daily as needed. Headache       . levothyroxine (SYNTHROID, LEVOTHROID) 150 MCG tablet Take 150 mcg by mouth every morning.       Marland Kitchen losartan (COZAAR) 25 MG tablet TAKE 1 TABLET BY MOUTH EVERY DAY  30 tablet  11  . metFORMIN (GLUCOPHAGE) 1000 MG tablet Take 1,000 mg by mouth 2 (two) times daily.       . metoprolol succinate (TOPROL-XL) 25 MG 24 hr tablet TAKE 1 TABLET DAILY  30 tablet  11  . NITROSTAT 0.4 MG SL tablet PLACE 1 TABLET UNDER TONGUE IF NEEDED FOR CHEST PAIN  25 tablet  PRN  . omeprazole (PRILOSEC) 20 MG capsule Take 20 mg by mouth daily as needed. Heart burn       . Polyethyl Glycol-Propyl Glycol (SYSTANE) 0.4-0.3 % SOLN Place 1 drop into both eyes 2 (two) times daily as needed. Dry eyes      . Tamsulosin HCl (FLOMAX) 0.4 MG CAPS Take one capsule by mouth daily after supper as needed       No current facility-administered medications for this visit.    Allergies:    Allergies  Allergen Reactions  . Cephalexin Hives    Social History:  The patient  reports that she quit smoking about 30 years ago. Her smoking use included Cigarettes. She smoked 0.00 packs per day. She has never used smokeless tobacco. She reports that  drinks alcohol.   ROS:  Please see the history of present illness.      All other systems reviewed and negative.   PHYSICAL EXAM: VS:  BP 126/70  Pulse 62  Ht 5\' 9"  (1.753 m)  Wt 199 lb 12.8 oz (90.629 kg)  BMI 29.49 kg/m2 Well nourished, well developed, in no acute distress HEENT: normal Neck: no JVD Cardiac:  normal S1, S2; RRR; no murmur Lungs:  clear to auscultation bilaterally, no wheezing, rhonchi or rales Abd: soft, nontender, no  hepatomegaly Ext: no edema; right groin without hematoma or bruit  Skin: warm and dry Neuro:  CNs 2-12 intact, no focal abnormalities noted  EKG:  NSR, HR 62, normal axis, no acute changes     ASSESSMENT AND PLAN:  1. CAD: Stable anatomy by recent cardiac catheterization. Continue current medical therapy with aspirin, statin, beta blocker. 2. Shoulder Pain: Shoulder pain is likely related to musculoskeletal  injury after a fall in February. Continue followup with orthopedics. 3. Hypertension: Controlled.  Continue current therapy. 4. Hyperlipidemia: Controlled. Continue current therapy. 5. Disposition: Follow up with Dr. Jens Som in 1 year.  Signed, Tereso Newcomer, PA-C  06/06/2013 11:19 AM

## 2013-06-24 ENCOUNTER — Other Ambulatory Visit: Payer: Self-pay | Admitting: Cardiology

## 2013-07-28 ENCOUNTER — Other Ambulatory Visit: Payer: Self-pay | Admitting: Cardiology

## 2013-08-08 ENCOUNTER — Other Ambulatory Visit (HOSPITAL_COMMUNITY): Payer: Self-pay | Admitting: *Deleted

## 2013-08-09 ENCOUNTER — Ambulatory Visit (HOSPITAL_COMMUNITY)
Admission: RE | Admit: 2013-08-09 | Discharge: 2013-08-09 | Disposition: A | Discharge status: Home / Self Care | Payer: Medicare Other | Source: Ambulatory Visit | Attending: Internal Medicine | Admitting: Internal Medicine

## 2013-08-09 DIAGNOSIS — M810 Age-related osteoporosis without current pathological fracture: Secondary | ICD-10-CM | POA: Insufficient documentation

## 2013-08-09 MED ORDER — ZOLEDRONIC ACID 5 MG/100ML IV SOLN
5.0000 mg | Freq: Once | INTRAVENOUS | Status: AC
  Administered 2013-08-09: 5 mg via INTRAVENOUS

## 2013-08-09 MED ORDER — ZOLEDRONIC ACID 5 MG/100ML IV SOLN
INTRAVENOUS | Status: AC
  Filled 2013-08-09: qty 100

## 2013-11-29 ENCOUNTER — Other Ambulatory Visit: Payer: Self-pay | Admitting: Cardiology

## 2013-12-26 ENCOUNTER — Encounter: Payer: Self-pay | Admitting: Internal Medicine

## 2014-01-16 ENCOUNTER — Encounter: Payer: Self-pay | Admitting: Cardiology

## 2014-01-23 ENCOUNTER — Other Ambulatory Visit: Payer: Self-pay

## 2014-01-23 DIAGNOSIS — Z1231 Encounter for screening mammogram for malignant neoplasm of breast: Secondary | ICD-10-CM

## 2014-02-01 ENCOUNTER — Encounter: Payer: Self-pay | Admitting: Cardiology

## 2014-02-01 ENCOUNTER — Ambulatory Visit (HOSPITAL_COMMUNITY): Payer: Medicare Other | Attending: Cardiology

## 2014-02-01 ENCOUNTER — Ambulatory Visit (INDEPENDENT_AMBULATORY_CARE_PROVIDER_SITE_OTHER): Payer: Medicare Other | Admitting: Cardiology

## 2014-02-01 ENCOUNTER — Other Ambulatory Visit (HOSPITAL_COMMUNITY): Payer: Self-pay | Admitting: *Deleted

## 2014-02-01 VITALS — BP 124/78 | HR 60 | Ht 69.0 in | Wt 197.0 lb

## 2014-02-01 DIAGNOSIS — I2510 Atherosclerotic heart disease of native coronary artery without angina pectoris: Secondary | ICD-10-CM | POA: Insufficient documentation

## 2014-02-01 DIAGNOSIS — I10 Essential (primary) hypertension: Secondary | ICD-10-CM

## 2014-02-01 DIAGNOSIS — Z87891 Personal history of nicotine dependence: Secondary | ICD-10-CM | POA: Insufficient documentation

## 2014-02-01 DIAGNOSIS — I70219 Atherosclerosis of native arteries of extremities with intermittent claudication, unspecified extremity: Secondary | ICD-10-CM

## 2014-02-01 DIAGNOSIS — E785 Hyperlipidemia, unspecified: Secondary | ICD-10-CM | POA: Insufficient documentation

## 2014-02-01 DIAGNOSIS — I739 Peripheral vascular disease, unspecified: Secondary | ICD-10-CM

## 2014-02-01 DIAGNOSIS — E119 Type 2 diabetes mellitus without complications: Secondary | ICD-10-CM | POA: Insufficient documentation

## 2014-02-01 NOTE — Assessment & Plan Note (Signed)
Blood pressure controlled. Continue present medications. 

## 2014-02-01 NOTE — Assessment & Plan Note (Signed)
Continue statin. Lipids and liver monitored by primary care. 

## 2014-02-01 NOTE — Progress Notes (Signed)
HPI: FU CAD, s/p anterior MI in 1984 treated with thrombolytics and POBA. Last echo in 5/08: Normal LV function, distal septal HK, impaired LV relaxation, mild TR. Myoview 05/03/13: Small anteroapical infarct with moderate peri-infarct ischemia, EF 59%. LHC 05/11/13: Proximal LAD 30%, mid LAD 30%, EF 55-60%, apical inferior HK. Overall, her LAD was heavily calcified but she had no flow limiting stenosis. Abnormal nuclear study was felt to be related to prior anterior MI. Continued medical therapy recommended. Last seen in June 2014; since then, she denies dyspnea on exertion, orthopnea, PND, pedal edema or exertional chest pain. She has had pain in her hips bilaterally with ambulation as well as in her lower extremities. This occurs in both extremities to the same extent. She also has pain in her arms. Recent screening shows mild to moderate bilateral carotid disease. No abdominal aortic aneurysm. ABI on the right 0.65. Left normal.   Current Outpatient Prescriptions  Medication Sig Dispense Refill  . acetaminophen (TYLENOL) 500 MG tablet Take 1,000 mg by mouth every 6 (six) hours as needed. Pain       . ALPRAZolam (XANAX) 0.5 MG tablet Take 1 mg by mouth. ONE TABLET IN AM, TWO TABLETS IN PM AND PRN X1 DURING THE DAY      . aspirin 81 MG tablet Take 81 mg by mouth every morning.       Marland Kitchen atorvastatin (LIPITOR) 40 MG tablet TAKE 1 TABLET DAILY  30 tablet  11  . diltiazem (CARDIZEM CD) 120 MG 24 hr capsule TAKE ONE CAPSULE EVERY DAY  30 capsule  11  . Ergocalciferol (VITAMIN D2 PO) Take one tablet by mouth daily      . FLUoxetine (PROZAC) 20 MG capsule Take 20 mg by mouth daily with breakfast.       . furosemide (LASIX) 20 MG tablet TAKE 1 TABLET BY MOUTH AS NEEDED FOR SWELLING  30 tablet  0  . ibuprofen (ADVIL,MOTRIN) 200 MG tablet Take 200 mg by mouth every 6 (six) hours as needed. Pain       . isometheptene-acetaminophen-dichloralphenazone (MIDRIN) 65-325-100 MG capsule Take 1 capsule by mouth  4 (four) times daily as needed. Headache       . levothyroxine (SYNTHROID, LEVOTHROID) 150 MCG tablet Take 150 mcg by mouth every morning.       Marland Kitchen losartan (COZAAR) 25 MG tablet TAKE 1 TABLET BY MOUTH EVERY DAY  30 tablet  11  . metFORMIN (GLUCOPHAGE) 1000 MG tablet Take 1,000 mg by mouth 2 (two) times daily.       . metoprolol succinate (TOPROL-XL) 25 MG 24 hr tablet TAKE 1 TABLET DAILY  30 tablet  11  . NITROSTAT 0.4 MG SL tablet PLACE 1 TABLET UNDER TONGUE IF NEEDED FOR CHEST PAIN  25 tablet  PRN  . Polyethyl Glycol-Propyl Glycol (SYSTANE) 0.4-0.3 % SOLN Place 1 drop into both eyes 2 (two) times daily as needed. Dry eyes      . ZETIA 10 MG tablet TAKE 1 TABLET BY MOUTH EVERY DAY  30 tablet  6  . omeprazole (PRILOSEC) 20 MG capsule Take 20 mg by mouth daily as needed. Heart burn       . Tamsulosin HCl (FLOMAX) 0.4 MG CAPS Take one capsule by mouth daily after supper as needed       No current facility-administered medications for this visit.     Past Medical History  Diagnosis Date  . Ischemic heart disease   . Diabetes  mellitus   . Obesity   . Chronic anxiety   . Nephrolithiasis   . Lumbar herniated disc   . Depression   . Situational stress   . Hyperlipemia   . History of bronchitis   . History of pneumonia   . Hypothyroidism   . Coronary artery disease CARDIOLOGIST- DR Vidal Schwalbe-- VISIT 04-01-2011 W/ CHART AND IN EPIC  . History of kidney stones S/P URETEROSCOPIC STONE EXTRACTIONS  . Right ureteral calculus   . 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  NO ISCHEMIA. SUSPECT THIS REPRESENTS PRIOR MI BUT PATTERN UNUSUAL GIVEN WORSE DEFECT AT REST. APICAL HYPODINESIS BUT OVERALL PRESERVED EF.  Marland Kitchen History of acute anterior wall MI MARCH 1984    S/P ANGIOPLASTY AND THROMBOLYTICS  . History of hypercalcemia   . History of echocardiogram 05-20-2007    NORMAL LVSF. DISTAL SEPTAL  HYPOKINESIA. MILD LEVH W/ IMPAIRED LV RELAXATION. MILD AORTIC SCLEROSIS. MILD TRICUSPID REGURGITATION  . Arthritis   . GERD (gastroesophageal reflux disease)   . PONV (postoperative nausea and vomiting)   . Panic attack as reaction to stress   . Female bladder prolapse   . Urge urinary incontinence   . Myocardial infarction     1984 MI with angioplasty    Past Surgical History  Procedure Laterality Date  . Left ureteroscopic laser litho/ stone extraction  12-16-2009  &  11-17-2007  . Right ureteroscopic stone extraction   07-04-2008  . Coronary angioplasty  1984  . Cardiac catheterization  04-18-2008; 05-08-2004;  11-29-1986; 08-19-1988    2009 REPORT---- MILD ANTERIOR HYPOKINESIS (FROM REMOTE MI), MILD CORONARY ATHEROSCLEROSIS W/ 30-40% NARROWING IN THE MID LAD & 50% NARROWING IN THE SMALL OBTUSE MARGINAL,  NORMAL RENAL ARTERIES W/ NO ABD. AORTIC DISEASE  . Carpal tunnel release  2005    RIGHT  . Extracorporeal shock wave lithotripsy  2007    X2  . Facial cosmetic surgery    . Ureteroscopy  01/25/2012    Procedure: URETEROSCOPY;  Surgeon: Claybon Jabs, MD;  Location: [facility];  Service: Urology;  Laterality: Right;    History   Social History  . Marital Status: Married    Spouse Name: N/A    Number of Children: N/A  . Years of Education: N/A   Occupational History  . Not on file.   Social History Main Topics  . Smoking status: Former Smoker    Types: Cigarettes    Quit date: 12/28/1982  . Smokeless tobacco: Never Used  . Alcohol Use: Yes     Comment: socially  . Drug Use: Not on file  . Sexual Activity: Not on file   Other Topics Concern  . Not on file   Social History Narrative  . No narrative on file    ROS: Extremely anxious, no fevers or chills, productive cough, hemoptysis, dysphasia, odynophagia, melena, hematochezia, dysuria, hematuria, rash, seizure activity, orthopnea, PND, pedal edema, claudication. Remaining systems are  negative.  Physical Exam: Well-developed obese in no acute distress.  Skin is warm and dry.  HEENT is normal.  Neck is supple.  Chest is clear to auscultation with normal expansion.  Cardiovascular exam is regular rate and rhythm.  Abdominal exam nontender or distended. No masses palpated. Extremities show no edema. 1+ DP on the right and 2+ on the left. neuro grossly intact  ECG sinus rhythm with no ST changes.

## 2014-02-01 NOTE — Patient Instructions (Signed)
Your physician wants you to follow-up in: [place] will receive a reminder letter in the mail two months in advance. If you don't receive a letter, please call our office to schedule the follow-up appointment.   Your physician has requested that you have a lower extremity arterial duplex. During this test,  ultrasound are used to evaluate arterial blood flow in the legs. Allow one hour for this exam. There are no restrictions or special instructions.

## 2014-02-01 NOTE — Assessment & Plan Note (Signed)
Continue aspirin and statin. 

## 2014-02-01 NOTE — Assessment & Plan Note (Signed)
Patient with recent [facility] screening showing reduced ABI on the right. She has pain in both of her legs but has multiple complaints including her upper extremities as well. Schedule ABIs with Doppler. Continue aspirin and statin. Note reduced pulse on the right DP.

## 2014-02-23 ENCOUNTER — Telehealth: Payer: Self-pay | Admitting: Cardiology

## 2014-02-23 NOTE — Telephone Encounter (Signed)
New Message  Pt is requesting a call back to discuss the Consult appt with Dr. Fletcher Anon on 03/03// she is wondering if the fasting lab is needed for this appt//SR

## 2014-02-23 NOTE — Telephone Encounter (Signed)
Spoke with pt, aware she does not be fasting for appt with dr Fletcher Anon.

## 2014-02-27 ENCOUNTER — Encounter: Payer: Self-pay | Admitting: Cardiovascular Disease

## 2014-02-27 ENCOUNTER — Encounter: Payer: Self-pay | Admitting: Cardiology

## 2014-02-27 ENCOUNTER — Ambulatory Visit (INDEPENDENT_AMBULATORY_CARE_PROVIDER_SITE_OTHER): Payer: Medicare Other | Admitting: Cardiovascular Disease

## 2014-02-27 ENCOUNTER — Ambulatory Visit (HOSPITAL_COMMUNITY): Payer: Medicare Other | Attending: Cardiology

## 2014-02-27 VITALS — BP 134/72 | HR 68 | Ht 69.0 in | Wt 198.0 lb

## 2014-02-27 DIAGNOSIS — I700 Atherosclerosis of aorta: Secondary | ICD-10-CM

## 2014-02-27 DIAGNOSIS — E785 Hyperlipidemia, unspecified: Secondary | ICD-10-CM

## 2014-02-27 DIAGNOSIS — I739 Peripheral vascular disease, unspecified: Secondary | ICD-10-CM

## 2014-02-27 DIAGNOSIS — I2510 Atherosclerotic heart disease of native coronary artery without angina pectoris: Secondary | ICD-10-CM

## 2014-02-27 LAB — CBC
HEMATOCRIT: 38.3 % (ref 36.0–46.0)
HEMOGLOBIN: 12.4 g/dL (ref 12.0–15.0)
MCHC: 32.4 g/dL (ref 30.0–36.0)
MCV: 86.3 fl (ref 78.0–100.0)
Platelets: 343 10*3/uL (ref 150.0–400.0)
RBC: 4.44 Mil/uL (ref 3.87–5.11)
RDW: 14.6 % (ref 11.5–14.6)
WBC: 10.4 10*3/uL (ref 4.5–10.5)

## 2014-02-27 LAB — PROTIME-INR
INR: 1.1 ratio — AB (ref 0.8–1.0)
Prothrombin Time: 11.4 s (ref 10.2–12.4)

## 2014-02-27 NOTE — Progress Notes (Signed)
Primary care physician: Dr. Marton Redwood Primary cardiologist: Dr. Stanford Breed   HPI: This is a 68 year old female who was referred by Dr. Stanford Breed for evaluation and management of peripheral arterial disease. She has known history of coronary artery disease  s/p anterior MI in 1984 treated with thrombolytics and POBA. Last echo in 5/08: Normal LV function, distal septal HK, impaired LV relaxation, mild TR. Myoview 05/03/13: Small anteroapical infarct with moderate peri-infarct ischemia, EF 59%. LHC 05/11/13: Proximal LAD 30%, mid LAD 30%, EF 55-60%, apical inferior HK. Overall, her LAD was heavily calcified but she had no flow limiting stenosis. Abnormal nuclear study was felt to be related to prior anterior MI.  She reports progressive buttock and thigh claudication which started last summer but became very noticeable when she attempted to start an exercise program. The discomfort is usually and happens at short distance of walking. The symptoms or mostly on the right side. 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 She is not a smoker. She had cardiovascular  screening testing in November which showed mild to moderate bilateral carotid disease. No abdominal aortic aneurysm. ABI on the right 0.65. Left normal. lower extremity arterial duplex showed no significant infrainguinal disease but there was monophasic waveform in the right common femoral artery and below suggestive of significant inflow disease.    Current Outpatient Prescriptions  Medication Sig Dispense Refill  . acetaminophen (TYLENOL) 500 MG tablet Take 1,000 mg by mouth every 6 (six) hours as needed. Pain       . ALPRAZolam (XANAX) 0.5 MG tablet Take 1 mg by mouth. ONE TABLET IN AM, TWO TABLETS IN PM AND PRN X1 DURING THE DAY      . aspirin 81 MG tablet Take 81 mg by mouth every morning.       Marland Kitchen atorvastatin (LIPITOR) 40 MG tablet TAKE 1 TABLET DAILY  30 tablet  11  . diltiazem (CARDIZEM CD) 120  MG 24 hr capsule TAKE ONE CAPSULE EVERY DAY  30 capsule  11  . Ergocalciferol (VITAMIN D2 PO) Take one tablet by mouth daily      . FLUoxetine (PROZAC) 20 MG capsule Take 20 mg by mouth daily with breakfast.       . furosemide (LASIX) 20 MG tablet TAKE 1 TABLET BY MOUTH AS NEEDED FOR SWELLING  30 tablet  0  . ibuprofen (ADVIL,MOTRIN) 200 MG tablet Take 200 mg by mouth every 6 (six) hours as needed. Pain       . isometheptene-acetaminophen-dichloralphenazone (MIDRIN) 65-325-100 MG capsule Take 1 capsule by mouth 4 (four) times daily as needed. Headache       . levothyroxine (SYNTHROID, LEVOTHROID) 150 MCG tablet Take 150 mcg by mouth every morning.       Marland Kitchen losartan (COZAAR) 25 MG tablet TAKE 1 TABLET BY MOUTH EVERY DAY  30 tablet  11  . metFORMIN (GLUCOPHAGE) 1000 MG tablet Take 1,000 mg by mouth 2 (two) times daily.       . metoprolol succinate (TOPROL-XL) 25 MG 24 hr tablet TAKE 1 TABLET DAILY  30 tablet  11  . NITROSTAT 0.4 MG SL tablet PLACE 1 TABLET UNDER TONGUE IF NEEDED FOR CHEST PAIN  25 tablet  PRN  . omeprazole (PRILOSEC) 20 MG capsule Take 20 mg by mouth daily as needed. Heart burn       . Polyethyl Glycol-Propyl Glycol (SYSTANE) 0.4-0.3 % SOLN Place 1 drop into both eyes 2 (two) times daily as needed. Dry  eyes      . Tamsulosin HCl (FLOMAX) 0.4 MG CAPS Take one capsule by mouth daily after supper as needed      . ZETIA 10 MG tablet TAKE 1 TABLET BY MOUTH EVERY DAY  30 tablet  6   No current facility-administered medications for this visit.     Past Medical History  Diagnosis Date  . Ischemic heart disease   . Diabetes mellitus   . Obesity   . Chronic anxiety   . Nephrolithiasis   . Lumbar herniated disc   . Depression   . Situational stress   . Hyperlipemia   . History of bronchitis   . History of pneumonia   . Hypothyroidism   . Coronary artery disease CARDIOLOGIST- DR Vidal Schwalbe-- VISIT 04-01-2011 W/ CHART AND IN EPIC  . History of kidney stones S/P URETEROSCOPIC STONE  EXTRACTIONS  . Right ureteral calculus   . 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  NO ISCHEMIA. SUSPECT THIS REPRESENTS PRIOR MI BUT PATTERN UNUSUAL GIVEN WORSE DEFECT AT REST. APICAL HYPODINESIS BUT OVERALL PRESERVED EF.  Marland Kitchen History of acute anterior wall MI MARCH 1984    S/P ANGIOPLASTY AND THROMBOLYTICS  . History of hypercalcemia   . History of echocardiogram 05-20-2007    NORMAL LVSF. DISTAL SEPTAL HYPOKINESIA. MILD LEVH W/ IMPAIRED LV RELAXATION. MILD AORTIC SCLEROSIS. MILD TRICUSPID REGURGITATION  . Arthritis   . GERD (gastroesophageal reflux disease)   . PONV (postoperative nausea and vomiting)   . Panic attack as reaction to stress   . Female bladder prolapse   . Urge urinary incontinence   . Myocardial infarction     1984 MI with angioplasty    Past Surgical History  Procedure Laterality Date  . Left ureteroscopic laser litho/ stone extraction  12-16-2009  &  11-17-2007  . Right ureteroscopic stone extraction   07-04-2008  . Coronary angioplasty  1984  . Cardiac catheterization  04-18-2008; 05-08-2004;  11-29-1986; 08-19-1988    2009 REPORT---- MILD ANTERIOR HYPOKINESIS (FROM REMOTE MI), MILD CORONARY ATHEROSCLEROSIS W/ 30-40% NARROWING IN THE MID LAD & 50% NARROWING IN THE SMALL OBTUSE MARGINAL,  NORMAL RENAL ARTERIES W/ NO ABD. AORTIC DISEASE  . Carpal tunnel release  2005    RIGHT  . Extracorporeal shock wave lithotripsy  2007    X2  . Facial cosmetic surgery    . Ureteroscopy  01/25/2012    Procedure: URETEROSCOPY;  Surgeon: Claybon Jabs, MD;  Location: [facility];  Service: Urology;  Laterality: Right;    History   Social History  . Marital Status: Married    Spouse Name: N/A    Number of Children: N/A  . Years of Education: N/A   Occupational History  . Not on file.   Social History Main Topics  . Smoking status: Former Smoker    Types:  Cigarettes    Quit date: 12/28/1982  . Smokeless tobacco: Never Used  . Alcohol Use: Yes     Comment: socially  . Drug Use: Not on file  . Sexual Activity: Not on file   Other Topics Concern  . Not on file   Social History Narrative  . No narrative on file    ROS: Extremely anxious, no fevers or chills, productive cough, hemoptysis, dysphasia, odynophagia, melena, hematochezia, dysuria, hematuria, rash, seizure activity, orthopnea, PND, pedal edema, claudication. Remaining systems are negative.  Constitutional: He is  oriented to person, place, and time. He appears well-developed and well-nourished. No distress.  HENT: No nasal discharge.  Head: Normocephalic and atraumatic.  Eyes: Pupils are equal and round.  No discharge. Neck: Normal range of motion. Neck supple. No JVD present. No thyromegaly present.  Cardiovascular: Normal rate, regular rhythm, normal heart sounds. Exam reveals no gallop and no friction rub. No murmur heard.  Pulmonary/Chest: Effort normal and breath sounds normal. No stridor. No respiratory distress. He has no wheezes. He has no rales. He exhibits no tenderness.  Abdominal: Soft. Bowel sounds are normal. He exhibits no distension. There is no tenderness. There is no rebound and no guarding.  Musculoskeletal: Normal range of motion. He exhibits no edema and no tenderness.  Neurological: He is alert and oriented to person, place, and time. Coordination normal.  Skin: Skin is warm and dry. No rash noted. He is not diaphoretic. No erythema. No pallor.  Psychiatric: He has a normal mood and affect. His behavior is normal. Judgment and thought content normal.  Vascular: Femoral pulse: Absent on the right side and +1 on the left side. Dorsalis pedis and posterior tibial: +1 on the right side and +2 on the left side.

## 2014-02-27 NOTE — Patient Instructions (Signed)
Your physician has requested that you have an abdominal aorto-iliac duplex this week. During this test, an ultrasound is used to evaluate the aorta and iliacs. Allow 30 minutes for this exam. Do not eat after midnight the day before and avoid carbonated beverages  Your physician has requested that you have a peripheral vascular angiogram. This exam is performed at the hospital. During this exam IV contrast is used to look at arterial blood flow. Please review the information sheet given for details.  Your physician recommends that you return for lab work today for bmet, cbc and pt/inr.

## 2014-02-28 ENCOUNTER — Telehealth: Payer: Self-pay | Admitting: Cardiology

## 2014-02-28 ENCOUNTER — Encounter: Payer: Self-pay | Admitting: Cardiology

## 2014-02-28 ENCOUNTER — Other Ambulatory Visit (INDEPENDENT_AMBULATORY_CARE_PROVIDER_SITE_OTHER): Payer: Medicare Other

## 2014-02-28 DIAGNOSIS — I739 Peripheral vascular disease, unspecified: Secondary | ICD-10-CM

## 2014-02-28 NOTE — Telephone Encounter (Signed)
Left message for pt to call.

## 2014-02-28 NOTE — Telephone Encounter (Signed)
Pt here for lab work. Questions regarding lab work answered.

## 2014-02-28 NOTE — Telephone Encounter (Signed)
New message     Pt has questions about information not on mychart

## 2014-03-01 ENCOUNTER — Encounter: Payer: Self-pay | Admitting: Cardiovascular Disease

## 2014-03-01 ENCOUNTER — Encounter (HOSPITAL_COMMUNITY): Payer: Self-pay

## 2014-03-01 DIAGNOSIS — I739 Peripheral vascular disease, unspecified: Secondary | ICD-10-CM | POA: Insufficient documentation

## 2014-03-01 LAB — BASIC METABOLIC PANEL
BUN: 14 mg/dL (ref 6–23)
CHLORIDE: 105 meq/L (ref 96–112)
CO2: 26 mEq/L (ref 19–32)
Calcium: 10.2 mg/dL (ref 8.4–10.5)
Creatinine, Ser: 1 mg/dL (ref 0.4–1.2)
GFR: 60.5 mL/min (ref >60.00)
Glucose, Bld: 82 mg/dL (ref 70–99)
Potassium: 4.4 mEq/L (ref 3.5–5.1)
Sodium: 139 mEq/L (ref 135–145)

## 2014-03-01 NOTE — Assessment & Plan Note (Signed)
Continue treatment with atorvastatin and Zetia with a target LDL of less than 70

## 2014-03-01 NOTE — Assessment & Plan Note (Signed)
She has no symptoms of angina. Continue medical therapy. 

## 2014-03-01 NOTE — Addendum Note (Signed)
Addended by: Kathlyn Sacramento A on: 03/01/2014 06:43 AM   Modules accepted: Orders

## 2014-03-01 NOTE — Assessment & Plan Note (Signed)
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 This has significantly affected inability to exercise and lose weight. Due to severity of her symptoms, I recommend proceeding with abdominal aortogram, lower extremity runoff and possible angioplasty. Risks, benefits and alternatives were discussed with the patient. I will obtain an aortoiliac duplex before the procedure. I will likely obtain access via the right common femoral artery.

## 2014-03-07 ENCOUNTER — Encounter (HOSPITAL_COMMUNITY)
Admission: RE | Disposition: A | Discharge status: Home / Self Care | Payer: Medicare Other | Source: Ambulatory Visit | Attending: Cardiovascular Disease

## 2014-03-07 ENCOUNTER — Encounter (HOSPITAL_COMMUNITY): Payer: Self-pay | Admitting: General Practice

## 2014-03-07 ENCOUNTER — Ambulatory Visit (HOSPITAL_COMMUNITY)
Admission: RE | Admit: 2014-03-07 | Discharge: 2014-03-08 | Disposition: A | Discharge status: Home / Self Care | Payer: Medicare Other | Source: Ambulatory Visit | Attending: Cardiovascular Disease | Admitting: Cardiovascular Disease

## 2014-03-07 DIAGNOSIS — Z7982 Long term (current) use of aspirin: Secondary | ICD-10-CM | POA: Insufficient documentation

## 2014-03-07 DIAGNOSIS — I252 Old myocardial infarction: Secondary | ICD-10-CM | POA: Insufficient documentation

## 2014-03-07 DIAGNOSIS — I209 Angina pectoris, unspecified: Secondary | ICD-10-CM | POA: Insufficient documentation

## 2014-03-07 DIAGNOSIS — F419 Anxiety disorder, unspecified: Secondary | ICD-10-CM | POA: Diagnosis present

## 2014-03-07 DIAGNOSIS — R079 Chest pain, unspecified: Secondary | ICD-10-CM

## 2014-03-07 DIAGNOSIS — I658 Occlusion and stenosis of other precerebral arteries: Secondary | ICD-10-CM | POA: Insufficient documentation

## 2014-03-07 DIAGNOSIS — I6529 Occlusion and stenosis of unspecified carotid artery: Secondary | ICD-10-CM | POA: Insufficient documentation

## 2014-03-07 DIAGNOSIS — I70219 Atherosclerosis of native arteries of extremities with intermittent claudication, unspecified extremity: Secondary | ICD-10-CM

## 2014-03-07 DIAGNOSIS — E1142 Type 2 diabetes mellitus with diabetic polyneuropathy: Secondary | ICD-10-CM | POA: Diagnosis present

## 2014-03-07 DIAGNOSIS — E1149 Type 2 diabetes mellitus with other diabetic neurological complication: Secondary | ICD-10-CM | POA: Insufficient documentation

## 2014-03-07 DIAGNOSIS — E785 Hyperlipidemia, unspecified: Secondary | ICD-10-CM | POA: Diagnosis present

## 2014-03-07 DIAGNOSIS — I708 Atherosclerosis of other arteries: Secondary | ICD-10-CM | POA: Insufficient documentation

## 2014-03-07 DIAGNOSIS — IMO0001 Reserved for inherently not codable concepts without codable children: Secondary | ICD-10-CM | POA: Insufficient documentation

## 2014-03-07 DIAGNOSIS — I739 Peripheral vascular disease, unspecified: Secondary | ICD-10-CM | POA: Diagnosis present

## 2014-03-07 DIAGNOSIS — F411 Generalized anxiety disorder: Secondary | ICD-10-CM | POA: Insufficient documentation

## 2014-03-07 DIAGNOSIS — I2510 Atherosclerotic heart disease of native coronary artery without angina pectoris: Secondary | ICD-10-CM | POA: Diagnosis present

## 2014-03-07 DIAGNOSIS — Z9861 Coronary angioplasty status: Secondary | ICD-10-CM | POA: Insufficient documentation

## 2014-03-07 HISTORY — DX: Pneumonia, unspecified organism: J18.9

## 2014-03-07 HISTORY — DX: Type 2 diabetes mellitus without complications: E11.9

## 2014-03-07 HISTORY — PX: LOWER EXTREMITY ANGIOGRAM: SHX5508

## 2014-03-07 HISTORY — DX: Chronic obstructive pulmonary disease, unspecified: J44.9

## 2014-03-07 HISTORY — DX: Peripheral vascular disease, unspecified: I73.9

## 2014-03-07 HISTORY — DX: Family history of other specified conditions: Z84.89

## 2014-03-07 HISTORY — DX: Migraine, unspecified, not intractable, without status migrainosus: G43.909

## 2014-03-07 HISTORY — PX: PERCUTANEOUS STENT INTERVENTION: SHX5500

## 2014-03-07 HISTORY — PX: ABDOMINAL ANGIOGRAM: SHX5499

## 2014-03-07 HISTORY — DX: ST elevation (STEMI) myocardial infarction involving other coronary artery of anterior wall: I21.09

## 2014-03-07 HISTORY — DX: Headache: R51

## 2014-03-07 HISTORY — PX: ILIAC ARTERY STENT: SHX1786

## 2014-03-07 HISTORY — DX: Headache, unspecified: R51.9

## 2014-03-07 LAB — CBC
HCT: 35.9 % — ABNORMAL LOW (ref 36.0–46.0)
Hemoglobin: 11.9 g/dL — ABNORMAL LOW (ref 12.0–15.0)
MCH: 28.4 pg (ref 26.0–34.0)
MCHC: 33.1 g/dL (ref 30.0–36.0)
MCV: 85.7 fL (ref 78.0–100.0)
Platelets: 302 10*3/uL (ref 150–400)
RBC: 4.19 MIL/uL (ref 3.87–5.11)
RDW: 14.2 % (ref 11.5–15.5)
WBC: 9.1 10*3/uL (ref 4.0–10.5)

## 2014-03-07 LAB — GLUCOSE, CAPILLARY
GLUCOSE-CAPILLARY: 95 mg/dL (ref 70–99)
Glucose-Capillary: 111 mg/dL — ABNORMAL HIGH (ref 70–99)
Glucose-Capillary: 155 mg/dL — ABNORMAL HIGH (ref 70–99)

## 2014-03-07 LAB — BASIC METABOLIC PANEL
BUN: 20 mg/dL (ref 6–23)
CO2: 26 meq/L (ref 19–32)
Calcium: 10 mg/dL (ref 8.4–10.5)
Chloride: 104 mEq/L (ref 96–112)
Creatinine, Ser: 0.81 mg/dL (ref 0.50–1.10)
GFR calc Af Amer: 84 mL/min — ABNORMAL LOW (ref >90)
GFR, EST NON AFRICAN AMERICAN: 72 mL/min — AB (ref >90)
Glucose, Bld: 122 mg/dL — ABNORMAL HIGH (ref 70–99)
POTASSIUM: 4.4 meq/L (ref 3.7–5.3)
SODIUM: 141 meq/L (ref 137–147)

## 2014-03-07 LAB — PROTIME-INR
INR: 1.01 (ref 0.00–1.49)
PROTHROMBIN TIME: 13.1 s (ref 11.6–15.2)

## 2014-03-07 SURGERY — ABDOMINAL ANGIOGRAM
Anesthesia: LOCAL

## 2014-03-07 MED ORDER — ALBUTEROL SULFATE HFA 108 (90 BASE) MCG/ACT IN AERS: 1.0000 | INHALATION_SPRAY | Freq: Four times a day (QID) | RESPIRATORY_TRACT | Status: DC | PRN

## 2014-03-07 MED ORDER — FLUOXETINE HCL 20 MG PO CAPS
20.0000 mg | ORAL_CAPSULE | Freq: Every day | ORAL | Status: DC
  Administered 2014-03-08: 09:00:00 20 mg via ORAL
  Filled 2014-03-07 (×2): qty 1

## 2014-03-07 MED ORDER — ALBUTEROL SULFATE (2.5 MG/3ML) 0.083% IN NEBU: 2.5000 mg | INHALATION_SOLUTION | Freq: Four times a day (QID) | RESPIRATORY_TRACT | Status: DC | PRN

## 2014-03-07 MED ORDER — SODIUM CHLORIDE 0.9 % IJ SOLN: 3.0000 mL | INTRAMUSCULAR | Status: DC | PRN

## 2014-03-07 MED ORDER — TRAMADOL HCL 50 MG PO TABS
50.0000 mg | ORAL_TABLET | Freq: Four times a day (QID) | ORAL | Status: DC | PRN
  Administered 2014-03-08: 50 mg via ORAL
  Filled 2014-03-07 (×2): qty 1

## 2014-03-07 MED ORDER — LOSARTAN POTASSIUM 25 MG PO TABS
25.0000 mg | ORAL_TABLET | Freq: Every day | ORAL | Status: DC
  Administered 2014-03-07 – 2014-03-08 (×2): 25 mg via ORAL
  Filled 2014-03-07 (×2): qty 1

## 2014-03-07 MED ORDER — LIDOCAINE HCL (PF) 1 % IJ SOLN
INTRAMUSCULAR | Status: AC
  Filled 2014-03-07: qty 30

## 2014-03-07 MED ORDER — DILTIAZEM HCL ER COATED BEADS 120 MG PO CP24
120.0000 mg | ORAL_CAPSULE | Freq: Every day | ORAL | Status: DC
  Administered 2014-03-07: 120 mg via ORAL
  Filled 2014-03-07 (×2): qty 1

## 2014-03-07 MED ORDER — EZETIMIBE 10 MG PO TABS
10.0000 mg | ORAL_TABLET | Freq: Every day | ORAL | Status: DC
  Administered 2014-03-07 – 2014-03-08 (×2): 10 mg via ORAL
  Filled 2014-03-07 (×2): qty 1

## 2014-03-07 MED ORDER — ALPRAZOLAM 0.5 MG PO TABS
0.5000 mg | ORAL_TABLET | Freq: Every day | ORAL | Status: DC
  Administered 2014-03-08: 09:00:00 0.5 mg via ORAL
  Filled 2014-03-07: qty 1

## 2014-03-07 MED ORDER — INSULIN ASPART 100 UNIT/ML ~~LOC~~ SOLN
0.0000 [IU] | Freq: Three times a day (TID) | SUBCUTANEOUS | Status: DC
Start: 2014-03-07 — End: 2014-03-08
  Administered 2014-03-07: 2 [IU] via SUBCUTANEOUS
  Administered 2014-03-08: 09:00:00 1 [IU] via SUBCUTANEOUS

## 2014-03-07 MED ORDER — HYDROCODONE-ACETAMINOPHEN 10-325 MG PO TABS
1.0000 | ORAL_TABLET | Freq: Four times a day (QID) | ORAL | Status: DC | PRN
  Administered 2014-03-07 – 2014-03-08 (×3): 1 via ORAL
  Filled 2014-03-07 (×3): qty 1

## 2014-03-07 MED ORDER — SODIUM CHLORIDE 0.9 % IJ SOLN: 3.0000 mL | Freq: Two times a day (BID) | INTRAMUSCULAR | Status: DC

## 2014-03-07 MED ORDER — CELECOXIB 200 MG PO CAPS
200.0000 mg | ORAL_CAPSULE | Freq: Every day | ORAL | Status: DC | PRN
  Filled 2014-03-07: qty 1

## 2014-03-07 MED ORDER — ACETAMINOPHEN 325 MG PO TABS
650.0000 mg | ORAL_TABLET | Freq: Four times a day (QID) | ORAL | Status: DC | PRN
  Administered 2014-03-08: 02:00:00 650 mg via ORAL
  Filled 2014-03-07: qty 2

## 2014-03-07 MED ORDER — DIAZEPAM 5 MG PO TABS
5.0000 mg | ORAL_TABLET | Freq: Once | ORAL | Status: AC
  Administered 2014-03-07: 5 mg via ORAL
  Filled 2014-03-07: qty 1

## 2014-03-07 MED ORDER — FUROSEMIDE 20 MG PO TABS
20.0000 mg | ORAL_TABLET | Freq: Every day | ORAL | Status: DC | PRN
  Filled 2014-03-07: qty 1

## 2014-03-07 MED ORDER — ISOMETHEPTENE-APAP-DICHLORAL 65-325-100 MG PO CAPS
1.0000 | ORAL_CAPSULE | Freq: Four times a day (QID) | ORAL | Status: DC | PRN
  Filled 2014-03-07: qty 1

## 2014-03-07 MED ORDER — FENTANYL CITRATE 0.05 MG/ML IJ SOLN
INTRAMUSCULAR | Status: AC
  Filled 2014-03-07: qty 2

## 2014-03-07 MED ORDER — METOPROLOL SUCCINATE ER 25 MG PO TB24
25.0000 mg | ORAL_TABLET | Freq: Every day | ORAL | Status: DC
  Administered 2014-03-08: 25 mg via ORAL
  Filled 2014-03-07: qty 1

## 2014-03-07 MED ORDER — ALPRAZOLAM 0.5 MG PO TABS
1.0000 mg | ORAL_TABLET | Freq: Every day | ORAL | Status: DC
  Administered 2014-03-07: 0.5 mg via ORAL
  Filled 2014-03-07: qty 2

## 2014-03-07 MED ORDER — MIDAZOLAM HCL 2 MG/2ML IJ SOLN
INTRAMUSCULAR | Status: AC
  Filled 2014-03-07: qty 2

## 2014-03-07 MED ORDER — LIVING WELL WITH DIABETES BOOK
Freq: Once | Status: AC
  Administered 2014-03-07: 22:00:00
  Filled 2014-03-07: qty 1

## 2014-03-07 MED ORDER — NITROGLYCERIN 0.4 MG SL SUBL
0.4000 mg | SUBLINGUAL_TABLET | SUBLINGUAL | Status: DC | PRN
  Administered 2014-03-08: 0.4 mg via SUBLINGUAL

## 2014-03-07 MED ORDER — ASPIRIN EC 81 MG PO TBEC
81.0000 mg | DELAYED_RELEASE_TABLET | Freq: Every day | ORAL | Status: DC
Start: 2014-03-08 — End: 2014-03-08
  Administered 2014-03-08: 09:00:00 81 mg via ORAL
  Filled 2014-03-07: qty 1

## 2014-03-07 MED ORDER — CLOPIDOGREL BISULFATE 75 MG PO TABS
75.0000 mg | ORAL_TABLET | Freq: Every day | ORAL | Status: DC
  Administered 2014-03-08: 75 mg via ORAL
  Filled 2014-03-07: qty 1

## 2014-03-07 MED ORDER — SODIUM CHLORIDE 0.9 % IV SOLN: 250.0000 mL | INTRAVENOUS | Status: DC | PRN

## 2014-03-07 MED ORDER — SODIUM CHLORIDE 0.9 % IV SOLN
INTRAVENOUS | Status: AC
  Administered 2014-03-07: 17:00:00 via INTRAVENOUS

## 2014-03-07 MED ORDER — CLOPIDOGREL BISULFATE 300 MG PO TABS
ORAL_TABLET | ORAL | Status: AC
  Filled 2014-03-07: qty 2

## 2014-03-07 MED ORDER — ATORVASTATIN CALCIUM 40 MG PO TABS
40.0000 mg | ORAL_TABLET | Freq: Every day | ORAL | Status: DC
  Administered 2014-03-07 – 2014-03-08 (×2): 40 mg via ORAL
  Filled 2014-03-07 (×3): qty 1

## 2014-03-07 MED ORDER — SODIUM CHLORIDE 0.9 % IV SOLN
INTRAVENOUS | Status: DC
  Administered 2014-03-07: 07:00:00 via INTRAVENOUS

## 2014-03-07 MED ORDER — HEPARIN (PORCINE) IN NACL 2-0.9 UNIT/ML-% IJ SOLN
INTRAMUSCULAR | Status: AC
  Filled 2014-03-07: qty 1000

## 2014-03-07 MED ORDER — PANTOPRAZOLE SODIUM 40 MG PO TBEC: 40.0000 mg | DELAYED_RELEASE_TABLET | Freq: Every day | ORAL | Status: DC

## 2014-03-07 MED ORDER — ASPIRIN 81 MG PO CHEW: 81.0000 mg | CHEWABLE_TABLET | ORAL | Status: DC

## 2014-03-07 MED ORDER — LEVOTHYROXINE SODIUM 150 MCG PO TABS
150.0000 ug | ORAL_TABLET | ORAL | Status: DC
  Administered 2014-03-08: 07:00:00 150 ug via ORAL
  Filled 2014-03-07 (×3): qty 1

## 2014-03-07 MED ORDER — ALPRAZOLAM 0.25 MG PO TABS: 0.5000 mg | ORAL_TABLET | Freq: Two times a day (BID) | ORAL | Status: DC

## 2014-03-07 NOTE — H&P (View-Only) (Signed)
Primary care physician: Dr. Marton Redwood Primary cardiologist: Dr. Stanford Breed   HPI: This is a 68 year old female who was referred by Dr. Stanford Breed for evaluation and management of peripheral arterial disease. She has known history of coronary artery disease  s/p anterior MI in 1984 treated with thrombolytics and POBA. Last echo in 5/08: Normal LV function, distal septal HK, impaired LV relaxation, mild TR. Myoview 05/03/13: Small anteroapical infarct with moderate peri-infarct ischemia, EF 59%. LHC 05/11/13: Proximal LAD 30%, mid LAD 30%, EF 55-60%, apical inferior HK. Overall, her LAD was heavily calcified but she had no flow limiting stenosis. Abnormal nuclear study was felt to be related to prior anterior MI.  She reports progressive buttock and thigh claudication which started last summer but became very noticeable when she attempted to start an exercise program. The discomfort is usually and happens at short distance of walking. The symptoms or mostly on the right side. 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 She is not a smoker. She had cardiovascular  screening testing in November which showed mild to moderate bilateral carotid disease. No abdominal aortic aneurysm. ABI on the right 0.65. Left normal. lower extremity arterial duplex showed no significant infrainguinal disease but there was monophasic waveform in the right common femoral artery and below suggestive of significant inflow disease.    Current Outpatient Prescriptions  Medication Sig Dispense Refill  . acetaminophen (TYLENOL) 500 MG tablet Take 1,000 mg by mouth every 6 (six) hours as needed. Pain       . ALPRAZolam (XANAX) 0.5 MG tablet Take 1 mg by mouth. ONE TABLET IN AM, TWO TABLETS IN PM AND PRN X1 DURING THE DAY      . aspirin 81 MG tablet Take 81 mg by mouth every morning.       Marland Kitchen atorvastatin (LIPITOR) 40 MG tablet TAKE 1 TABLET DAILY  30 tablet  11  . diltiazem (CARDIZEM CD) 120  MG 24 hr capsule TAKE ONE CAPSULE EVERY DAY  30 capsule  11  . Ergocalciferol (VITAMIN D2 PO) Take one tablet by mouth daily      . FLUoxetine (PROZAC) 20 MG capsule Take 20 mg by mouth daily with breakfast.       . furosemide (LASIX) 20 MG tablet TAKE 1 TABLET BY MOUTH AS NEEDED FOR SWELLING  30 tablet  0  . ibuprofen (ADVIL,MOTRIN) 200 MG tablet Take 200 mg by mouth every 6 (six) hours as needed. Pain       . isometheptene-acetaminophen-dichloralphenazone (MIDRIN) 65-325-100 MG capsule Take 1 capsule by mouth 4 (four) times daily as needed. Headache       . levothyroxine (SYNTHROID, LEVOTHROID) 150 MCG tablet Take 150 mcg by mouth every morning.       Marland Kitchen losartan (COZAAR) 25 MG tablet TAKE 1 TABLET BY MOUTH EVERY DAY  30 tablet  11  . metFORMIN (GLUCOPHAGE) 1000 MG tablet Take 1,000 mg by mouth 2 (two) times daily.       . metoprolol succinate (TOPROL-XL) 25 MG 24 hr tablet TAKE 1 TABLET DAILY  30 tablet  11  . NITROSTAT 0.4 MG SL tablet PLACE 1 TABLET UNDER TONGUE IF NEEDED FOR CHEST PAIN  25 tablet  PRN  . omeprazole (PRILOSEC) 20 MG capsule Take 20 mg by mouth daily as needed. Heart burn       . Polyethyl Glycol-Propyl Glycol (SYSTANE) 0.4-0.3 % SOLN Place 1 drop into both eyes 2 (two) times daily as needed. Dry  eyes      . Tamsulosin HCl (FLOMAX) 0.4 MG CAPS Take one capsule by mouth daily after supper as needed      . ZETIA 10 MG tablet TAKE 1 TABLET BY MOUTH EVERY DAY  30 tablet  6   No current facility-administered medications for this visit.     Past Medical History  Diagnosis Date  . Ischemic heart disease   . Diabetes mellitus   . Obesity   . Chronic anxiety   . Nephrolithiasis   . Lumbar herniated disc   . Depression   . Situational stress   . Hyperlipemia   . History of bronchitis   . History of pneumonia   . Hypothyroidism   . Coronary artery disease CARDIOLOGIST- DR Vidal Schwalbe-- VISIT 04-01-2011 W/ CHART AND IN EPIC  . History of kidney stones S/P URETEROSCOPIC STONE  EXTRACTIONS  . Right ureteral calculus   . 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  NO ISCHEMIA. SUSPECT THIS REPRESENTS PRIOR MI BUT PATTERN UNUSUAL GIVEN WORSE DEFECT AT REST. APICAL HYPODINESIS BUT OVERALL PRESERVED EF.  Marland Kitchen History of acute anterior wall MI MARCH 1984    S/P ANGIOPLASTY AND THROMBOLYTICS  . History of hypercalcemia   . History of echocardiogram 05-20-2007    NORMAL LVSF. DISTAL SEPTAL HYPOKINESIA. MILD LEVH W/ IMPAIRED LV RELAXATION. MILD AORTIC SCLEROSIS. MILD TRICUSPID REGURGITATION  . Arthritis   . GERD (gastroesophageal reflux disease)   . PONV (postoperative nausea and vomiting)   . Panic attack as reaction to stress   . Female bladder prolapse   . Urge urinary incontinence   . Myocardial infarction     1984 MI with angioplasty    Past Surgical History  Procedure Laterality Date  . Left ureteroscopic laser litho/ stone extraction  12-16-2009  &  11-17-2007  . Right ureteroscopic stone extraction   07-04-2008  . Coronary angioplasty  1984  . Cardiac catheterization  04-18-2008; 05-08-2004;  11-29-1986; 08-19-1988    2009 REPORT---- MILD ANTERIOR HYPOKINESIS (FROM REMOTE MI), MILD CORONARY ATHEROSCLEROSIS W/ 30-40% NARROWING IN THE MID LAD & 50% NARROWING IN THE SMALL OBTUSE MARGINAL,  NORMAL RENAL ARTERIES W/ NO ABD. AORTIC DISEASE  . Carpal tunnel release  2005    RIGHT  . Extracorporeal shock wave lithotripsy  2007    X2  . Facial cosmetic surgery    . Ureteroscopy  01/25/2012    Procedure: URETEROSCOPY;  Surgeon: Claybon Jabs, MD;  Location: [facility] [place];  Service: Urology;  Laterality: Right;    History   Social History  . Marital Status: Married    Spouse Name: N/A    Number of Children: N/A  . Years of Education: N/A   Occupational History  . Not on file.   Social History Main Topics  . Smoking status: Former Smoker    Types:  Cigarettes    Quit date: 12/28/1982  . Smokeless tobacco: Never Used  . Alcohol Use: Yes     Comment: socially  . Drug Use: Not on file  . Sexual Activity: Not on file   Other Topics Concern  . Not on file   Social History Narrative  . No narrative on file    ROS: Extremely anxious, no fevers or chills, productive cough, hemoptysis, dysphasia, odynophagia, melena, hematochezia, dysuria, hematuria, rash, seizure activity, orthopnea, PND, pedal edema, claudication. Remaining systems are negative.  Constitutional: He is  oriented to person, place, and time. He appears well-developed and well-nourished. No distress.  HENT: No nasal discharge.  Head: Normocephalic and atraumatic.  Eyes: Pupils are equal and round.  No discharge. Neck: Normal range of motion. Neck supple. No JVD present. No thyromegaly present.  Cardiovascular: Normal rate, regular rhythm, normal heart sounds. Exam reveals no gallop and no friction rub. No murmur heard.  Pulmonary/Chest: Effort normal and breath sounds normal. No stridor. No respiratory distress. He has no wheezes. He has no rales. He exhibits no tenderness.  Abdominal: Soft. Bowel sounds are normal. He exhibits no distension. There is no tenderness. There is no rebound and no guarding.  Musculoskeletal: Normal range of motion. He exhibits no edema and no tenderness.  Neurological: He is alert and oriented to person, place, and time. Coordination normal.  Skin: Skin is warm and dry. No rash noted. He is not diaphoretic. No erythema. No pallor.  Psychiatric: He has a normal mood and affect. His behavior is normal. Judgment and thought content normal.  Vascular: Femoral pulse: Absent on the right side and +1 on the left side. Dorsalis pedis and posterior tibial: +1 on the right side and +2 on the left side.

## 2014-03-07 NOTE — Interval H&P Note (Signed)
History and Physical Interval Note:  03/07/2014 8:31 AM  [name] [name]  has presented today for surgery, with the diagnosis of PAD  The various methods of treatment have been discussed with the patient and family. After consideration of risks, benefits and other options for treatment, the patient has consented to  Procedure(s): ABDOMINAL ANGIOGRAM (N/A) as a surgical intervention .  The patient's history has been reviewed, patient examined, no change in status, stable for surgery.  I have reviewed the patient's chart and labs.  Questions were answered to the patient's satisfaction.     Kathlyn Sacramento

## 2014-03-07 NOTE — CV Procedure (Signed)
PERIPHERAL VASCULAR PROCEDURE  NAME:  [name] [name]   MRN: [id number] DOB:  03-31-1946   ADMIT DATE: 03/07/2014  Performing Cardiologist: Kathlyn Sacramento Primary Physician: Marton Redwood, MD Primary Cardiologist:  Dr. Stanford Breed  Procedures Performed:  Ultrasound-guided access into the right common femoral artery. Arterial access into the left common femoral artery without ultrasound guidance.  Abdominal Aortic Angiogram with Bi-Iliofemoral Runoff  Bilateral Lower Extremity Angiography (1st Order)  Bilateral common iliac artery kissing stent placement into the distal aorta  Bilateral Mynx closure device.    Indication(s):   Claudication    Consent: The procedure with Risks/Benefits/Alternatives and Indications was reviewed with the patient .  All questions were answered.  Medications:  Sedation:  4 mg IV Versed, 200 mcg IV Fentanyl  Contrast:  201 ml  Visipaque   Procedural details: The right groin was prepped, draped, and anesthetized with 1% lidocaine. There was no palpable pulse. Thus, I used ultrasound guidance identified artery. Using modified Seldinger technique, a 4 French micropuncture sheath was introduced into the right common femoral artery. This was exchanged into a 5 [place] sheath. A 5 Fr Short Pigtail Catheter was advanced of over a  Versicore wire into the descending Aorta to a level just above the renal arteries. A power injection of 45ml/sec contrast over 1 sec was performed for Abdominal Aortic Angiography.  The catheter was then pulled back to a level just above the Aortic bifurcation, and a second power injection was performed to evaluate the iliac arteries.   Bilateral lower extremity arterial run off was then performed via power injection of 7 ml / sec contrast for a total of 77 ml.   Interventional Procedure:  The patient was enrolled in the Endomax study. Arterial access was obtained into the left common femoral artery. A 7 French Bright tip sheath  was inserted. A 5 French sheath on the right side was exchanged into a 7 [place] Bright tip sheath. The study drug was given with therapeutic ACT. Both lesions were crossed with a Versicoe wire. After injection via the left femoral sheath, a localized dissection was noted in the left external iliac artery which was likely induced by the sheath. I elected to perform angiography with a pigtail catheter through the right side which confirmed that this was not flow limiting. Thus, this was not treated. Both sheaths were advanced to the distal aorta to cross the stenosis. I then advanced an 8 x 29 mm Genesis balloon expandable stents with the tip placed in the distal aorta. Simultaneous deployment was then performed to 10 atmospheres. There was still significant 30 percent stenosis on the right side. Thus, I elected to use an 8 x 20 mm mustang balloon on both sides. This was dilated to 14 atmospheres on the right side and 6 atmospheres on the left side simultaneously.There was 20% residual stenosis on the left side. Final angiography showed a small dissection extending into the distal aorta from the left side which was not flow limiting. Thus, I elected not to treat this.  Hemostasis was achieved with a  Mynx closure device on both sides. The patient tolerated the procedure well with no immediate complications.    Hemodynamics:  Central Aortic Pressure / Mean Aortic Pressure: 155/75  Findings:  Abdominal aorta: Small in size distally above the bifurcation with heavy calcifications into the bilateral iliac arteries. No evidence of aneurysm.  Left renal artery: Normal  Right renal artery: Normal  Celiac artery: Patent  Superior mesenteric artery: Patent  Right common iliac artery: Heavily calcified with 95% ostial stenosis.  Right internal iliac artery: Patent  Right external iliac artery: Normal  Right common femoral artery: Normal  Right profunda femoral artery: Normal  Right superficial  femoral artery: Normal  Right popliteal artery: Normal  Left common iliac artery:  50-60% ostial stenosis extending into the distal aorta  Left internal iliac artery: 40% ostial stenosis  Left external iliac artery: 20% ostial stenosis  Left common femoral artery: 20% stenosis  Left profunda femoral artery: Normal  Left superficial femoral artery:  Normal  Left popliteal artery: Normal  Three-vessel runoff below the knee.   Conclusions: 1. 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 2. No significant infrainguinal disease. 3. Successful kissing stent placement to bilateral common iliac arteries extending into the distal aorta 4. Small nonflow limiting dissection involving the left external iliac artery which was induced by the sheath. This was left to be treated medically. Tiny dissection involving the distal aorta after post dilation on the left side. This was also nonflow limiting and was left to be treated medically.  Recommendations:  Continue dual antiplatelet therapy for at least one month. I recommend observing the patient overnight with blood pressure control.    Kathlyn Sacramento, MD, [facility] 03/07/2014 10:23 AM

## 2014-03-08 DIAGNOSIS — I739 Peripheral vascular disease, unspecified: Secondary | ICD-10-CM

## 2014-03-08 DIAGNOSIS — E785 Hyperlipidemia, unspecified: Secondary | ICD-10-CM | POA: Diagnosis present

## 2014-03-08 DIAGNOSIS — R079 Chest pain, unspecified: Secondary | ICD-10-CM

## 2014-03-08 LAB — CBC
HCT: 35 % — ABNORMAL LOW (ref 36.0–46.0)
Hemoglobin: 11.5 g/dL — ABNORMAL LOW (ref 12.0–15.0)
MCH: 28 pg (ref 26.0–34.0)
MCHC: 32.9 g/dL (ref 30.0–36.0)
MCV: 85.4 fL (ref 78.0–100.0)
PLATELETS: 304 10*3/uL (ref 150–400)
RBC: 4.1 MIL/uL (ref 3.87–5.11)
RDW: 14.4 % (ref 11.5–15.5)
WBC: 10.2 10*3/uL (ref 4.0–10.5)

## 2014-03-08 LAB — GLUCOSE, CAPILLARY: GLUCOSE-CAPILLARY: 122 mg/dL — AB (ref 70–99)

## 2014-03-08 LAB — TROPONIN I: Troponin I: <0.3 ng/mL (ref <0.30)

## 2014-03-08 MED ORDER — NITROGLYCERIN 0.4 MG SL SUBL
SUBLINGUAL_TABLET | SUBLINGUAL | Status: AC
  Administered 2014-03-08: 10:00:00 0.4 mg
  Filled 2014-03-08: qty 1

## 2014-03-08 MED ORDER — CLOPIDOGREL BISULFATE 75 MG PO TABS: 75.0000 mg | ORAL_TABLET | Freq: Every day | ORAL | Status: DC

## 2014-03-08 MED ORDER — METFORMIN HCL 1000 MG PO TABS: 1000.0000 mg | ORAL_TABLET | Freq: Two times a day (BID) | ORAL | Status: AC

## 2014-03-08 NOTE — Progress Notes (Signed)
Earlier patient c/o cheast pain that was treated with SL ntg EKG and troponin done which was WNL. Patient later felt that it that anxiety was the issue. Patient stable and ready to go home she was d/c per order with husband at bedside.

## 2014-03-08 NOTE — Discharge Summary (Signed)
Patient ID: [name] [name],  MRN: [id number], DOB/AGE: 1946-08-28 68 y.o.  Admit date: 03/07/2014 Discharge date: 03/08/2014  Primary Care Provider: Dr Marton Redwood Primary Cardiologist: Dr Stanford Breed  Discharge Diagnoses Principal Problem:   Claudication Active Problems:   PAD (peripheral artery disease)- bilat CIA stenting 03/07/14   Type 2 diabetes mellitus with polyneuropathy   Chronic anxiety   CAD POBA '84, minor CAD 5/14 after abn Nuc   Dyslipidemia    Procedures: PVA- bilat iliac PTA/ stenting 03/07/14   Hospital Course:  This is a 68 year old female who was referred to Dr Fletcher Anon by Dr. Stanford Breed for evaluation and management of peripheral arterial disease. She has known history of coronary artery disease s/p anterior MI in 1984 treated with thrombolytics and POBA. Last echo in 5/08: Normal LV function, distal septal HK, impaired LV relaxation, mild TR. Myoview 05/03/13: Small anteroapical infarct with moderate peri-infarct ischemia, EF 59%. LHC 05/11/13: Proximal LAD 30%, mid LAD 30%, EF 55-60%, apical inferior HK. Overall, her LAD was heavily calcified but she had no flow limiting stenosis. Abnormal nuclear study was felt to be related to prior anterior MI.                   She reports progressive buttock and thigh claudication which started last summer but became very noticeable when she attempted to start an exercise program. The discomfort is usually and happens at short distance of walking. The symptoms or mostly on the right side. 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 She has chronic anxiety. She is not a smoker. She had cardiovascular screening testing in November which showed mild to moderate bilateral carotid disease. No abdominal aortic aneurysm. ABI on the right 0.65. Left normal. lower extremity arterial duplex showed no significant infrainguinal disease but there was monophasic waveform in the right common femoral artery and  below suggestive of significant inflow disease. She was admitted for elective PV angiogram 03/07/14. This was done by Dr Fletcher Anon. She underwent successful kissing stent placement to bilateral common iliac arteries extending into the distal aorta. There was a small nonflow limiting dissection involving the left external iliac artery which was induced by the sheath. This was left to be treated medically. Tiny dissection involving the distal aorta after post dilation on the left side. This was also nonflow limiting and was left to be treated medically.                     The next morning she was being prepared for discharge when she developed Lt arm and Lt neck pain "it's my angina". She was given NTG x 1. Her EKG showed no acute changes and a Troponin was negative. An hour later she felt better and admitted the episode was probably related to anxiety. She is discharged in stable condition and will follow up in two weeks.      Discharge Vitals:  Blood pressure 117/48, pulse 66, temperature 98.2 F (36.8 C), temperature source Oral, resp. rate 18, height $RemoveBe'5\' 9"'[id number]$  (1.753 m), weight 199 lb 8.3 oz (90.5 kg), SpO2 95.00%.    Labs: Results for orders placed during the hospital encounter of 03/07/14 (from the past 48 hour(s))  CBC     Status: Abnormal   Collection Time    03/07/14  7:38 AM      Result Value Ref Range   WBC 9.1  4.0 - 10.5 K/uL   RBC 4.19  3.87 -  5.11 MIL/uL   Hemoglobin 11.9 (*) 12.0 - 15.0 g/dL   HCT 35.9 (*) 36.0 - 46.0 %   MCV 85.7  78.0 - 100.0 fL   MCH 28.4  26.0 - 34.0 pg   MCHC 33.1  30.0 - 36.0 g/dL   RDW 14.2  11.5 - 15.5 %   Platelets 302  150 - 400 K/uL  BASIC METABOLIC PANEL     Status: Abnormal   Collection Time    03/07/14  7:38 AM      Result Value Ref Range   Sodium 141  137 - 147 mEq/L   Potassium 4.4  3.7 - 5.3 mEq/L   Chloride 104  96 - 112 mEq/L   CO2 26  19 - 32 mEq/L   Glucose, Bld 122 (*) 70 - 99 mg/dL   BUN 20  6 - 23 mg/dL   Creatinine, Ser 0.81  0.50 -  1.10 mg/dL   Calcium 10.0  8.4 - 10.5 mg/dL   GFR calc non Af Amer 72 (*) >90 mL/min   GFR calc Af Amer 84 (*) >90 mL/min   Comment: (NOTE)     The eGFR has been calculated using the CKD EPI equation.     This calculation has not been validated in all clinical situations.     eGFR's persistently <90 mL/min signify possible Chronic Kidney     Disease.  PROTIME-INR     Status: None   Collection Time    03/07/14  7:38 AM      Result Value Ref Range   Prothrombin Time 13.1  11.6 - 15.2 seconds   INR 1.01  0.00 - 1.49  GLUCOSE, CAPILLARY     Status: Abnormal   Collection Time    03/07/14 11:03 AM      Result Value Ref Range   Glucose-Capillary 111 (*) 70 - 99 mg/dL  GLUCOSE, CAPILLARY     Status: Abnormal   Collection Time    03/07/14  5:02 PM      Result Value Ref Range   Glucose-Capillary 155 (*) 70 - 99 mg/dL  GLUCOSE, CAPILLARY     Status: None   Collection Time    03/07/14 10:03 PM      Result Value Ref Range   Glucose-Capillary 95  70 - 99 mg/dL   Comment 1 Documented in Chart     Comment 2 ROSELLE RN    GLUCOSE, CAPILLARY     Status: Abnormal   Collection Time    03/08/14  8:22 AM      Result Value Ref Range   Glucose-Capillary 122 (*) 70 - 99 mg/dL  CBC     Status: Abnormal   Collection Time    03/08/14  9:35 AM      Result Value Ref Range   WBC 10.2  4.0 - 10.5 K/uL   RBC 4.10  3.87 - 5.11 MIL/uL   Hemoglobin 11.5 (*) 12.0 - 15.0 g/dL   HCT 35.0 (*) 36.0 - 46.0 %   MCV 85.4  78.0 - 100.0 fL   MCH 28.0  26.0 - 34.0 pg   MCHC 32.9  30.0 - 36.0 g/dL   RDW 14.4  11.5 - 15.5 %   Platelets 304  150 - 400 K/uL  TROPONIN I     Status: None   Collection Time    03/08/14  9:35 AM      Result Value Ref Range   Troponin I <0.30  <0.30 ng/mL  Comment:            Due to the release kinetics of cTnI,     a negative result within the first hours     of the onset of symptoms does not rule out     myocardial infarction with certainty.     If myocardial infarction is  still suspected,     repeat the test at appropriate intervals.    Disposition:  Follow-up Information   Follow up with Richardson Dopp, PA-C On 03/23/2014. (8:30 )    Specialty:  Physician Assistant   Contact information:   [address] [id number] [phone]       Discharge Medications:    Medication List         acetaminophen 325 MG tablet  Commonly known as:  TYLENOL  Take 650 mg by mouth every 6 (six) hours as needed for mild pain.     albuterol 108 (90 BASE) MCG/ACT inhaler  Commonly known as:  PROVENTIL HFA;VENTOLIN HFA  Inhale 1 puff into the lungs every 6 (six) hours as needed for wheezing or shortness of breath.     ALPRAZolam 0.5 MG tablet  Commonly known as:  XANAX  Take 0.5-1 mg by mouth 2 (two) times daily. Take 1 tablet in the morning and 2 tablets at night     aspirin EC 81 MG tablet  Take 81 mg by mouth daily.     atorvastatin 40 MG tablet  Commonly known as:  LIPITOR  Take 40 mg by mouth daily.     celecoxib 200 MG capsule  Commonly known as:  CELEBREX  Take 200 mg by mouth daily as needed for moderate pain.     clopidogrel 75 MG tablet  Commonly known as:  PLAVIX  Take 1 tablet (75 mg total) by mouth daily with breakfast.     diltiazem 120 MG 24 hr capsule  Commonly known as:  CARDIZEM CD  Take 120 mg by mouth at bedtime.     ezetimibe 10 MG tablet  Commonly known as:  ZETIA  Take 10 mg by mouth daily.     FLUoxetine 20 MG capsule  Commonly known as:  PROZAC  Take 20 mg by mouth daily with breakfast.     furosemide 20 MG tablet  Commonly known as:  LASIX  Take 20 mg by mouth daily as needed for fluid or edema.     ibuprofen 200 MG tablet  Commonly known as:  ADVIL,MOTRIN  Take 200 mg by mouth every 6 (six) hours as needed (joint pain and inflammation).     isometheptene-acetaminophen-dichloralphenazone 65-325-100 MG capsule  Commonly known as:  MIDRIN  Take 1 capsule by mouth 4 (four) times daily as  needed for migraine.     levothyroxine 150 MCG tablet  Commonly known as:  SYNTHROID, LEVOTHROID  Take 150 mcg by mouth every morning.     losartan 25 MG tablet  Commonly known as:  COZAAR  Take 25 mg by mouth daily.     metFORMIN 1000 MG tablet  Commonly known as:  GLUCOPHAGE  Take 1 tablet (1,000 mg total) by mouth 2 (two) times daily.  Start taking on:  03/10/2014     metoprolol succinate 25 MG 24 hr tablet  Commonly known as:  TOPROL-XL  Take 25 mg by mouth daily.     nitroGLYCERIN 0.4 MG SL tablet  Commonly known as:  NITROSTAT  Place 0.4 mg under the tongue every  5 (five) minutes as needed for chest pain.     omeprazole 20 MG capsule  Commonly known as:  PRILOSEC  Take 20 mg by mouth daily as needed (heart burn).     OVER THE COUNTER MEDICATION  Apply 1 each topically 2 (two) times daily. Oil free acne wash     SYSTANE 0.4-0.3 % Soln  Generic drug:  Polyethyl Glycol-Propyl Glycol  Place 1 drop into both eyes 2 (two) times daily as needed. Dry eyes     traMADol 50 MG tablet  Commonly known as:  ULTRAM  Take 50 mg by mouth every 6 (six) hours as needed for moderate pain.     VITAMIN D2 PO  Take 1 capsule by mouth every Monday.         Duration of Discharge Encounter: 40 minutes including physician time, evaluating new sx and going over the plan.  Signed, Kerin Ransom PA-C 03/08/2014 10:38 AM  I have examined the patient and reviewed assessment and plan and discussed with patient.  Agree with above as stated.  CP resolved. Troponin negative. Stable for discharge. Minimize Celebreax use if possible. F/u with Dr. Fletcher Anon.  Eve Rey S.

## 2014-03-08 NOTE — Progress Notes (Signed)
    Subjective:  Some leg pain at rest- this is chronic  Objective:  Vital Signs in the last 24 hours: Temp:  [97.6 F (36.4 C)-98.5 F (36.9 C)] 98 F (36.7 C) (03/12 0555) Pulse Rate:  [58-75] 67 (03/12 0555) Resp:  [16-20] 20 (03/12 0555) BP: (110-139)/(68-114) 110/72 mmHg (03/12 0555) SpO2:  [94 %-100 %] 96 % (03/12 0555) Weight:  [199 lb 8.3 oz (90.5 kg)] 199 lb 8.3 oz (90.5 kg) (03/12 0006)  Intake/Output from previous day:  Intake/Output Summary (Last 24 hours) at 03/08/14 0757 Last data filed at 03/08/14 0058  Gross per 24 hour  Intake   1430 ml  Output    850 ml  Net    580 ml    Physical Exam: General appearance: alert, cooperative, no distress and anxious Lungs: clear to auscultation bilaterally Heart: regular rate and rhythm Extremities: Both groin dressings intact, no hematoma   Rate: 66  Rhythm: normal sinus rhythm  Lab Results:  Recent Labs  03/07/14 0738  WBC 9.1  HGB 11.9*  PLT 302    Recent Labs  03/07/14 0738  NA 141  K 4.4  CL 104  CO2 26  GLUCOSE 122*  BUN 20  CREATININE 0.81   No results found for this basename: TROPONINI, CK, MB,  in the last 72 hours  Recent Labs  03/07/14 0738  INR 1.01    Imaging: No results found.  Cardiac Studies:  Assessment/Plan:   Principal Problem:   Claudication Active Problems:   PAD (peripheral artery disease)- bilat CIA stenting 03/07/14   Type 2 diabetes mellitus with polyneuropathy   Chronic anxiety   CAD POBA '84, minor CAD 5/14 after abn Nuc   Dyslipidemia    PLAN: DC, f/u with Dr Stanford Breed.   Kerin Ransom PA-C Beeper [phone] 03/08/2014, 7:57 AM   I have examined the patient and reviewed assessment and plan and discussed with patient.  Agree with above as stated.   Patient had some left groin pain. This was examined and showed no swelling, hematoma. Bilateral posterior tibial pulses are palpable, 1+. Will let her walk. Plan for discharge later today.   Jarred Purtee  S.

## 2014-03-08 NOTE — Progress Notes (Signed)
Pt now complaining of "angina". Lt arm pain. Will check EKG and Troponin.  Kerin Ransom PA-C 03/08/2014 9:35 AM  Agree with above plan. If she feels better and enzymes negative, plan discharged later today.  Oneda Duffett S.

## 2014-03-23 ENCOUNTER — Ambulatory Visit (INDEPENDENT_AMBULATORY_CARE_PROVIDER_SITE_OTHER): Payer: Medicare Other | Admitting: Physician Assistant

## 2014-03-23 ENCOUNTER — Encounter: Payer: Self-pay | Admitting: Cardiology

## 2014-03-23 ENCOUNTER — Ambulatory Visit (HOSPITAL_COMMUNITY): Payer: Medicare Other | Attending: Cardiology | Admitting: *Deleted

## 2014-03-23 ENCOUNTER — Encounter: Payer: Self-pay | Admitting: Physician Assistant

## 2014-03-23 VITALS — BP 115/70 | Resp 64 | Ht 69.0 in | Wt 197.0 lb

## 2014-03-23 DIAGNOSIS — I2510 Atherosclerotic heart disease of native coronary artery without angina pectoris: Secondary | ICD-10-CM

## 2014-03-23 DIAGNOSIS — E785 Hyperlipidemia, unspecified: Secondary | ICD-10-CM

## 2014-03-23 DIAGNOSIS — I739 Peripheral vascular disease, unspecified: Secondary | ICD-10-CM

## 2014-03-23 DIAGNOSIS — I10 Essential (primary) hypertension: Secondary | ICD-10-CM

## 2014-03-23 NOTE — Progress Notes (Signed)
ABI's complete

## 2014-03-23 NOTE — Patient Instructions (Signed)
Your physician recommends that you schedule a follow-up appointment in: 3 months with Dr. Arida.  Your physician recommends that you continue on your current medications as directed. Please refer to the Current Medication list given to you today.   

## 2014-03-23 NOTE — Progress Notes (Signed)
[address] [place], [place]  [address] Phone: [phone] Fax:  [phone]  Date:  03/23/2014   ID:  [name], [name] [birth year], MRN [id number]  PCP:  Marton Redwood, MD  Cardiologist:  Dr. Kirk Ruths  PV:  Dr. Kathlyn Sacramento    History of Present Illness: [name] [name] is a 68 y.o. female with a hx of CAD, s/p anterior MI in 1984 treated with thrombolytics and POBA, DM, anxiety, HL.  She was recently evaluated by Dr. Kathlyn Sacramento for R > L buttock and thigh claudication.  ABI on the R was abnormal at 0.65.  She was set up for abdominal aortic angiogram with bi-iliofemoral runoff 03/07/14.  She has severe ostial 95% stenosis of the R CIA and borderline stenosis of the ostial L CIA (50-60%).  She underwent kissing stent placement to the bilateral CIA extending into the distal aorta.  Procedure was c/b by small non-flow limiting dissection involving L EIA and tiny dissection of distal aorta that was not flow limiting.  These were treated medically.  Patient was prepped for discharge the next morning but complained of left arm and neck pain consistent with prior angina. She had nitroglycerin x1. Cardiac markers were negative. She felt her symptoms were likely related to anxiety and she was discharged home.  She is doing well. She denies any further LE claudication. She has occasional chest pain related to stress for which she takes nitroglycerin. She denies any exertional chest pain. She denies any significant dyspnea. She denies orthopnea, PND or edema. She denies syncope.  Studies:  - Echo in 5/08: Normal LV function, distal septal HK, impaired LV relaxation, mild TR.   - Myoview 05/03/13: Small anteroapical infarct with moderate peri-infarct ischemia, EF 59%.   - LHC 05/11/13: Proximal LAD 30%, mid LAD 30%, EF 55-60%, apical inferior HK. Overall, her LAD was heavily calcified but she had no flow limiting stenosis. Abnormal nuclear study was felt to be related to prior  anterior MI. Continued medical therapy recommended.  - Abdominal US (02/2014): No AAA  Recent Labs: 03/07/2014: Creatinine 0.81; Potassium 4.4  03/08/2014: Hemoglobin 11.5*   Wt Readings from Last 3 Encounters:  03/23/14 197 lb (89.359 kg)  03/08/14 199 lb 8.3 oz (90.5 kg)  03/08/14 199 lb 8.3 oz (90.5 kg)     Past Medical History  Diagnosis Date  . Ischemic heart disease   . Obesity   . Chronic anxiety   . Lumbar herniated disc   . Depression   . Situational stress   . Hyperlipemia   . Hypothyroidism   . Coronary artery disease CARDIOLOGIST- DR Vidal Schwalbe-- VISIT 04-01-2011 W/ CHART AND IN EPIC  . Right ureteral calculus   . 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  NO ISCHEMIA. SUSPECT THIS REPRESENTS PRIOR MI BUT PATTERN UNUSUAL GIVEN WORSE DEFECT AT REST. APICAL HYPODINESIS BUT OVERALL PRESERVED EF.  Marland Kitchen History of hypercalcemia   . History of echocardiogram 05-20-2007    NORMAL LVSF. DISTAL SEPTAL HYPOKINESIA. MILD LEVH W/ IMPAIRED LV RELAXATION. MILD AORTIC SCLEROSIS. MILD TRICUSPID REGURGITATION  . Arthritis   . GERD (gastroesophageal reflux disease)   . PONV (postoperative nausea and vomiting)   . Panic attack as reaction to stress   . Female bladder prolapse   . Urge urinary incontinence   . Nephrolithiasis     "chronic" (03/07/2014)  . Family history  of anesthesia complication     "sister had PONV" (03/07/2014)  . Pneumonia   . COPD (chronic obstructive pulmonary disease)   . 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 Type II diabetes mellitus   . Daily headache   . Migraine     "frequently; have had them since I was very young"  . PAD (peripheral artery disease)     Current Outpatient Prescriptions  Medication Sig Dispense Refill  . acetaminophen (TYLENOL) 325 MG tablet Take 650 mg by mouth every 6 (six) hours as needed  for mild pain.      Marland Kitchen albuterol (PROVENTIL HFA;VENTOLIN HFA) 108 (90 BASE) MCG/ACT inhaler Inhale 1 puff into the lungs every 6 (six) hours as needed for wheezing or shortness of breath.      . ALPRAZolam (XANAX) 0.5 MG tablet Take 0.5-1 mg by mouth 2 (two) times daily. Take 1 tablet in the morning and 2 tablets at night      . aspirin EC 81 MG tablet Take 81 mg by mouth daily.      Marland Kitchen atorvastatin (LIPITOR) 40 MG tablet Take 40 mg by mouth daily.      . celecoxib (CELEBREX) 200 MG capsule Take 200 mg by mouth daily as needed for moderate pain.      Marland Kitchen clopidogrel (PLAVIX) 75 MG tablet Take 1 tablet (75 mg total) by mouth daily with breakfast.  30 tablet  5  . diltiazem (CARDIZEM CD) 120 MG 24 hr capsule Take 120 mg by mouth at bedtime.      . Ergocalciferol (VITAMIN D2 PO) Take 1 capsule by mouth every Monday.       . ezetimibe (ZETIA) 10 MG tablet Take 10 mg by mouth daily.      Marland Kitchen FLUoxetine (PROZAC) 20 MG capsule Take 20 mg by mouth daily with breakfast.       . furosemide (LASIX) 20 MG tablet Take 20 mg by mouth daily as needed for fluid or edema.      Marland Kitchen ibuprofen (ADVIL,MOTRIN) 200 MG tablet Take 200 mg by mouth every 6 (six) hours as needed (joint pain and inflammation).       . isometheptene-acetaminophen-dichloralphenazone (MIDRIN) 65-325-100 MG capsule Take 1 capsule by mouth 4 (four) times daily as needed for migraine.       Marland Kitchen levothyroxine (SYNTHROID, LEVOTHROID) 150 MCG tablet Take 150 mcg by mouth every morning.       Marland Kitchen losartan (COZAAR) 25 MG tablet Take 25 mg by mouth daily.      . metFORMIN (GLUCOPHAGE) 1000 MG tablet Take 1 tablet (1,000 mg total) by mouth 2 (two) times daily.      . metoprolol succinate (TOPROL-XL) 25 MG 24 hr tablet Take 25 mg by mouth daily.      . nitroGLYCERIN (NITROSTAT) 0.4 MG SL tablet Place 0.4 mg under the tongue every 5 (five) minutes as needed for chest pain.      Marland Kitchen omeprazole (PRILOSEC) 20 MG capsule Take 20 mg by mouth daily as needed (heart burn).        Marland Kitchen OVER THE COUNTER MEDICATION Apply 1 each topically 2 (two) times daily. Oil free acne wash      . Polyethyl Glycol-Propyl Glycol (SYSTANE) 0.4-0.3 % SOLN Place 1 drop into both eyes 2 (two) times daily as needed. Dry eyes      . traMADol (ULTRAM) 50 MG tablet Take 50 mg by mouth every 6 (six) hours as needed  for moderate pain.       No current facility-administered medications for this visit.    Allergies:   Cephalosporins   Social History:  The patient  reports that she quit smoking about 31 years ago. Her smoking use included Cigarettes. She has a 17 pack-year smoking history. She has never used smokeless tobacco. She reports that she drinks alcohol. She reports that she does not use illicit drugs.   Family History:  The patient's family history includes Heart attack in her father; Heart disease in her brother and mother.   ROS:  Please see the history of present illness.      All other systems reviewed and negative.   PHYSICAL EXAM: VS:  BP 115/70  Resp 64  Ht 5\' 9"  (1.753 m)  Wt 197 lb (89.359 kg)  BMI 29.08 kg/m2 Well nourished, well developed, in no acute distress HEENT: normal Neck: no JVD Cardiac:  normal S1, S2; RRR; no murmur Lungs:  clear to auscultation bilaterally, no wheezing, rhonchi or rales Abd: soft, nontender, no hepatomegaly Ext: no edemabilateral FA sites without hematoma or bruit Skin: warm and dry Neuro:  CNs 2-12 intact, no focal abnormalities noted  EKG:  NSR, HR 64, no ST changes     ASSESSMENT AND PLAN:  1. PAD:  Doing well after recent angioplasty and stenting to bilateral CIA.  She has ABI/arterial dopplers arranged for today.  She will remain on Plavix for a total of 30 days.   2. CAD:  No angina.  Continue ASA, statin, beta blocker.  3. Hypertension:  Controlled.  4. Hyperlipidemia:  Continue statin.  5. Disposition:  F/u with Dr. Kathlyn Sacramento in 3 mos and Dr. Kirk Ruths as planned.   Signed, Richardson Dopp, PA-C  03/23/2014 8:38 AM

## 2014-05-08 ENCOUNTER — Ambulatory Visit: Payer: Medicare Other

## 2014-05-10 ENCOUNTER — Other Ambulatory Visit: Payer: Self-pay | Admitting: Obstetrics and Gynecology

## 2014-05-10 DIAGNOSIS — N63 Unspecified lump in unspecified breast: Secondary | ICD-10-CM

## 2014-05-14 ENCOUNTER — Other Ambulatory Visit: Payer: Self-pay | Admitting: *Deleted

## 2014-05-14 MED ORDER — ATORVASTATIN CALCIUM 40 MG PO TABS: 40.0000 mg | ORAL_TABLET | Freq: Every day | ORAL | Status: DC

## 2014-05-14 MED ORDER — LOSARTAN POTASSIUM 25 MG PO TABS: 25.0000 mg | ORAL_TABLET | Freq: Every day | ORAL | Status: DC

## 2014-05-14 MED ORDER — DILTIAZEM HCL ER COATED BEADS 120 MG PO CP24: 120.0000 mg | ORAL_CAPSULE | Freq: Every day | ORAL | Status: DC

## 2014-06-01 ENCOUNTER — Ambulatory Visit: Payer: Medicare Other | Admitting: Cardiovascular Disease

## 2014-06-07 ENCOUNTER — Ambulatory Visit
Admission: RE | Admit: 2014-06-07 | Discharge: 2014-06-07 | Disposition: A | Discharge status: Home / Self Care | Payer: Medicare Other | Source: Ambulatory Visit | Attending: Obstetrics and Gynecology | Admitting: Obstetrics and Gynecology

## 2014-06-07 DIAGNOSIS — N63 Unspecified lump in unspecified breast: Secondary | ICD-10-CM

## 2014-06-08 ENCOUNTER — Other Ambulatory Visit: Payer: Self-pay | Admitting: *Deleted

## 2014-06-08 MED ORDER — METOPROLOL SUCCINATE ER 25 MG PO TB24: 25.0000 mg | ORAL_TABLET | Freq: Every day | ORAL | Status: DC

## 2014-06-12 ENCOUNTER — Ambulatory Visit (INDEPENDENT_AMBULATORY_CARE_PROVIDER_SITE_OTHER): Payer: Medicare Other | Admitting: Cardiovascular Disease

## 2014-06-12 ENCOUNTER — Encounter: Payer: Self-pay | Admitting: Cardiovascular Disease

## 2014-06-12 VITALS — BP 104/64 | HR 73 | Ht 69.0 in | Wt 199.4 lb

## 2014-06-12 DIAGNOSIS — I2510 Atherosclerotic heart disease of native coronary artery without angina pectoris: Secondary | ICD-10-CM

## 2014-06-12 DIAGNOSIS — E785 Hyperlipidemia, unspecified: Secondary | ICD-10-CM

## 2014-06-12 DIAGNOSIS — I739 Peripheral vascular disease, unspecified: Secondary | ICD-10-CM

## 2014-06-12 NOTE — Assessment & Plan Note (Signed)
On Atorvastatin 

## 2014-06-12 NOTE — Patient Instructions (Signed)
Your physician wants you to follow-up in: 6 MONTHS with Dr Arida.  You will receive a reminder letter in the mail two months in advance. If you don't receive a letter, please call our office to schedule the follow-up appointment.  Your physician recommends that you continue on your current medications as directed. Please refer to the Current Medication list given to you today.  

## 2014-06-12 NOTE — Progress Notes (Signed)
Primary care physician: Dr. Marton Redwood Primary cardiologist: Dr. Stanford Breed   HPI: This is a 68 year old female who is here for a follow up visit regarding  peripheral arterial disease. She has known history of coronary artery disease  s/p anterior MI in 1984 treated with thrombolytics and POBA. Last echo in 5/08: Normal LV function, distal septal HK, impaired LV relaxation, mild TR. Myoview 05/03/13: Small anteroapical infarct with moderate peri-infarct ischemia, EF 59%. LHC 05/11/13: Proximal LAD 30%, mid LAD 30%, EF 55-60%, apical inferior HK. Overall, her LAD was heavily calcified but she had no flow limiting stenosis.  She was seen in March for  progressive buttock and thigh claudication which started last summer but became very noticeable when she attempted to start an exercise program. She had cardiovascular  screening testing in November which showed mild to moderate bilateral carotid disease. No abdominal aortic aneurysm. ABI on the right 0.65. Left normal. lower extremity arterial duplex showed no significant infrainguinal disease but there was monophasic waveform in the right common femoral artery and below suggestive of significant inflow disease.  I proceeded with angiography which showed severe right common iliac artery stenosis and borderline left common iliac artery stenosis with no infrainguinal disease. I performed bilateral kissing stent placement on common iliac arteries. Post procedure ABI was normal.  No claudication. She has chronic muscle aches in upper and lower extremities.    Current Outpatient Prescriptions  Medication Sig Dispense Refill  . acetaminophen (TYLENOL) 325 MG tablet Take 650 mg by mouth every 6 (six) hours as needed for mild pain.      Marland Kitchen albuterol (PROVENTIL HFA;VENTOLIN HFA) 108 (90 BASE) MCG/ACT inhaler Inhale 1 puff into the lungs every 6 (six) hours as needed for wheezing or shortness of breath.      . ALPRAZolam (XANAX) 0.5 MG tablet Take 0.5-1 mg by mouth  2 (two) times daily. Take 1 tablet in the morning and 2 tablets at night      . aspirin EC 81 MG tablet Take 81 mg by mouth daily.      Marland Kitchen atorvastatin (LIPITOR) 40 MG tablet Take 1 tablet (40 mg total) by mouth daily.  30 tablet  3  . celecoxib (CELEBREX) 200 MG capsule Take 200 mg by mouth daily as needed for moderate pain.      Marland Kitchen diltiazem (CARDIZEM CD) 120 MG 24 hr capsule Take 1 capsule (120 mg total) by mouth at bedtime.  30 capsule  3  . Ergocalciferol (VITAMIN D2 PO) Take 1 capsule by mouth every Monday.       . ezetimibe (ZETIA) 10 MG tablet Take 10 mg by mouth daily.      Marland Kitchen FLUoxetine (PROZAC) 20 MG capsule Take 20 mg by mouth daily with breakfast.       . furosemide (LASIX) 20 MG tablet Take 20 mg by mouth daily as needed for fluid or edema.      Marland Kitchen ibuprofen (ADVIL,MOTRIN) 200 MG tablet Take 200 mg by mouth every 6 (six) hours as needed (joint pain and inflammation).       . isometheptene-acetaminophen-dichloralphenazone (MIDRIN) 65-325-100 MG capsule Take 1 capsule by mouth 4 (four) times daily as needed for migraine.       Marland Kitchen levothyroxine (SYNTHROID, LEVOTHROID) 150 MCG tablet Take 150 mcg by mouth every morning.       Marland Kitchen losartan (COZAAR) 25 MG tablet Take 1 tablet (25 mg total) by mouth daily.  30 tablet  3  . metFORMIN (GLUCOPHAGE) 1000  MG tablet Take 1 tablet (1,000 mg total) by mouth 2 (two) times daily.      . metoprolol succinate (TOPROL-XL) 25 MG 24 hr tablet Take 1 tablet (25 mg total) by mouth daily.  30 tablet  1  . nitroGLYCERIN (NITROSTAT) 0.4 MG SL tablet Place 0.4 mg under the tongue every 5 (five) minutes as needed for chest pain.      Marland Kitchen omeprazole (PRILOSEC) 20 MG capsule Take 20 mg by mouth daily as needed (heart burn).       Marland Kitchen OVER THE COUNTER MEDICATION Apply 1 each topically 2 (two) times daily. Oil free acne wash      . traMADol (ULTRAM) 50 MG tablet Take 50 mg by mouth every 6 (six) hours as needed for moderate pain.       No current facility-administered  medications for this visit.     Past Medical History  Diagnosis Date  . Ischemic heart disease   . Obesity   . Chronic anxiety   . Lumbar herniated disc   . Depression   . Situational stress   . Hyperlipemia   . Hypothyroidism   . Coronary artery disease CARDIOLOGIST- DR Vidal Schwalbe-- VISIT 04-01-2011 W/ CHART AND IN EPIC  . Right ureteral calculus   . 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  NO ISCHEMIA. SUSPECT THIS REPRESENTS PRIOR MI BUT PATTERN UNUSUAL GIVEN WORSE DEFECT AT REST. APICAL HYPODINESIS BUT OVERALL PRESERVED EF.  Marland Kitchen History of hypercalcemia   . History of echocardiogram 05-20-2007    NORMAL LVSF. DISTAL SEPTAL HYPOKINESIA. MILD LEVH W/ IMPAIRED LV RELAXATION. MILD AORTIC SCLEROSIS. MILD TRICUSPID REGURGITATION  . Arthritis   . GERD (gastroesophageal reflux disease)   . PONV (postoperative nausea and vomiting)   . Panic attack as reaction to stress   . Female bladder prolapse   . Urge urinary incontinence   . Nephrolithiasis     "chronic" (03/07/2014)  . Family history of anesthesia complication     "sister had PONV" (03/07/2014)  . Pneumonia   . COPD (chronic obstructive pulmonary disease)   . 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 Type II diabetes mellitus   . Daily headache   . Migraine     "frequently; have had them since I was very young"  . PAD (peripheral artery disease)     Past Surgical History  Procedure Laterality Date  . Left ureteroscopic laser litho/ stone extraction  12-16-2009  &  11-17-2007  . Right ureteroscopic stone extraction   07-04-2008  . Carpal tunnel release Right 2005  . Extracorporeal shock wave lithotripsy  2007    X2  . Facial cosmetic surgery  ?2003; ?2007  . Ureteroscopy  01/25/2012    Procedure: URETEROSCOPY;  Surgeon: Claybon Jabs, MD;  Location: [facility];   Service: Urology;  Laterality: Right;  . Coronary angioplasty  1984  . Cardiac catheterization  04-18-2008; 05-08-2004;  11-29-1986; 08-19-1988    2009 REPORT---- MILD ANTERIOR HYPOKINESIS (FROM REMOTE MI), MILD CORONARY ATHEROSCLEROSIS W/ 30-40% NARROWING IN THE MID LAD & 50% NARROWING IN THE SMALL OBTUSE MARGINAL,  NORMAL RENAL ARTERIES W/ NO ABD. AORTIC DISEASE  . Iliac artery stent Bilateral 03/07/2014  . Tonsillectomy and adenoidectomy  1950's  . Cataract extraction w/ intraocular lens  implant, bilateral Bilateral 2013  . Dilation and curettage of uterus  History   Social History  . Marital Status: Married    Spouse Name: N/A    Number of Children: N/A  . Years of Education: N/A   Occupational History  . Not on file.   Social History Main Topics  . Smoking status: Former Smoker -- 1.00 packs/day for 17 years    Types: Cigarettes    Quit date: 12/28/1982  . Smokeless tobacco: Never Used  . Alcohol Use: Yes     Comment: 03/07/2014 "haven't had a drink in 3 yr now; never had problems w/it"  . Drug Use: No  . Sexual Activity: No   Other Topics Concern  . Not on file   Social History Narrative  . No narrative on file      Constitutional: He is oriented to person, place, and time. He appears well-developed and well-nourished. No distress.  HENT: No nasal discharge.  Head: Normocephalic and atraumatic.  Eyes: Pupils are equal and round.  No discharge. Neck: Normal range of motion. Neck supple. No JVD present. No thyromegaly present.  Cardiovascular: Normal rate, regular rhythm, normal heart sounds. Exam reveals no gallop and no friction rub. No murmur heard.  Pulmonary/Chest: Effort normal and breath sounds normal. No stridor. No respiratory distress. He has no wheezes. He has no rales. He exhibits no tenderness.  Abdominal: Soft. Bowel sounds are normal. He exhibits no distension. There is no tenderness. There is no rebound and no guarding.  Musculoskeletal: Normal  range of motion. He exhibits no edema and no tenderness.  Neurological: He is alert and oriented to person, place, and time. Coordination normal.  Skin: Skin is warm and dry. No rash noted. He is not diaphoretic. No erythema. No pallor.  Psychiatric: He has a normal mood and affect. His behavior is normal. Judgment and thought content normal.  Vascular: Femoral pulse: slightly diminished bilaterally. Dorsalis pedis and posterior tibial: +2 on the right side and +2 on the left side.

## 2014-06-12 NOTE — Assessment & Plan Note (Signed)
Stable overall.  

## 2014-06-12 NOTE — Assessment & Plan Note (Signed)
She is doing well after bilateral common iliac artery kissing stent placement. No claudication.  Check aortoiliac Duplex.  Follow up in 6 months.

## 2014-06-20 ENCOUNTER — Other Ambulatory Visit (HOSPITAL_COMMUNITY): Payer: Self-pay | Admitting: Cardiology

## 2014-06-20 DIAGNOSIS — L98499 Non-pressure chronic ulcer of skin of other sites with unspecified severity: Principal | ICD-10-CM

## 2014-06-20 DIAGNOSIS — I739 Peripheral vascular disease, unspecified: Secondary | ICD-10-CM

## 2014-06-25 ENCOUNTER — Other Ambulatory Visit (HOSPITAL_COMMUNITY): Payer: Self-pay | Admitting: Cardiology

## 2014-06-25 ENCOUNTER — Ambulatory Visit (HOSPITAL_BASED_OUTPATIENT_CLINIC_OR_DEPARTMENT_OTHER): Payer: Medicare Other | Admitting: Cardiology

## 2014-06-25 ENCOUNTER — Encounter (HOSPITAL_COMMUNITY): Payer: Medicare Other

## 2014-06-25 ENCOUNTER — Ambulatory Visit (HOSPITAL_COMMUNITY): Payer: Medicare Other | Attending: Internal Medicine | Admitting: Cardiology

## 2014-06-25 DIAGNOSIS — I739 Peripheral vascular disease, unspecified: Secondary | ICD-10-CM | POA: Insufficient documentation

## 2014-06-25 DIAGNOSIS — I359 Nonrheumatic aortic valve disorder, unspecified: Secondary | ICD-10-CM | POA: Insufficient documentation

## 2014-06-25 DIAGNOSIS — L98499 Non-pressure chronic ulcer of skin of other sites with unspecified severity: Secondary | ICD-10-CM | POA: Insufficient documentation

## 2014-06-25 DIAGNOSIS — I700 Atherosclerosis of aorta: Secondary | ICD-10-CM

## 2014-06-25 NOTE — Progress Notes (Signed)
ABI performed  

## 2014-06-25 NOTE — Progress Notes (Signed)
Aorto-iliac duplex performed 

## 2014-07-09 ENCOUNTER — Other Ambulatory Visit: Payer: Self-pay | Admitting: *Deleted

## 2014-07-09 MED ORDER — METOPROLOL SUCCINATE ER 25 MG PO TB24: 25.0000 mg | ORAL_TABLET | Freq: Every day | ORAL | Status: DC

## 2014-07-09 MED ORDER — DILTIAZEM HCL ER COATED BEADS 120 MG PO CP24: 120.0000 mg | ORAL_CAPSULE | Freq: Every day | ORAL | Status: DC

## 2014-07-09 MED ORDER — LOSARTAN POTASSIUM 25 MG PO TABS: 25.0000 mg | ORAL_TABLET | Freq: Every day | ORAL | Status: DC

## 2014-07-09 MED ORDER — ATORVASTATIN CALCIUM 40 MG PO TABS: 40.0000 mg | ORAL_TABLET | Freq: Every day | ORAL | Status: DC

## 2014-07-09 MED ORDER — EZETIMIBE 10 MG PO TABS: 10.0000 mg | ORAL_TABLET | Freq: Every day | ORAL | Status: DC

## 2014-07-24 ENCOUNTER — Encounter: Payer: Self-pay | Admitting: Cardiology

## 2014-08-07 ENCOUNTER — Other Ambulatory Visit (HOSPITAL_COMMUNITY): Payer: Self-pay | Admitting: *Deleted

## 2014-08-08 ENCOUNTER — Encounter (HOSPITAL_COMMUNITY)
Admission: RE | Admit: 2014-08-08 | Discharge: 2014-08-08 | Disposition: A | Discharge status: Home / Self Care | Payer: Medicare Other | Source: Ambulatory Visit | Attending: Internal Medicine | Admitting: Internal Medicine

## 2014-08-08 DIAGNOSIS — M810 Age-related osteoporosis without current pathological fracture: Secondary | ICD-10-CM | POA: Insufficient documentation

## 2014-08-08 MED ORDER — ZOLEDRONIC ACID 5 MG/100ML IV SOLN: 5.0000 mg | Freq: Once | INTRAVENOUS | Status: DC

## 2014-08-08 MED ORDER — ZOLEDRONIC ACID 5 MG/100ML IV SOLN
INTRAVENOUS | Status: AC
  Administered 2014-08-08: 5 mg
  Filled 2014-08-08: qty 100

## 2014-08-09 ENCOUNTER — Other Ambulatory Visit: Payer: Self-pay | Admitting: Internal Medicine

## 2014-08-10 ENCOUNTER — Ambulatory Visit (INDEPENDENT_AMBULATORY_CARE_PROVIDER_SITE_OTHER): Payer: Medicare Other | Admitting: Cardiology

## 2014-08-10 ENCOUNTER — Encounter: Payer: Self-pay | Admitting: Cardiology

## 2014-08-10 VITALS — BP 120/60 | HR 68 | Ht 69.0 in | Wt 194.0 lb

## 2014-08-10 DIAGNOSIS — I10 Essential (primary) hypertension: Secondary | ICD-10-CM

## 2014-08-10 DIAGNOSIS — I2510 Atherosclerotic heart disease of native coronary artery without angina pectoris: Secondary | ICD-10-CM

## 2014-08-10 DIAGNOSIS — E785 Hyperlipidemia, unspecified: Secondary | ICD-10-CM

## 2014-08-10 DIAGNOSIS — I739 Peripheral vascular disease, unspecified: Secondary | ICD-10-CM

## 2014-08-10 NOTE — Assessment & Plan Note (Signed)
Continue aspirin and statin. 

## 2014-08-10 NOTE — Patient Instructions (Signed)
Your physician wants you to follow-up in: ONE YEAR WITH DR CRENSHAW You will receive a reminder letter in the mail two months in advance. If you don't receive a letter, please call our office to schedule the follow-up appointment.  

## 2014-08-10 NOTE — Assessment & Plan Note (Signed)
Blood pressure controlled. Continue present medications. 

## 2014-08-10 NOTE — Progress Notes (Signed)
HPI: FU CAD, s/p anterior MI in 1984 treated with thrombolytics. Also with PVD. 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 She underwent kissing stent placement to the bilateral CIA extending into the distal aorta. Procedure was c/b by small non-flow limiting dissection involving L EIA and tiny dissection of distal aorta that was not flow limiting. These were treated medically. ABIs June 2015 normal. Since last seen, She is having problems with bronchitis and hypercalcemia which is being evaluated by primary care. She has chest pain when she coughs but no other symptoms. No syncope. Studies:  - Echo in 5/08: Normal LV function, distal septal HK, impaired LV relaxation, mild TR.  - Myoview 05/03/13: Small anteroapical infarct with moderate peri-infarct ischemia, EF 59%.  - LHC 05/11/13: Proximal LAD 30%, mid LAD 30%, EF 55-60%, apical inferior HK. Overall, her LAD was heavily calcified but she had no flow limiting stenosis. Abnormal nuclear study was felt to be related to prior anterior MI. Continued medical therapy recommended.  - Abdominal US (02/2014): No AAA   Current Outpatient Prescriptions  Medication Sig Dispense Refill  . acetaminophen (TYLENOL) 325 MG tablet Take 650 mg by mouth every 6 (six) hours as needed for mild pain.      Marland Kitchen albuterol (PROVENTIL HFA;VENTOLIN HFA) 108 (90 BASE) MCG/ACT inhaler Inhale 1 puff into the lungs every 6 (six) hours as needed for wheezing or shortness of breath.      . ALPRAZolam (XANAX) 0.5 MG tablet Take 0.5-1 mg by mouth 2 (two) times daily. Take 1 tablet in the morning and 2 tablets at night      . aspirin EC 81 MG tablet Take 81 mg by mouth daily.      Marland Kitchen atorvastatin (LIPITOR) 40 MG tablet Take 1 tablet (40 mg total) by mouth daily.  90 tablet  0  . celecoxib (CELEBREX) 200 MG capsule Take 200 mg by mouth daily as needed for moderate pain.        Marland Kitchen diltiazem (CARDIZEM CD) 120 MG 24 hr capsule Take 1 capsule (120 mg total) by mouth at bedtime.  90 capsule  0  . Ergocalciferol (VITAMIN D2 PO) Take 1 capsule by mouth every Monday.       . ezetimibe (ZETIA) 10 MG tablet Take 1 tablet (10 mg total) by mouth daily.  90 tablet  0  . FLUoxetine (PROZAC) 20 MG capsule Take 20 mg by mouth daily with breakfast.       . furosemide (LASIX) 20 MG tablet Take 20 mg by mouth daily as needed for fluid or edema.      Marland Kitchen HYDROcodone-homatropine (HYCODAN) 5-1.5 MG/5ML syrup 1 tsp prn      . ibuprofen (ADVIL,MOTRIN) 200 MG tablet Take 200 mg by mouth every 6 (six) hours as needed (joint pain and inflammation).       . isometheptene-acetaminophen-dichloralphenazone (MIDRIN) 65-325-100 MG capsule Take 1 capsule by mouth 4 (four) times daily as needed for migraine.       Marland Kitchen levothyroxine (SYNTHROID, LEVOTHROID) 150 MCG tablet Take 150 mcg by mouth every morning.       Marland Kitchen losartan (COZAAR) 25 MG tablet Take 1 tablet (25 mg total) by mouth daily.  90 tablet  0  . metFORMIN (GLUCOPHAGE) 1000 MG tablet Take 1 tablet (1,000 mg total) by mouth 2 (two) times daily.      . metoprolol succinate (  TOPROL-XL) 25 MG 24 hr tablet Take 1 tablet (25 mg total) by mouth daily.  90 tablet  0  . nitroGLYCERIN (NITROSTAT) 0.4 MG SL tablet Place 0.4 mg under the tongue every 5 (five) minutes as needed for chest pain.      Marland Kitchen omeprazole (PRILOSEC) 20 MG capsule Take 20 mg by mouth daily as needed (heart burn).       Marland Kitchen OVER THE COUNTER MEDICATION Apply 1 each topically 2 (two) times daily. Oil free acne wash      . traMADol (ULTRAM) 50 MG tablet Take 50 mg by mouth every 6 (six) hours as needed for moderate pain.       No current facility-administered medications for this visit.     Past Medical History  Diagnosis Date  . Ischemic heart disease   . Obesity   . Chronic anxiety   . Lumbar herniated disc   . Depression   . Situational stress   . Hyperlipemia   . Hypothyroidism    . Coronary artery disease CARDIOLOGIST- DR Vidal Schwalbe-- VISIT 04-01-2011 W/ CHART AND IN EPIC  . Right ureteral calculus   . 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  NO ISCHEMIA. SUSPECT THIS REPRESENTS PRIOR MI BUT PATTERN UNUSUAL GIVEN WORSE DEFECT AT REST. APICAL HYPODINESIS BUT OVERALL PRESERVED EF.  Marland Kitchen History of hypercalcemia   . History of echocardiogram 05-20-2007    NORMAL LVSF. DISTAL SEPTAL HYPOKINESIA. MILD LEVH W/ IMPAIRED LV RELAXATION. MILD AORTIC SCLEROSIS. MILD TRICUSPID REGURGITATION  . Arthritis   . GERD (gastroesophageal reflux disease)   . PONV (postoperative nausea and vomiting)   . Panic attack as reaction to stress   . Female bladder prolapse   . Urge urinary incontinence   . Nephrolithiasis     "chronic" (03/07/2014)  . Family history of anesthesia complication     "sister had PONV" (03/07/2014)  . Pneumonia   . COPD (chronic obstructive pulmonary disease)   . 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 Type II diabetes mellitus   . Daily headache   . Migraine     "frequently; have had them since I was very young"  . PAD (peripheral artery disease)     Past Surgical History  Procedure Laterality Date  . Left ureteroscopic laser litho/ stone extraction  12-16-2009  &  11-17-2007  . Right ureteroscopic stone extraction   07-04-2008  . Carpal tunnel release Right 2005  . Extracorporeal shock wave lithotripsy  2007    X2  . Facial cosmetic surgery  ?2003; ?2007  . Ureteroscopy  01/25/2012    Procedure: URETEROSCOPY;  Surgeon: Claybon Jabs, MD;  Location: [facility];  Service: Urology;  Laterality: Right;  . Coronary angioplasty  1984  . Cardiac catheterization  04-18-2008; 05-08-2004;  11-29-1986; 08-19-1988    2009 REPORT---- MILD ANTERIOR HYPOKINESIS (FROM REMOTE MI), MILD CORONARY ATHEROSCLEROSIS W/  30-40% NARROWING IN THE MID LAD & 50% NARROWING IN THE SMALL OBTUSE MARGINAL,  NORMAL RENAL ARTERIES W/ NO ABD. AORTIC DISEASE  . Iliac artery stent Bilateral 03/07/2014  . Tonsillectomy and adenoidectomy  1950's  . Cataract extraction w/ intraocular lens  implant, bilateral Bilateral 2013  . Dilation and curettage of uterus      History   Social History  . Marital Status: Married    Spouse Name: N/A    Number  of Children: N/A  . Years of Education: N/A   Occupational History  . Not on file.   Social History Main Topics  . Smoking status: Former Smoker -- 1.00 packs/day for 17 years    Types: Cigarettes    Quit date: 12/28/1982  . Smokeless tobacco: Never Used  . Alcohol Use: Yes     Comment: 03/07/2014 "haven't had a drink in 3 yr now; never had problems w/it"  . Drug Use: No  . Sexual Activity: No   Other Topics Concern  . Not on file   Social History Narrative  . No narrative on file    ROS: no fevers or chills, productive cough, hemoptysis, dysphasia, odynophagia, melena, hematochezia, dysuria, hematuria, rash, seizure activity, orthopnea, PND, pedal edema, claudication. Remaining systems are negative.  Physical Exam: Well-developed well-nourished in no acute distress.  Skin is warm and dry.  HEENT is normal.  Neck is supple.  Chest is clear to auscultation with normal expansion.  Cardiovascular exam is regular rate and rhythm.  Abdominal exam nontender or distended. No masses palpated. Extremities show no edema. neuro grossly intact  ECG Sinus rhythm at a rate of 68. Nonspecific ST changes.

## 2014-08-10 NOTE — Assessment & Plan Note (Signed)
Continue statin. 

## 2014-08-14 ENCOUNTER — Ambulatory Visit
Admission: RE | Admit: 2014-08-14 | Discharge: 2014-08-14 | Disposition: A | Discharge status: Home / Self Care | Payer: Medicare Other | Source: Ambulatory Visit | Attending: Internal Medicine | Admitting: Internal Medicine

## 2014-08-14 MED ORDER — IOHEXOL 300 MG/ML  SOLN
125.0000 mL | Freq: Once | INTRAMUSCULAR | Status: AC | PRN
  Administered 2014-08-14: 125 mL via INTRAVENOUS

## 2014-08-16 ENCOUNTER — Other Ambulatory Visit (HOSPITAL_COMMUNITY): Payer: Self-pay | Admitting: Internal Medicine

## 2014-09-26 ENCOUNTER — Encounter (HOSPITAL_COMMUNITY): Admission: RE | Admit: 2014-09-26 | Payer: Medicare Other | Source: Ambulatory Visit

## 2014-09-26 ENCOUNTER — Encounter (HOSPITAL_COMMUNITY): Payer: Medicare Other

## 2014-10-02 ENCOUNTER — Telehealth: Payer: Self-pay | Admitting: Cardiovascular Disease

## 2014-10-02 NOTE — Telephone Encounter (Signed)
Closed encounter °

## 2014-10-13 ENCOUNTER — Other Ambulatory Visit: Payer: Self-pay

## 2014-10-13 MED ORDER — EZETIMIBE 10 MG PO TABS: 10.0000 mg | ORAL_TABLET | Freq: Every day | ORAL | Status: DC

## 2014-10-13 MED ORDER — ATORVASTATIN CALCIUM 40 MG PO TABS: 40.0000 mg | ORAL_TABLET | Freq: Every day | ORAL | Status: DC

## 2014-10-13 MED ORDER — DILTIAZEM HCL ER COATED BEADS 120 MG PO CP24: 120.0000 mg | ORAL_CAPSULE | Freq: Every day | ORAL | Status: DC

## 2014-10-13 MED ORDER — LOSARTAN POTASSIUM 25 MG PO TABS: 25.0000 mg | ORAL_TABLET | Freq: Every day | ORAL | Status: DC

## 2014-10-15 ENCOUNTER — Other Ambulatory Visit: Payer: Self-pay

## 2014-10-15 MED ORDER — METOPROLOL SUCCINATE ER 25 MG PO TB24: 25.0000 mg | ORAL_TABLET | Freq: Every day | ORAL | Status: DC

## 2014-10-22 ENCOUNTER — Other Ambulatory Visit: Payer: Self-pay

## 2014-10-22 MED ORDER — METOPROLOL SUCCINATE ER 25 MG PO TB24: 25.0000 mg | ORAL_TABLET | Freq: Every day | ORAL | Status: DC

## 2014-12-06 ENCOUNTER — Encounter (HOSPITAL_COMMUNITY): Payer: Self-pay | Admitting: Cardiovascular Disease

## 2014-12-11 ENCOUNTER — Ambulatory Visit (INDEPENDENT_AMBULATORY_CARE_PROVIDER_SITE_OTHER): Payer: Medicare Other | Admitting: Cardiovascular Disease

## 2014-12-11 ENCOUNTER — Encounter: Payer: Self-pay | Admitting: Cardiovascular Disease

## 2014-12-11 VITALS — BP 120/68 | HR 68 | Ht 69.0 in | Wt 182.1 lb

## 2014-12-11 DIAGNOSIS — E785 Hyperlipidemia, unspecified: Secondary | ICD-10-CM

## 2014-12-11 DIAGNOSIS — R079 Chest pain, unspecified: Secondary | ICD-10-CM

## 2014-12-11 DIAGNOSIS — I739 Peripheral vascular disease, unspecified: Secondary | ICD-10-CM

## 2014-12-11 DIAGNOSIS — I25118 Atherosclerotic heart disease of native coronary artery with other forms of angina pectoris: Secondary | ICD-10-CM

## 2014-12-11 DIAGNOSIS — I10 Essential (primary) hypertension: Secondary | ICD-10-CM

## 2014-12-11 MED ORDER — PANTOPRAZOLE SODIUM 40 MG PO TBEC: 40.0000 mg | DELAYED_RELEASE_TABLET | Freq: Every day | ORAL | Status: DC

## 2014-12-11 NOTE — Patient Instructions (Signed)
Your physician has requested that you have a lexiscan myoview. For further information please visit [email or web address]. Please follow instruction sheet, as given.  Your physician has recommended you make the following change in your medication:  1) Start protonix 40 mg once daily  Your physician wants you to follow-up in: 6 months with Dr. Fletcher Anon. You will receive a reminder letter in the mail two months in advance. If you don't receive a letter, please call our office to schedule the follow-up appointment.

## 2014-12-11 NOTE — Progress Notes (Signed)
Primary care physician: Dr. Marton Redwood Primary cardiologist: Dr. Stanford Breed   HPI: This is a 68 year old female who is here for a follow up visit regarding  peripheral arterial disease. She has known history of coronary artery disease  s/p anterior MI in 1984 treated with thrombolytics and POBA. Last echo in 5/08: Normal LV function, distal septal HK, impaired LV relaxation, mild TR. Myoview 05/03/13: Small anteroapical infarct with moderate peri-infarct ischemia, EF 59%. LHC 05/11/13: Proximal LAD 30%, mid LAD 30%, EF 55-60%, apical inferior HK. Overall, her LAD was heavily calcified but she had no flow limiting stenosis.  She was seen in March, 2015 for  progressive buttock and thigh claudication . She had cardiovascular  screening testing in November which showed mild to moderate bilateral carotid disease. No abdominal aortic aneurysm. ABI on the right 0.65. Left normal. lower extremity arterial duplex showed no significant infrainguinal disease but there was monophasic waveform in the right common femoral artery and below suggestive of significant inflow disease.  I proceeded with angiography which showed severe right common iliac artery stenosis and borderline left common iliac artery stenosis with no infrainguinal disease. I performed bilateral kissing stent placement on common iliac arteries. Post procedure ABI was normal.  No claudication. She has chronic muscle aches in upper and lower extremities. She can walk one hour without having to stop. She reports left-sided chest pain and left arm discomfort. This usually happens when she is anxious and stressed but not with physical activities. She has no exertional symptoms. She does complain of increased heartburn.   Current Outpatient Prescriptions  Medication Sig Dispense Refill  . acetaminophen (TYLENOL) 325 MG tablet Take 650 mg by mouth every 6 (six) hours as needed for mild pain.    Marland Kitchen ALPRAZolam (XANAX) 0.5 MG tablet Take 0.5-1 mg by mouth 2  (two) times daily. Take 1 tablet in the morning and 2 tablets at night    . aspirin EC 81 MG tablet Take 81 mg by mouth daily.    Marland Kitchen atorvastatin (LIPITOR) 40 MG tablet Take 1 tablet (40 mg total) by mouth daily. 90 tablet 1  . celecoxib (CELEBREX) 200 MG capsule Take 200 mg by mouth daily as needed for moderate pain.    Marland Kitchen diltiazem (CARDIZEM CD) 120 MG 24 hr capsule Take 1 capsule (120 mg total) by mouth at bedtime. 90 capsule 3  . Ergocalciferol (VITAMIN D2 PO) Take 1 capsule by mouth every Monday.     . ezetimibe (ZETIA) 10 MG tablet Take 1 tablet (10 mg total) by mouth daily. 90 tablet 3  . FLUoxetine (PROZAC) 20 MG capsule Take 20 mg by mouth daily with breakfast.     . furosemide (LASIX) 20 MG tablet Take 20 mg by mouth daily as needed for fluid or edema.    Marland Kitchen ibuprofen (ADVIL,MOTRIN) 200 MG tablet Take 200 mg by mouth every 6 (six) hours as needed (joint pain and inflammation).     . isometheptene-acetaminophen-dichloralphenazone (MIDRIN) 65-325-100 MG capsule Take 1 capsule by mouth 4 (four) times daily as needed for migraine.     Marland Kitchen levothyroxine (SYNTHROID, LEVOTHROID) 150 MCG tablet Take 150 mcg by mouth every morning.     Marland Kitchen losartan (COZAAR) 25 MG tablet Take 1 tablet (25 mg total) by mouth daily. 90 tablet 3  . metFORMIN (GLUCOPHAGE) 1000 MG tablet Take 1 tablet (1,000 mg total) by mouth 2 (two) times daily.    . metoprolol succinate (TOPROL-XL) 25 MG 24 hr tablet Take 1 tablet (  25 mg total) by mouth daily. 90 tablet 3  . nitroGLYCERIN (NITROSTAT) 0.4 MG SL tablet Place 0.4 mg under the tongue every 5 (five) minutes as needed for chest pain.    Marland Kitchen omeprazole (PRILOSEC) 20 MG capsule Take 20 mg by mouth daily as needed (heart burn).     Marland Kitchen OVER THE COUNTER MEDICATION Apply 1 each topically 2 (two) times daily. Oil free acne wash    . traMADol (ULTRAM) 50 MG tablet Take 50 mg by mouth every 6 (six) hours as needed for moderate pain.    Marland Kitchen albuterol (PROVENTIL HFA;VENTOLIN HFA) 108 (90 BASE)  MCG/ACT inhaler Inhale 1 puff into the lungs every 6 (six) hours as needed for wheezing or shortness of breath.     No current facility-administered medications for this visit.     Past Medical History  Diagnosis Date  . Ischemic heart disease   . Obesity   . Chronic anxiety   . Lumbar herniated disc   . Depression   . Situational stress   . Hyperlipemia   . Hypothyroidism   . Coronary artery disease CARDIOLOGIST- DR Vidal Schwalbe-- VISIT 04-01-2011 W/ CHART AND IN EPIC  . Right ureteral calculus   . 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  NO ISCHEMIA. SUSPECT THIS REPRESENTS PRIOR MI BUT PATTERN UNUSUAL GIVEN WORSE DEFECT AT REST. APICAL HYPODINESIS BUT OVERALL PRESERVED EF.  Marland Kitchen History of hypercalcemia   . History of echocardiogram 05-20-2007    NORMAL LVSF. DISTAL SEPTAL HYPOKINESIA. MILD LEVH W/ IMPAIRED LV RELAXATION. MILD AORTIC SCLEROSIS. MILD TRICUSPID REGURGITATION  . Arthritis   . GERD (gastroesophageal reflux disease)   . PONV (postoperative nausea and vomiting)   . Panic attack as reaction to stress   . Female bladder prolapse   . Urge urinary incontinence   . Nephrolithiasis     "chronic" (03/07/2014)  . Family history of anesthesia complication     "sister had PONV" (03/07/2014)  . Pneumonia   . COPD (chronic obstructive pulmonary disease)   . 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 Type II diabetes mellitus   . Daily headache   . Migraine     "frequently; have had them since I was very young"  . PAD (peripheral artery disease)     Past Surgical History  Procedure Laterality Date  . Left ureteroscopic laser litho/ stone extraction  12-16-2009  &  11-17-2007  . Right ureteroscopic stone extraction   07-04-2008  . Carpal tunnel release Right 2005  . Extracorporeal shock wave lithotripsy  2007    X2  . Facial cosmetic  surgery  ?2003; ?2007  . Ureteroscopy  01/25/2012    Procedure: URETEROSCOPY;  Surgeon: Claybon Jabs, MD;  Location: [facility];  Service: Urology;  Laterality: Right;  . Coronary angioplasty  1984  . Cardiac catheterization  04-18-2008; 05-08-2004;  11-29-1986; 08-19-1988    2009 REPORT---- MILD ANTERIOR HYPOKINESIS (FROM REMOTE MI), MILD CORONARY ATHEROSCLEROSIS W/ 30-40% NARROWING IN THE MID LAD & 50% NARROWING IN THE SMALL OBTUSE MARGINAL,  NORMAL RENAL ARTERIES W/ NO ABD. AORTIC DISEASE  . Iliac artery stent Bilateral 03/07/2014  . Tonsillectomy and adenoidectomy  1950's  . Cataract extraction w/ intraocular lens  implant, bilateral Bilateral 2013  . Dilation and curettage of uterus    . Abdominal angiogram N/A 03/07/2014    Procedure: ABDOMINAL  ANGIOGRAM;  Surgeon: Wellington Hampshire, MD;  Location: [facility] C[facility] LAB;  Service: Cardiovascular;  Laterality: N/A;  . Percutaneous stent intervention Bilateral 03/07/2014    Procedure: PERCUTANEOUS STENT INTERVENTION;  Surgeon: Wellington Hampshire, MD;  Location: [facility] CATH LAB;  Service: Cardiovascular;  Laterality: Bilateral;  bilat iliac stents  . Lower extremity angiogram Bilateral 03/07/2014    Procedure: LOWER EXTREMITY ANGIOGRAM;  Surgeon: Wellington Hampshire, MD;  Location: [place] CATH LAB;  Service: Cardiovascular;  Laterality: Bilateral;    History   Social History  . Marital Status: Married    Spouse Name: N/A    Number of Children: N/A  . Years of Education: N/A   Occupational History  . Not on file.   Social History Main Topics  . Smoking status: Former Smoker -- 1.00 packs/day for 17 years    Types: Cigarettes    Quit date: 12/28/1982  . Smokeless tobacco: Never Used  . Alcohol Use: Yes     Comment: 03/07/2014 "haven't had a drink in 3 yr now; never had problems w/it"  . Drug Use: No  . Sexual Activity: No   Other Topics Concern  . Not on file   Social History Narrative      Constitutional: He is oriented to person,  place, and time. He appears well-developed and well-nourished. No distress.  HENT: No nasal discharge.  Head: Normocephalic and atraumatic.  Eyes: Pupils are equal and round.  No discharge. Neck: Normal range of motion. Neck supple. No JVD present. No thyromegaly present.  Cardiovascular: Normal rate, regular rhythm, normal heart sounds. Exam reveals no gallop and no friction rub. No murmur heard.  Pulmonary/Chest: Effort normal and breath sounds normal. No stridor. No respiratory distress. He has no wheezes. He has no rales. He exhibits no tenderness.  Abdominal: Soft. Bowel sounds are normal. He exhibits no distension. There is no tenderness. There is no rebound and no guarding.  Musculoskeletal: Normal range of motion. He exhibits no edema and no tenderness.  Neurological: He is alert and oriented to person, place, and time. Coordination normal.  Skin: Skin is warm and dry. No rash noted. He is not diaphoretic. No erythema. No pallor.  Psychiatric: He has a normal mood and affect. His behavior is normal. Judgment and thought content normal.  Vascular: Femoral pulse: slightly diminished bilaterally. Dorsalis pedis and posterior tibial: +2 on the right side and +2 on the left side.

## 2014-12-12 ENCOUNTER — Other Ambulatory Visit (HOSPITAL_COMMUNITY): Payer: Self-pay | Admitting: *Deleted

## 2014-12-12 DIAGNOSIS — I739 Peripheral vascular disease, unspecified: Secondary | ICD-10-CM

## 2014-12-13 ENCOUNTER — Encounter: Payer: Self-pay | Admitting: Cardiovascular Disease

## 2014-12-13 NOTE — Assessment & Plan Note (Signed)
Blood pressure is controlled on current medications. 

## 2014-12-13 NOTE — Assessment & Plan Note (Signed)
She is currently on atorvastatin and Zetia. Recommend a target LDL of less than 70.

## 2014-12-13 NOTE — Assessment & Plan Note (Signed)
She has atypical leg discomfort that does not seem to be claudication. She is due for an aortoiliac duplex and ABIs.

## 2014-12-13 NOTE — Assessment & Plan Note (Signed)
She has known history of coronary artery disease and currently with atypical chest pain. She seems to be extremely anxious. I requested a pharmacologic nuclear stress test for evaluation. I started her on Protonix 40 mg once daily given that some of her symptoms seems to be due to GERD.

## 2014-12-14 ENCOUNTER — Ambulatory Visit (HOSPITAL_COMMUNITY): Payer: Medicare Other | Attending: Cardiovascular Disease | Admitting: Radiology

## 2014-12-14 DIAGNOSIS — R079 Chest pain, unspecified: Secondary | ICD-10-CM | POA: Diagnosis present

## 2014-12-14 DIAGNOSIS — I25118 Atherosclerotic heart disease of native coronary artery with other forms of angina pectoris: Secondary | ICD-10-CM

## 2014-12-14 MED ORDER — TECHNETIUM TC 99M SESTAMIBI GENERIC - CARDIOLITE
11.0000 | Freq: Once | INTRAVENOUS | Status: AC | PRN
  Administered 2014-12-14: 11 via INTRAVENOUS

## 2014-12-14 MED ORDER — TECHNETIUM TC 99M SESTAMIBI GENERIC - CARDIOLITE
33.0000 | Freq: Once | INTRAVENOUS | Status: AC | PRN
  Administered 2014-12-14: 33 via INTRAVENOUS

## 2014-12-14 MED ORDER — REGADENOSON 0.4 MG/5ML IV SOLN
0.4000 mg | Freq: Once | INTRAVENOUS | Status: AC
  Administered 2014-12-14: 0.4 mg via INTRAVENOUS

## 2014-12-14 NOTE — Progress Notes (Signed)
[facility] [address] [place], [place] [address] [phone]    Cardiology Nuclear Med Study  [name] [name] is a 68 y.o. female     MRN : [id number]     DOB: 1946-08-08  Procedure Date: 12/14/2014  Nuclear Med Background Indication for Stress Test:  Evaluation for Ischemia History:  COPD;CAD;Previous Nuclear Study 14', Ischemia,Scar EF:59%(sm.anteroapical infart with mod.peri infarct ischemia)HTN.PAD iliac stents Cardiac Risk Factors: Carotid Disease, Hypertension, NIDDM and PAD ilicac stents  Symptoms:  Chest Pain and Chest Pressure.  (last date of chest discomfort 3 weeks ago)   Nuclear Pre-Procedure Caffeine/Decaff Intake:  None NPO After: 6:00 pm   Lungs:  clear O2 Sat: 95% on room air. IV 0.9% NS with Angio Cath:  22g  IV Site: R Antecubital  IV Started by:  Ileene Hutchinson, EMT-P  Chest Size (in):  42 Cup Size: C  Height: 5\' 9"  (1.753 m)  Weight:  182 lb (82.555 kg)  BMI:  Body mass index is 26.86 kg/(m^2). Tech Comments:  Metformin held this am    Nuclear Med Study 1 or 2 day study: 1 day  Stress Test Type:  Lexiscan  Reading MD: n/a  Order Authorizing Provider:  Kirk Ruths, MD  Resting Radionuclide: Technetium 53m Sestamibi  Resting Radionuclide Dose: 11.0 mCi   Stress Radionuclide:  Technetium 36m Sestamibi  Stress Radionuclide Dose: 33.0 mCi           Stress Protocol Rest HR: 54 Stress HR: 86  Rest BP: 134/82 Stress BP: 127/88  Exercise Time (min): n/a METS: n/a   Predicted Max HR: 151 bpm % Max HR: 56.95 bpm Rate Pressure Product: 11782   Dose of Adenosine (mg):  n/a Dose of Lexiscan: 0.4 mg  Dose of Atropine (mg): n/a Dose of Dobutamine: n/a mcg/kg/min (at max HR)  Stress Test Technologist: Glade Lloyd, BS-ES  Nuclear Technologist:  Earl Many, CNMT     Rest Procedure:  Myocardial perfusion imaging was performed at rest 45 minutes following the intravenous administration of Technetium 33m Sestamibi. Rest  ECG: NSR with non-specific ST-T wave changes  Stress Procedure:  The patient received IV Lexiscan 0.4 mg over 15-seconds.  Technetium 59m Sestamibi injected at 30-seconds.  Quantitative spect images were obtained after a 45 minute delay.  During the infusion of Lexiscan the patient complained of SOB and stomach upset.  Symptoms began to resolve in recovery.  Stress ECG: No significant change from baseline ECG  QPS Raw Data Images:  Normal; no motion artifact; normal heart/lung ratio. Stress Images:  There is decreased uptake in the apex and anteroapical wall Rest Images:  There is decreased uptake in the apex and anteroapical wall Subtraction (SDS):  There is patchy partial reversibility of the apex and anteroapical wall.  SDS 7 Transient Ischemic Dilatation (Normal <1.22):  1.09 Lung/Heart Ratio (Normal <0.45):  0.30  Quantitative Gated Spect Images QGS EDV:  85 ml QGS ESV:  35 ml  Impression Exercise Capacity:  Lexiscan with no exercise. BP Response:  Normal blood pressure response. Clinical Symptoms:  There is dyspnea. ECG Impression:  No significant ST segment change suggestive of ischemia. Comparison with Prior Nuclear Study: When compared with prior study of 2014, the degree of apical and anteroapical hypoperfusion has increased.  Overall Impression:  Intermediate risk stress nuclear study. There is a large area of patchy scar involving the apex, apical septal, apical anterior, and apical lateral segments, with partial reversibility. Overall LV systolic function is  normal with EF 58% (was 59% at last study 2014); however there is apical hypokinesis (present in 2014 also).  LV Ejection Fraction: 58%.  LV Wall Motion:  There is apical hypokinesis.  Darlin Coco MD

## 2014-12-25 ENCOUNTER — Ambulatory Visit (HOSPITAL_BASED_OUTPATIENT_CLINIC_OR_DEPARTMENT_OTHER): Payer: Medicare Other | Admitting: Cardiology

## 2014-12-25 ENCOUNTER — Ambulatory Visit (HOSPITAL_COMMUNITY): Payer: Medicare Other | Attending: Cardiovascular Disease | Admitting: Cardiology

## 2014-12-25 ENCOUNTER — Ambulatory Visit (HOSPITAL_COMMUNITY): Payer: Medicare Other

## 2014-12-25 DIAGNOSIS — I2510 Atherosclerotic heart disease of native coronary artery without angina pectoris: Secondary | ICD-10-CM | POA: Diagnosis not present

## 2014-12-25 DIAGNOSIS — I700 Atherosclerosis of aorta: Secondary | ICD-10-CM | POA: Diagnosis not present

## 2014-12-25 DIAGNOSIS — E119 Type 2 diabetes mellitus without complications: Secondary | ICD-10-CM | POA: Insufficient documentation

## 2014-12-25 DIAGNOSIS — E785 Hyperlipidemia, unspecified: Secondary | ICD-10-CM | POA: Diagnosis not present

## 2014-12-25 DIAGNOSIS — I10 Essential (primary) hypertension: Secondary | ICD-10-CM | POA: Insufficient documentation

## 2014-12-25 DIAGNOSIS — I708 Atherosclerosis of other arteries: Secondary | ICD-10-CM | POA: Insufficient documentation

## 2014-12-25 DIAGNOSIS — Z87891 Personal history of nicotine dependence: Secondary | ICD-10-CM | POA: Insufficient documentation

## 2014-12-25 DIAGNOSIS — Z4589 Encounter for adjustment and management of other implanted devices: Secondary | ICD-10-CM | POA: Insufficient documentation

## 2014-12-25 DIAGNOSIS — I739 Peripheral vascular disease, unspecified: Secondary | ICD-10-CM

## 2014-12-25 DIAGNOSIS — Z95828 Presence of other vascular implants and grafts: Secondary | ICD-10-CM | POA: Diagnosis not present

## 2014-12-25 NOTE — Progress Notes (Signed)
Abdominal Aorta Duplex performed  

## 2014-12-25 NOTE — Progress Notes (Signed)
ABI performed  

## 2015-02-06 ENCOUNTER — Encounter: Payer: Self-pay | Admitting: Cardiology

## 2015-04-02 ENCOUNTER — Telehealth: Payer: Self-pay | Admitting: Cardiovascular Disease

## 2015-04-02 DIAGNOSIS — I739 Peripheral vascular disease, unspecified: Secondary | ICD-10-CM

## 2015-04-02 NOTE — Telephone Encounter (Signed)
New message  Pt called to sch appt with Dr. Fletcher Anon. He has a note for the pt to have an aorto-iliac duplex prior to appt but we need an order. Please assist.

## 2015-04-02 NOTE — Telephone Encounter (Signed)
Pt scheduled for aorto-iliac duplex on 06/10/15.

## 2015-04-10 ENCOUNTER — Other Ambulatory Visit: Payer: Self-pay | Admitting: Cardiology

## 2015-04-25 ENCOUNTER — Other Ambulatory Visit: Payer: Self-pay | Admitting: Internal Medicine

## 2015-04-25 DIAGNOSIS — M549 Dorsalgia, unspecified: Secondary | ICD-10-CM

## 2015-05-08 ENCOUNTER — Ambulatory Visit
Admission: RE | Admit: 2015-05-08 | Discharge: 2015-05-08 | Disposition: A | Discharge status: Home / Self Care | Payer: Medicare Other | Source: Ambulatory Visit | Attending: Internal Medicine | Admitting: Internal Medicine

## 2015-05-08 DIAGNOSIS — M549 Dorsalgia, unspecified: Secondary | ICD-10-CM

## 2015-06-03 ENCOUNTER — Other Ambulatory Visit: Payer: Self-pay | Admitting: Cardiovascular Disease

## 2015-06-03 DIAGNOSIS — I739 Peripheral vascular disease, unspecified: Secondary | ICD-10-CM

## 2015-06-10 ENCOUNTER — Ambulatory Visit (HOSPITAL_BASED_OUTPATIENT_CLINIC_OR_DEPARTMENT_OTHER): Payer: Medicare Other

## 2015-06-10 ENCOUNTER — Ambulatory Visit (HOSPITAL_COMMUNITY): Payer: Medicare Other | Attending: Cardiology

## 2015-06-10 DIAGNOSIS — I739 Peripheral vascular disease, unspecified: Secondary | ICD-10-CM

## 2015-06-10 DIAGNOSIS — I708 Atherosclerosis of other arteries: Secondary | ICD-10-CM | POA: Insufficient documentation

## 2015-06-18 ENCOUNTER — Ambulatory Visit (INDEPENDENT_AMBULATORY_CARE_PROVIDER_SITE_OTHER): Payer: Medicare Other | Admitting: Cardiovascular Disease

## 2015-06-18 ENCOUNTER — Encounter: Payer: Self-pay | Admitting: Cardiovascular Disease

## 2015-06-18 VITALS — BP 116/82 | HR 58 | Ht 69.0 in | Wt 174.0 lb

## 2015-06-18 DIAGNOSIS — I10 Essential (primary) hypertension: Secondary | ICD-10-CM

## 2015-06-18 DIAGNOSIS — I739 Peripheral vascular disease, unspecified: Secondary | ICD-10-CM

## 2015-06-18 NOTE — Patient Instructions (Signed)
Medication Instructions:  Your physician recommends that you continue on your current medications as directed. Please refer to the Current Medication list given to you today.  Labwork: No new orders.   Testing/Procedures: Your physician has requested that you have an ankle brachial index (ABI) in 1 YEAR. During this test an ultrasound and blood pressure cuff are used to evaluate the arteries that supply the arms and legs with blood. Allow thirty minutes for this exam. There are no restrictions or special instructions.  Your physician has requested that you have an aorto-iliac duplex in 1 YEAR. Do not eat after midnight the day before and avoid carbonated beverages  Follow-Up: Your physician wants you to follow-up in: 1 YEAR with Dr Fletcher Anon.  You will receive a reminder letter in the mail two months in advance. If you don't receive a letter, please call our office to schedule the follow-up appointment.   Any Other Special Instructions Will Be Listed Below (If Applicable).

## 2015-06-18 NOTE — Assessment & Plan Note (Signed)
Blood pressure is well controlled on current medications. 

## 2015-06-18 NOTE — Progress Notes (Signed)
Primary care physician: Dr. Marton Redwood Primary cardiologist: Dr. Stanford Breed   HPI: This is a 69 year old female who is here for a follow up visit regarding  peripheral arterial disease. She has known history of coronary artery disease  s/p anterior MI in 1984 treated with thrombolytics and POBA. Last echo in 5/08: Normal LV function, distal septal HK, impaired LV relaxation, mild TR. Myoview 05/03/13: Small anteroapical infarct with moderate peri-infarct ischemia, EF 59%. LHC 05/11/13: Proximal LAD 30%, mid LAD 30%, EF 55-60%, apical inferior HK. Overall, her LAD was heavily calcified but she had no flow limiting stenosis.  She was seen in March, 2015 for  progressive buttock and thigh claudication . She had cardiovascular  screening testing in November which showed mild to moderate bilateral carotid disease. No abdominal aortic aneurysm. ABI on the right 0.65. Left normal. lower extremity arterial duplex showed no significant infrainguinal disease but there was monophasic waveform in the right common femoral artery and below suggestive of significant inflow disease.  I proceeded with angiography which showed severe right common iliac artery stenosis and borderline left common iliac artery stenosis with no infrainguinal disease. I performed bilateral kissing stent placement on common iliac arteries. Post procedure ABI was normal.  She had atypical chest pain in December. Nuclear stress test showed fixed anterior apical defect with partial reversibility and ejection fraction of 58%. This was unchanged from prior stress test in 2014. Noninvasive vascular evaluation this month showed normal ABI and moderate bilateral in-stent restenosis. She denies claudication.  Current Outpatient Prescriptions  Medication Sig Dispense Refill  . acetaminophen (TYLENOL) 325 MG tablet Take 650 mg by mouth every 6 (six) hours as needed for mild pain.    Marland Kitchen albuterol (PROVENTIL HFA;VENTOLIN HFA) 108 (90 BASE) MCG/ACT inhaler  Inhale 1 puff into the lungs every 6 (six) hours as needed for wheezing or shortness of breath.    . ALPRAZolam (XANAX) 0.5 MG tablet Take 0.5-1 mg by mouth 2 (two) times daily. Take 1 tablet in the morning and 2 tablets at night    . aspirin EC 81 MG tablet Take 81 mg by mouth daily.    Marland Kitchen atorvastatin (LIPITOR) 40 MG tablet TAKE 1 TABLET BY MOUTH EVERY DAY 90 tablet 1  . celecoxib (CELEBREX) 200 MG capsule Take 200 mg by mouth daily as needed for moderate pain.    Marland Kitchen diltiazem (CARDIZEM CD) 120 MG 24 hr capsule Take 1 capsule (120 mg total) by mouth at bedtime. 90 capsule 3  . ezetimibe (ZETIA) 10 MG tablet Take 1 tablet (10 mg total) by mouth daily. 90 tablet 3  . FLUoxetine (PROZAC) 20 MG capsule Take 20 mg by mouth daily with breakfast.     . furosemide (LASIX) 20 MG tablet Take 20 mg by mouth daily as needed for fluid or edema.    Marland Kitchen ibuprofen (ADVIL,MOTRIN) 200 MG tablet Take 200 mg by mouth every 6 (six) hours as needed (joint pain and inflammation).     . isometheptene-acetaminophen-dichloralphenazone (MIDRIN) 65-325-100 MG capsule Take 1 capsule by mouth 4 (four) times daily as needed for migraine.     Marland Kitchen losartan (COZAAR) 25 MG tablet Take 1 tablet (25 mg total) by mouth daily. 90 tablet 3  . metFORMIN (GLUCOPHAGE) 1000 MG tablet Take 1 tablet (1,000 mg total) by mouth 2 (two) times daily.    . metoprolol succinate (TOPROL-XL) 25 MG 24 hr tablet Take 1 tablet (25 mg total) by mouth daily. 90 tablet 3  . nitroGLYCERIN (NITROSTAT)  0.4 MG SL tablet Place 0.4 mg under the tongue every 5 (five) minutes as needed for chest pain.    Marland Kitchen omeprazole (PRILOSEC) 20 MG capsule Take 20 mg by mouth daily as needed (heart burn).     Marland Kitchen OVER THE COUNTER MEDICATION Apply 1 each topically 2 (two) times daily. Oil free acne wash    . oxyCODONE-acetaminophen (PERCOCET) 7.5-325 MG per tablet Take 1 tablet by mouth every 6 (six) hours as needed.  0  . pantoprazole (PROTONIX) 40 MG tablet Take 1 tablet (40 mg total)  by mouth daily. 30 tablet 6  . SYNTHROID 125 MCG tablet Take 125 mcg by mouth daily.  3  . traMADol (ULTRAM) 50 MG tablet Take 50 mg by mouth every 6 (six) hours as needed for moderate pain.    . Vitamin D, Ergocalciferol, (DRISDOL) 50000 UNITS CAPS capsule Take 50,000 Units by mouth once a week.  2   No current facility-administered medications for this visit.     Past Medical History  Diagnosis Date  . Ischemic heart disease   . Obesity   . Chronic anxiety   . Lumbar herniated disc   . Depression   . Situational stress   . Hyperlipemia   . Hypothyroidism   . Coronary artery disease CARDIOLOGIST- DR Vidal Schwalbe-- VISIT 04-01-2011 W/ CHART AND IN EPIC  . Right ureteral calculus   . 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  NO ISCHEMIA. SUSPECT THIS REPRESENTS PRIOR MI BUT PATTERN UNUSUAL GIVEN WORSE DEFECT AT REST. APICAL HYPODINESIS BUT OVERALL PRESERVED EF.  Marland Kitchen History of hypercalcemia   . History of echocardiogram 05-20-2007    NORMAL LVSF. DISTAL SEPTAL HYPOKINESIA. MILD LEVH W/ IMPAIRED LV RELAXATION. MILD AORTIC SCLEROSIS. MILD TRICUSPID REGURGITATION  . Arthritis   . GERD (gastroesophageal reflux disease)   . PONV (postoperative nausea and vomiting)   . Panic attack as reaction to stress   . Female bladder prolapse   . Urge urinary incontinence   . Nephrolithiasis     "chronic" (03/07/2014)  . Family history of anesthesia complication     "sister had PONV" (03/07/2014)  . Pneumonia   . COPD (chronic obstructive pulmonary disease)   . 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 Type II diabetes mellitus   . Daily headache   . Migraine     "frequently; have had them since I was very young"  . PAD (peripheral artery disease)     Past Surgical History  Procedure Laterality Date  . Left ureteroscopic laser litho/ stone extraction   12-16-2009  &  11-17-2007  . Right ureteroscopic stone extraction   07-04-2008  . Carpal tunnel release Right 2005  . Extracorporeal shock wave lithotripsy  2007    X2  . Facial cosmetic surgery  ?2003; ?2007  . Ureteroscopy  01/25/2012    Procedure: URETEROSCOPY;  Surgeon: Claybon Jabs, MD;  Location: [facility];  Service: Urology;  Laterality: Right;  . Coronary angioplasty  1984  . Cardiac catheterization  04-18-2008; 05-08-2004;  11-29-1986; 08-19-1988    2009 REPORT---- MILD ANTERIOR HYPOKINESIS (FROM REMOTE MI), MILD CORONARY ATHEROSCLEROSIS W/ 30-40% NARROWING IN THE MID LAD & 50% NARROWING IN THE SMALL OBTUSE MARGINAL,  NORMAL RENAL ARTERIES W/ NO ABD. AORTIC DISEASE  . Iliac artery stent Bilateral 03/07/2014  . Tonsillectomy and adenoidectomy  1950's  .  Cataract extraction w/ intraocular lens  implant, bilateral Bilateral 2013  . Dilation and curettage of uterus    . Abdominal angiogram N/A 03/07/2014    Procedure: ABDOMINAL ANGIOGRAM;  Surgeon: Wellington Hampshire, MD;  Location: [facility] CATH LAB;  Service: Cardiovascular;  Laterality: N/A;  . Percutaneous stent intervention Bilateral 03/07/2014    Procedure: PERCUTANEOUS STENT INTERVENTION;  Surgeon: Wellington Hampshire, MD;  Location: [facility] CATH LAB;  Service: Cardiovascular;  Laterality: Bilateral;  bilat iliac stents  . Lower extremity angiogram Bilateral 03/07/2014    Procedure: LOWER EXTREMITY ANGIOGRAM;  Surgeon: Wellington Hampshire, MD;  Location: [facility] CATH LAB;  Service: Cardiovascular;  Laterality: Bilateral;    History   Social History  . Marital Status: Married    Spouse Name: N/A  . Number of Children: N/A  . Years of Education: N/A   Occupational History  . Not on file.   Social History Main Topics  . Smoking status: Former Smoker -- 1.00 packs/day for 17 years    Types: Cigarettes    Quit date: 12/28/1982  . Smokeless tobacco: Never Used  . Alcohol Use: Yes     Comment: 03/07/2014 "haven't had a drink in 3 yr  now; never had problems w/it"  . Drug Use: No  . Sexual Activity: No   Other Topics Concern  . Not on file   Social History Narrative      Constitutional: He is oriented to person, place, and time. He appears well-developed and well-nourished. No distress.  HENT: No nasal discharge.  Head: Normocephalic and atraumatic.  Eyes: Pupils are equal and round.  No discharge. Neck: Normal range of motion. Neck supple. No JVD present. No thyromegaly present.  Cardiovascular: Normal rate, regular rhythm, normal heart sounds. Exam reveals no gallop and no friction rub. No murmur heard.  Pulmonary/Chest: Effort normal and breath sounds normal. No stridor. No respiratory distress. He has no wheezes. He has no rales. He exhibits no tenderness.  Abdominal: Soft. Bowel sounds are normal. He exhibits no distension. There is no tenderness. There is no rebound and no guarding.  Musculoskeletal: Normal range of motion. He exhibits no edema and no tenderness.  Neurological: He is alert and oriented to person, place, and time. Coordination normal.  Skin: Skin is warm and dry. No rash noted. He is not diaphoretic. No erythema. No pallor.  Psychiatric: He has a normal mood and affect. His behavior is normal. Judgment and thought content normal.  Vascular: Femoral pulse: slightly diminished bilaterally. Dorsalis pedis and posterior tibial: +2 on the right side and +2 on the left side.

## 2015-06-18 NOTE — Assessment & Plan Note (Signed)
She is doing well with patent iliac stents. Continue medical therapy and repeat aortoiliac duplex an ABI in one year.

## 2015-07-17 ENCOUNTER — Other Ambulatory Visit (HOSPITAL_COMMUNITY): Payer: Self-pay | Admitting: *Deleted

## 2015-07-18 ENCOUNTER — Inpatient Hospital Stay (HOSPITAL_COMMUNITY): Admission: RE | Admit: 2015-07-18 | Payer: Medicare Other | Source: Ambulatory Visit

## 2015-07-25 ENCOUNTER — Ambulatory Visit (HOSPITAL_COMMUNITY)
Admission: RE | Admit: 2015-07-25 | Discharge: 2015-07-25 | Disposition: A | Discharge status: Home / Self Care | Payer: Medicare Other | Source: Ambulatory Visit | Attending: Internal Medicine | Admitting: Internal Medicine

## 2015-07-25 DIAGNOSIS — M810 Age-related osteoporosis without current pathological fracture: Secondary | ICD-10-CM | POA: Diagnosis present

## 2015-07-25 MED ORDER — ZOLEDRONIC ACID 5 MG/100ML IV SOLN
5.0000 mg | Freq: Once | INTRAVENOUS | Status: DC
Start: 2015-07-25 — End: 2015-07-26

## 2015-07-25 MED ORDER — ZOLEDRONIC ACID 5 MG/100ML IV SOLN
INTRAVENOUS | Status: AC
  Administered 2015-07-25: 11:00:00
  Filled 2015-07-25: qty 100

## 2015-08-14 ENCOUNTER — Other Ambulatory Visit: Payer: Self-pay | Admitting: Internal Medicine

## 2015-08-14 ENCOUNTER — Other Ambulatory Visit (HOSPITAL_COMMUNITY): Payer: Self-pay | Admitting: Internal Medicine

## 2015-08-14 DIAGNOSIS — R911 Solitary pulmonary nodule: Secondary | ICD-10-CM

## 2015-08-16 ENCOUNTER — Ambulatory Visit
Admission: RE | Admit: 2015-08-16 | Discharge: 2015-08-16 | Disposition: A | Discharge status: Home / Self Care | Payer: Medicare Other | Source: Ambulatory Visit | Attending: Internal Medicine | Admitting: Internal Medicine

## 2015-08-16 DIAGNOSIS — R911 Solitary pulmonary nodule: Secondary | ICD-10-CM

## 2015-08-20 ENCOUNTER — Encounter (HOSPITAL_COMMUNITY): Payer: Medicare Other

## 2015-08-28 ENCOUNTER — Encounter (HOSPITAL_COMMUNITY)
Admission: RE | Admit: 2015-08-28 | Discharge: 2015-08-28 | Disposition: A | Discharge status: Home / Self Care | Payer: Medicare Other | Source: Ambulatory Visit | Attending: Internal Medicine | Admitting: Internal Medicine

## 2015-08-28 ENCOUNTER — Encounter (HOSPITAL_COMMUNITY): Payer: Medicare (Managed Care)

## 2015-08-28 DIAGNOSIS — R946 Abnormal results of thyroid function studies: Secondary | ICD-10-CM | POA: Diagnosis not present

## 2015-08-28 MED ORDER — TECHNETIUM TC 99M SESTAMIBI - CARDIOLITE
25.0000 | Freq: Once | INTRAVENOUS | Status: AC | PRN
  Administered 2015-08-28: 25 via INTRAVENOUS

## 2015-09-17 ENCOUNTER — Ambulatory Visit: Payer: Self-pay | Admitting: Surgery

## 2015-09-23 ENCOUNTER — Telehealth: Payer: Self-pay | Admitting: *Deleted

## 2015-09-23 NOTE — Telephone Encounter (Signed)
Request for surgical clearance:  1. What type of surgery is being performed? Parathyroidectomy   2. When is this surgery scheduled? 09/26/15   3. Are there any medications that need to be held prior to surgery and how long? Not sure   4. Name of physician performing surgery? Dr Delana Meyer   5. What is your office phone and fax number? Pre admit [phone]   Please reply back in epic, they will keep an eye in epic.

## 2015-09-23 NOTE — Patient Instructions (Addendum)
YOUR PROCEDURE IS SCHEDULED ON :  09/26/15  REPORT TO [facility] MAIN ENTRANCE FOLLOW SIGNS TO EAST ELEVATOR - GO TO 3rd FLOOR CHECK IN AT 3 EAST NURSES STATION (SHORT STAY) AT:  10:30 AM  CALL THIS NUMBER IF YOU HAVE PROBLEMS THE MORNING OF SURGERY [phone]  REMEMBER:ONLY 1 PER PERSON MAY GO TO SHORT STAY WITH YOU TO GET READY THE MORNING OF YOUR SURGERY  DO NOT EAT FOOD OR DRINK LIQUIDS AFTER MIDNIGHT  TAKE THESE MEDICINES THE MORNING OF SURGERY: ALPRAZOLAM / SYNTHROID / PRILOSEC / TOPROL / PROZAC / MAY TAKE OXYCODONE IF NEEDED FOR PAIN  STOP ASPIRIN / IBUPROFEN / ALEVE / VITAMINS / HERBAL MEDS __5__ DAYS BEFORE SURGERY  YOU MAY NOT HAVE ANY METAL ON YOUR BODY INCLUDING HAIR PINS AND PIERCING'S. DO NOT WEAR JEWELRY, MAKEUP, LOTIONS, POWDERS OR PERFUMES. DO NOT WEAR NAIL POLISH. DO NOT SHAVE 48 HRS PRIOR TO SURGERY. MEN MAY SHAVE FACE AND NECK.  DO NOT [facility]. [facility] IS NOT RESPONSIBLE FOR VALUABLES.  CONTACTS, DENTURES OR PARTIALS MAY NOT BE WORN TO SURGERY. LEAVE SUITCASE IN CAR. CAN BE BROUGHT TO ROOM AFTER SURGERY.  PATIENTS DISCHARGED THE DAY OF SURGERY WILL NOT BE ALLOWED TO DRIVE HOME.  PLEASE READ OVER THE FOLLOWING INSTRUCTION SHEETS _________________________________________________________________________________                                          [facility] - PREPARING FOR SURGERY  Before surgery, you can play an important role.  Because skin is not sterile, your skin needs to be as free of germs as possible.  You can reduce the number of germs on your skin by washing with CHG (chlorahexidine gluconate) soap before surgery.  CHG is an antiseptic cleaner which kills germs and bonds with the skin to continue killing germs even after washing. Please DO NOT use if you have an allergy to CHG or antibacterial soaps.  If your skin becomes reddened/irritated stop using the CHG and inform your nurse when you arrive at Short  Stay. Do not shave (including legs and underarms) for at least 48 hours prior to the first CHG shower.  You may shave your face. Please follow these instructions carefully:   1.  Shower with CHG Soap the night before surgery and the  morning of Surgery.   2.  If you choose to wash your hair, wash your hair first as usual with your  normal  Shampoo.   3.  After you shampoo, rinse your hair and body thoroughly to remove the  shampoo.                                         4.  Use CHG as you would any other liquid soap.  You can apply chg directly  to the skin and wash . Gently wash with scrungie or clean wascloth    5.  Apply the CHG Soap to your body ONLY FROM THE NECK DOWN.   Do not use on open                           Wound or open sores. Avoid contact with eyes, ears mouth and genitals (private parts).  Genitals (private parts) with your normal soap.              6.  Wash thoroughly, paying special attention to the area where your surgery  will be performed.   7.  Thoroughly rinse your body with warm water from the neck down.   8.  DO NOT shower/wash with your normal soap after using and rinsing off  the CHG Soap .                9.  Pat yourself dry with a clean towel.             10.  Wear clean night clothes to bed after shower             11.  Place clean sheets on your bed the night of your first shower and do not  sleep with pets.  Day of Surgery : Do not apply any lotions/deodorants the morning of surgery.  Please wear clean clothes to the hospital/surgery center.  FAILURE TO FOLLOW THESE INSTRUCTIONS MAY RESULT IN THE CANCELLATION OF YOUR SURGERY    PATIENT SIGNATURE_________________________________  ______________________________________________________________________

## 2015-09-24 NOTE — Telephone Encounter (Signed)
Forward to [facility] for clearance

## 2015-09-24 NOTE — Telephone Encounter (Signed)
Low risk

## 2015-09-25 ENCOUNTER — Encounter (HOSPITAL_COMMUNITY)
Admission: RE | Admit: 2015-09-25 | Discharge: 2015-09-25 | Disposition: A | Discharge status: Home / Self Care | Payer: Medicare Other | Source: Ambulatory Visit | Attending: Surgery | Admitting: Surgery

## 2015-09-25 ENCOUNTER — Encounter (HOSPITAL_COMMUNITY): Payer: Self-pay

## 2015-09-25 ENCOUNTER — Encounter (HOSPITAL_COMMUNITY): Payer: Self-pay | Admitting: Surgery

## 2015-09-25 DIAGNOSIS — E213 Hyperparathyroidism, unspecified: Secondary | ICD-10-CM | POA: Diagnosis not present

## 2015-09-25 DIAGNOSIS — Z01818 Encounter for other preprocedural examination: Secondary | ICD-10-CM | POA: Diagnosis present

## 2015-09-25 DIAGNOSIS — E210 Primary hyperparathyroidism: Secondary | ICD-10-CM | POA: Diagnosis present

## 2015-09-25 HISTORY — DX: Fibromyalgia: M79.7

## 2015-09-25 HISTORY — DX: Essential (primary) hypertension: I10

## 2015-09-25 HISTORY — DX: Personal history of other diseases of the musculoskeletal system and connective tissue: Z87.39

## 2015-09-25 HISTORY — DX: Angina pectoris, unspecified: I20.9

## 2015-09-25 LAB — BASIC METABOLIC PANEL
Anion gap: 9 (ref 5–15)
BUN: 21 mg/dL — AB (ref 6–20)
CALCIUM: 11.4 mg/dL — AB (ref 8.9–10.3)
CO2: 25 mmol/L (ref 22–32)
CREATININE: 1.12 mg/dL — AB (ref 0.44–1.00)
Chloride: 103 mmol/L (ref 101–111)
GFR calc Af Amer: 56 mL/min — ABNORMAL LOW (ref >60)
GFR, EST NON AFRICAN AMERICAN: 49 mL/min — AB (ref >60)
GLUCOSE: 108 mg/dL — AB (ref 65–99)
Potassium: 4.3 mmol/L (ref 3.5–5.1)
SODIUM: 137 mmol/L (ref 135–145)

## 2015-09-25 LAB — CBC
HCT: 38.4 % (ref 36.0–46.0)
Hemoglobin: 12.5 g/dL (ref 12.0–15.0)
MCH: 29.2 pg (ref 26.0–34.0)
MCHC: 32.6 g/dL (ref 30.0–36.0)
MCV: 89.7 fL (ref 78.0–100.0)
PLATELETS: 305 10*3/uL (ref 150–400)
RBC: 4.28 MIL/uL (ref 3.87–5.11)
RDW: 13.1 % (ref 11.5–15.5)
WBC: 10 10*3/uL (ref 4.0–10.5)

## 2015-09-25 NOTE — Telephone Encounter (Signed)
Left message on [facility] pre-admit VM regarding clearance for surgery. Can send letter if phone note response is not sufficient.  Left CB number if questions.

## 2015-09-25 NOTE — H&P (Signed)
General Surgery [facility]  [name] [name] DOB: Mar 03, 1946 Married / Language: English / Race: White Female  History of Present Illness  The patient is a 69 year old female who presents with a parathyroid neoplasm. Patient referred by Dr. 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 Patient had been noted on routine laboratory studies to have an elevated serum calcium level. Levels ranged from 11.0-11.9. However an intact PTH level was normal at 28. Patient does have a history of nephrolithiasis. She also has a history of osteoporosis. She has experienced loss of weight of 40 pounds over the past 6 months and has no significant appetite. She has had no prior surgery on the head or neck. She denies any family history of endocrine neoplasms. Patient underwent nuclear medicine parathyroid scan on August 28, 2015. This shows intense uptake at the level of the sternal notch with the finding corresponding to a soft tissue mass identified at this level on CT scan of the chest. 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 Patient presents today to be evaluated for surgical resection.   Other Problems Back Pain Bladder Problems Chest pain Depression Gastroesophageal Reflux Disease Thyroid Disease Vascular Disease  Past Surgical History Shoulder Surgery Left. Tonsillectomy  Diagnostic Studies History Colonoscopy 5-10 years ago  Allergies  Cephalosporins Hives.  Medication History Albuterol Sulfate HFA (108 (90 Base)MCG/ACT Aerosol Soln, Inhalation) Active. Xanax (0.5MG  Tablet, Oral) Active. Aspirin (81MG  Tablet DR, Oral) Active. Lipitor (40MG  Tablet, Oral) Active. CeleBREX (200MG  Capsule, Oral) Active. Diltiazem CD (120MG  Capsule ER 24HR, Oral) Active. Zetia (10MG  Tablet, Oral) Active. PROzac (20MG  Capsule, Oral) Active. Lasix (20MG  Tablet, Oral as  needed) Active. Midrin (325-65-100MG  Capsule, Oral) Active. Cozaar (25MG  Tablet, Oral) Active. MetFORMIN HCl (1000MG  Tablet, Oral) Active. Toprol XL (25MG  Tablet ER 24HR, Oral) Active. Nitrostat (0.4MG  Tab Sublingual, Sublingual) Active. PriLOSEC (20MG  Capsule DR, Oral) Active. Oxycodone-Acetaminophen (7.5-325MG  Tablet, Oral as needed) Active. Protonix (40MG  Packet, Oral) Active. Synthroid (125MCG Tablet, Oral) Active. Ultram (50MG  Tablet, Oral as needed) Active. Vitamin D (50000UNIT Capsule, Oral) Active. Medications Reconciled  Social History Alcohol use Occasional alcohol use. Caffeine use Carbonated beverages, Coffee, Tea. No drug use Tobacco use Former smoker.  Family History Arthritis Sister. Heart Disease Brother, Father, Mother, Sister, Son. Heart disease in female family member before age 12 Migraine Headache Father.  Pregnancy / Birth History Age at menarche 55 years. Age of menopause 29-55 Gravida 2 Para 2  Review of Systems General Present- Appetite Loss, Chills, Fatigue, Night Sweats and Weight Loss. Not Present- Fever and Weight Gain. Skin Present- Dryness. Not Present- Change in Wart/Mole, Hives, Jaundice, New Lesions, Non-Healing Wounds, Rash and Ulcer. HEENT Present- Hearing Loss, Ringing in the Ears, Seasonal Allergies, Visual Disturbances and Wears glasses/contact lenses. Not Present- Earache, Hoarseness, Nose Bleed, Oral Ulcers, Sinus Pain, Sore Throat and Yellow Eyes. Cardiovascular Present- Chest Pain and Leg Cramps. Not Present- Difficulty Breathing Lying Down, Palpitations, Rapid Heart Rate, Shortness of Breath and Swelling of Extremities. Gastrointestinal Present- Abdominal Pain, Bloating and Indigestion. Not Present- Bloody Stool, Change in Bowel Habits, Chronic diarrhea, Constipation, Difficulty Swallowing, Excessive gas, Gets full quickly at meals, Hemorrhoids, Nausea, Rectal Pain and Vomiting. Neurological Present- Headaches  and Weakness. Not Present- Decreased Memory, Fainting, Numbness, Seizures, Tingling, Tremor and Trouble walking. Psychiatric Present- Anxiety and Fearful. Not Present- Bipolar, Change in Sleep Pattern, Depression and Frequent crying. Endocrine Present- Cold Intolerance and Hair Changes. Not Present- Excessive Hunger, Heat  Intolerance, Hot flashes and New Diabetes.   Vitals Weight: 164.2 lb Height: 67in Body Surface Area: 1.88 m Body Mass Index: 25.72 kg/m Temp.: 17F(Oral)  Pulse: 82 (Regular)  BP: 122/82 (Sitting, Left Arm, Standard)    Physical Exam   General - appears comfortable, no distress; not diaphorectic  HEENT - normocephalic; sclerae clear, gaze conjugate; mucous membranes moist, dentition good; voice normal  Neck - symmetric on extension; no palpable anterior or posterior cervical adenopathy; no palpable masses in the thyroid bed  Chest - clear bilaterally without rhonchi, rales, or wheeze  Cor - regular rhythm with normal rate; no significant murmur  Ext - non-tender without significant edema or lymphedema  Neuro - grossly intact; no tremor    Assessment & Plan  PRIMARY HYPERPARATHYROIDISM (E21.0)  Pt Education - Pamphlet Given - The Parathyroid Surgery Book: discussed with patient and provided information.   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 I have provided her with written literature on parathyroid surgery to review at home.  I have reviewed both her CT scan and her nuclear medicine parathyroid scan which indeed indicate the presence of an ectopic parathyroid adenoma at the level of the sternal notch. I have discussed minimally invasive surgery as an outpatient with the patient and her husband. We have discussed her symptoms and the likelihood of symptomatic improvement following surgery. They understand and wish to proceed.  The risks and benefits of the procedure have been  discussed at length with the patient. The patient understands the proposed procedure, potential alternative treatments, and the course of recovery to be expected. All of the patient's questions have been answered at this time. The patient wishes to proceed with surgery.  Earnstine Regal, MD, [facility] Office: [phone]

## 2015-09-26 ENCOUNTER — Ambulatory Visit (HOSPITAL_COMMUNITY): Payer: Medicare Other | Admitting: Anesthesiology

## 2015-09-26 ENCOUNTER — Encounter (HOSPITAL_COMMUNITY)
Admission: RE | Disposition: A | Discharge status: Home / Self Care | Payer: Self-pay | Source: Ambulatory Visit | Attending: Surgery

## 2015-09-26 ENCOUNTER — Ambulatory Visit (HOSPITAL_COMMUNITY)
Admission: RE | Admit: 2015-09-26 | Discharge: 2015-09-26 | Disposition: A | Discharge status: Home / Self Care | Payer: Medicare Other | Source: Ambulatory Visit | Attending: Surgery | Admitting: Surgery

## 2015-09-26 ENCOUNTER — Encounter (HOSPITAL_COMMUNITY): Payer: Self-pay

## 2015-09-26 DIAGNOSIS — Z6824 Body mass index (BMI) 24.0-24.9, adult: Secondary | ICD-10-CM | POA: Insufficient documentation

## 2015-09-26 DIAGNOSIS — F419 Anxiety disorder, unspecified: Secondary | ICD-10-CM | POA: Diagnosis not present

## 2015-09-26 DIAGNOSIS — Z87891 Personal history of nicotine dependence: Secondary | ICD-10-CM | POA: Insufficient documentation

## 2015-09-26 DIAGNOSIS — K219 Gastro-esophageal reflux disease without esophagitis: Secondary | ICD-10-CM | POA: Diagnosis not present

## 2015-09-26 DIAGNOSIS — E119 Type 2 diabetes mellitus without complications: Secondary | ICD-10-CM | POA: Insufficient documentation

## 2015-09-26 DIAGNOSIS — R634 Abnormal weight loss: Secondary | ICD-10-CM | POA: Insufficient documentation

## 2015-09-26 DIAGNOSIS — Z791 Long term (current) use of non-steroidal anti-inflammatories (NSAID): Secondary | ICD-10-CM | POA: Insufficient documentation

## 2015-09-26 DIAGNOSIS — J449 Chronic obstructive pulmonary disease, unspecified: Secondary | ICD-10-CM | POA: Diagnosis not present

## 2015-09-26 DIAGNOSIS — F329 Major depressive disorder, single episode, unspecified: Secondary | ICD-10-CM | POA: Insufficient documentation

## 2015-09-26 DIAGNOSIS — I252 Old myocardial infarction: Secondary | ICD-10-CM | POA: Insufficient documentation

## 2015-09-26 DIAGNOSIS — Z79899 Other long term (current) drug therapy: Secondary | ICD-10-CM | POA: Insufficient documentation

## 2015-09-26 DIAGNOSIS — I2510 Atherosclerotic heart disease of native coronary artery without angina pectoris: Secondary | ICD-10-CM | POA: Diagnosis not present

## 2015-09-26 DIAGNOSIS — Z7982 Long term (current) use of aspirin: Secondary | ICD-10-CM | POA: Insufficient documentation

## 2015-09-26 DIAGNOSIS — Z79891 Long term (current) use of opiate analgesic: Secondary | ICD-10-CM | POA: Diagnosis not present

## 2015-09-26 DIAGNOSIS — Z87442 Personal history of urinary calculi: Secondary | ICD-10-CM | POA: Diagnosis not present

## 2015-09-26 DIAGNOSIS — G709 Myoneural disorder, unspecified: Secondary | ICD-10-CM | POA: Diagnosis not present

## 2015-09-26 DIAGNOSIS — E210 Primary hyperparathyroidism: Secondary | ICD-10-CM | POA: Diagnosis present

## 2015-09-26 DIAGNOSIS — M1990 Unspecified osteoarthritis, unspecified site: Secondary | ICD-10-CM | POA: Insufficient documentation

## 2015-09-26 DIAGNOSIS — I10 Essential (primary) hypertension: Secondary | ICD-10-CM | POA: Diagnosis not present

## 2015-09-26 HISTORY — PX: PARATHYROIDECTOMY: SHX19

## 2015-09-26 LAB — GLUCOSE, CAPILLARY: GLUCOSE-CAPILLARY: 105 mg/dL — AB (ref 65–99)

## 2015-09-26 SURGERY — PARATHYROIDECTOMY
Anesthesia: General | Site: Neck

## 2015-09-26 MED ORDER — BUPIVACAINE HCL (PF) 0.25 % IJ SOLN
INTRAMUSCULAR | Status: AC
  Filled 2015-09-26: qty 30

## 2015-09-26 MED ORDER — ROCURONIUM BROMIDE 100 MG/10ML IV SOLN
INTRAVENOUS | Status: DC | PRN
  Administered 2015-09-26: 30 mg via INTRAVENOUS

## 2015-09-26 MED ORDER — DEXAMETHASONE SODIUM PHOSPHATE 10 MG/ML IJ SOLN
INTRAMUSCULAR | Status: AC
  Filled 2015-09-26: qty 1

## 2015-09-26 MED ORDER — HYDROMORPHONE HCL 1 MG/ML IJ SOLN
0.2500 mg | INTRAMUSCULAR | Status: DC | PRN
  Administered 2015-09-26 (×3): 0.5 mg via INTRAVENOUS

## 2015-09-26 MED ORDER — ROCURONIUM BROMIDE 100 MG/10ML IV SOLN
INTRAVENOUS | Status: AC
  Filled 2015-09-26: qty 1

## 2015-09-26 MED ORDER — GLYCOPYRROLATE 0.2 MG/ML IJ SOLN
INTRAMUSCULAR | Status: DC | PRN
Start: 2015-09-26 — End: 2015-09-26
  Administered 2015-09-26: 0.4 mg via INTRAVENOUS

## 2015-09-26 MED ORDER — LIDOCAINE HCL (CARDIAC) 20 MG/ML IV SOLN
INTRAVENOUS | Status: AC
  Filled 2015-09-26: qty 5

## 2015-09-26 MED ORDER — SUGAMMADEX SODIUM 200 MG/2ML IV SOLN
INTRAVENOUS | Status: DC | PRN
  Administered 2015-09-26: 100 mg via INTRAVENOUS

## 2015-09-26 MED ORDER — BUPIVACAINE HCL 0.25 % IJ SOLN
INTRAMUSCULAR | Status: DC | PRN
  Administered 2015-09-26: 10 mL

## 2015-09-26 MED ORDER — MIDAZOLAM HCL 2 MG/2ML IJ SOLN
INTRAMUSCULAR | Status: AC
  Filled 2015-09-26: qty 4

## 2015-09-26 MED ORDER — HYDROMORPHONE HCL 1 MG/ML IJ SOLN
INTRAMUSCULAR | Status: AC
  Filled 2015-09-26: qty 1

## 2015-09-26 MED ORDER — PHENYLEPHRINE HCL 10 MG/ML IJ SOLN
INTRAMUSCULAR | Status: DC | PRN
  Administered 2015-09-26: 120 ug via INTRAVENOUS
  Administered 2015-09-26: 80 ug via INTRAVENOUS

## 2015-09-26 MED ORDER — PROPOFOL 10 MG/ML IV BOLUS
INTRAVENOUS | Status: AC
  Filled 2015-09-26: qty 20

## 2015-09-26 MED ORDER — ONDANSETRON HCL 4 MG/2ML IJ SOLN
INTRAMUSCULAR | Status: DC | PRN
  Administered 2015-09-26: 4 mg via INTRAVENOUS

## 2015-09-26 MED ORDER — PROMETHAZINE HCL 25 MG/ML IJ SOLN: 6.2500 mg | INTRAMUSCULAR | Status: DC | PRN

## 2015-09-26 MED ORDER — 0.9 % SODIUM CHLORIDE (POUR BTL) OPTIME
TOPICAL | Status: DC | PRN
  Administered 2015-09-26: 1000 mL

## 2015-09-26 MED ORDER — HYDROCODONE-ACETAMINOPHEN 5-325 MG PO TABS: 1.0000 | ORAL_TABLET | ORAL | Status: DC | PRN

## 2015-09-26 MED ORDER — CIPROFLOXACIN IN D5W 400 MG/200ML IV SOLN
INTRAVENOUS | Status: AC
  Filled 2015-09-26: qty 200

## 2015-09-26 MED ORDER — SUCCINYLCHOLINE CHLORIDE 20 MG/ML IJ SOLN
INTRAMUSCULAR | Status: DC | PRN
  Administered 2015-09-26: 100 mg via INTRAVENOUS

## 2015-09-26 MED ORDER — LACTATED RINGERS IV SOLN
INTRAVENOUS | Status: DC
  Administered 2015-09-26: 13:00:00 via INTRAVENOUS
  Administered 2015-09-26: 1000 mL via INTRAVENOUS

## 2015-09-26 MED ORDER — SUGAMMADEX SODIUM 200 MG/2ML IV SOLN
INTRAVENOUS | Status: AC
  Filled 2015-09-26: qty 2

## 2015-09-26 MED ORDER — NEOSTIGMINE METHYLSULFATE 10 MG/10ML IV SOLN
INTRAVENOUS | Status: DC | PRN
  Administered 2015-09-26: 3 mg via INTRAVENOUS

## 2015-09-26 MED ORDER — PROPOFOL 10 MG/ML IV BOLUS
INTRAVENOUS | Status: DC | PRN
  Administered 2015-09-26: 130 mg via INTRAVENOUS

## 2015-09-26 MED ORDER — LACTATED RINGERS IV SOLN: INTRAVENOUS | Status: DC

## 2015-09-26 MED ORDER — MIDAZOLAM HCL 5 MG/5ML IJ SOLN
INTRAMUSCULAR | Status: DC | PRN
  Administered 2015-09-26 (×2): 1 mg via INTRAVENOUS

## 2015-09-26 MED ORDER — CIPROFLOXACIN IN D5W 400 MG/200ML IV SOLN
400.0000 mg | INTRAVENOUS | Status: AC
  Administered 2015-09-26: 400 mg via INTRAVENOUS

## 2015-09-26 MED ORDER — HYDROCODONE-ACETAMINOPHEN 5-325 MG PO TABS
1.0000 | ORAL_TABLET | ORAL | Status: DC | PRN
  Administered 2015-09-26: 1 via ORAL
  Filled 2015-09-26: qty 1

## 2015-09-26 MED ORDER — FENTANYL CITRATE (PF) 250 MCG/5ML IJ SOLN
INTRAMUSCULAR | Status: AC
  Filled 2015-09-26: qty 25

## 2015-09-26 MED ORDER — FENTANYL CITRATE (PF) 100 MCG/2ML IJ SOLN
INTRAMUSCULAR | Status: DC | PRN
  Administered 2015-09-26 (×3): 50 ug via INTRAVENOUS

## 2015-09-26 MED ORDER — ONDANSETRON HCL 4 MG/2ML IJ SOLN
INTRAMUSCULAR | Status: AC
  Filled 2015-09-26: qty 2

## 2015-09-26 MED ORDER — LIDOCAINE HCL (CARDIAC) 20 MG/ML IV SOLN
INTRAVENOUS | Status: DC | PRN
  Administered 2015-09-26: 75 mg via INTRAVENOUS
  Administered 2015-09-26: 25 mg via INTRATRACHEAL

## 2015-09-26 MED ORDER — MEPERIDINE HCL 50 MG/ML IJ SOLN: 6.2500 mg | INTRAMUSCULAR | Status: DC | PRN

## 2015-09-26 SURGICAL SUPPLY — 39 items
ATTRACTOMAT 16X20 MAGNETIC DRP (DRAPES) ×3 IMPLANT
BENZOIN TINCTURE PRP APPL 2/3 (GAUZE/BANDAGES/DRESSINGS) ×3 IMPLANT
BLADE HEX COATED 2.75 (ELECTRODE) ×3 IMPLANT
BLADE SURG 15 STRL LF DISP TIS (BLADE) ×1 IMPLANT
BLADE SURG 15 STRL SS (BLADE) ×2
CHLORAPREP W/TINT 26ML (MISCELLANEOUS) ×3 IMPLANT
CLIP TI MEDIUM 6 (CLIP) ×6 IMPLANT
CLIP TI WIDE RED SMALL 6 (CLIP) ×6 IMPLANT
CLOSURE STERI-STRIP 1/4X4 (GAUZE/BANDAGES/DRESSINGS) ×3 IMPLANT
CLOSURE WOUND 1/2 X4 (GAUZE/BANDAGES/DRESSINGS)
COVER SURGICAL LIGHT HANDLE (MISCELLANEOUS) ×3 IMPLANT
DRAPE LAPAROTOMY T 98X78 PEDS (DRAPES) ×3 IMPLANT
DRESSING SURGICEL FIBRLLR 1X2 (HEMOSTASIS) ×1 IMPLANT
DRSG SURGICEL FIBRILLAR 1X2 (HEMOSTASIS) ×3
ELECT PENCIL ROCKER SW 15FT (MISCELLANEOUS) ×3 IMPLANT
ELECT REM PT RETURN 9FT ADLT (ELECTROSURGICAL) ×3
ELECTRODE REM PT RTRN 9FT ADLT (ELECTROSURGICAL) ×1 IMPLANT
GAUZE SPONGE 4X4 12PLY STRL (GAUZE/BANDAGES/DRESSINGS) ×3 IMPLANT
GAUZE SPONGE 4X4 16PLY XRAY LF (GAUZE/BANDAGES/DRESSINGS) ×3 IMPLANT
GLOVE SURG ORTHO 8.0 STRL STRW (GLOVE) ×3 IMPLANT
GOWN STRL REUS W/TWL XL LVL3 (GOWN DISPOSABLE) ×9 IMPLANT
KIT BASIN OR (CUSTOM PROCEDURE TRAY) ×3 IMPLANT
LIQUID BAND (GAUZE/BANDAGES/DRESSINGS) IMPLANT
NEEDLE HYPO 25X1 1.5 SAFETY (NEEDLE) ×3 IMPLANT
PACK BASIC VI WITH GOWN DISP (CUSTOM PROCEDURE TRAY) ×3 IMPLANT
STAPLER VISISTAT 35W (STAPLE) ×3 IMPLANT
STRIP CLOSURE SKIN 1/2X4 (GAUZE/BANDAGES/DRESSINGS) IMPLANT
SUT MNCRL AB 4-0 PS2 18 (SUTURE) ×3 IMPLANT
SUT SILK 2 0 (SUTURE)
SUT SILK 2-0 18XBRD TIE 12 (SUTURE) IMPLANT
SUT SILK 3 0 (SUTURE)
SUT SILK 3-0 18XBRD TIE 12 (SUTURE) IMPLANT
SUT VIC AB 3-0 SH 18 (SUTURE) ×3 IMPLANT
SYR BULB IRRIGATION 50ML (SYRINGE) ×3 IMPLANT
SYR CONTROL 10ML LL (SYRINGE) ×3 IMPLANT
TAPE CLOTH SURG 4X10 WHT LF (GAUZE/BANDAGES/DRESSINGS) ×3 IMPLANT
TOWEL OR 17X26 10 PK STRL BLUE (TOWEL DISPOSABLE) ×3 IMPLANT
TOWEL OR NON WOVEN STRL DISP B (DISPOSABLE) ×3 IMPLANT
YANKAUER SUCT BULB TIP 10FT TU (MISCELLANEOUS) ×3 IMPLANT

## 2015-09-26 NOTE — Discharge Instructions (Signed)

## 2015-09-26 NOTE — Anesthesia Preprocedure Evaluation (Addendum)
Anesthesia Evaluation  Patient identified by MRN, date of birth, ID band Patient awake    Reviewed: Allergy & Precautions, NPO status , Patient's Chart, lab work & pertinent test results, reviewed documented beta blocker date and time   History of Anesthesia Complications (+) PONV  Airway Mallampati: I  TM Distance: >3 FB Neck ROM: Full    Dental  (+) Teeth Intact   Pulmonary COPD, former smoker,    breath sounds clear to auscultation       Cardiovascular hypertension, Pt. on medications and Pt. on home beta blockers + CAD and + Past MI   Rhythm:Regular Rate:Normal     Neuro/Psych PSYCHIATRIC DISORDERS Anxiety Depression  Neuromuscular disease    GI/Hepatic GERD  Medicated,  Endo/Other  diabetes, Type 2, Oral Hypoglycemic AgentsHypothyroidism   Renal/GU      Musculoskeletal  (+) Arthritis , Osteoarthritis,    Abdominal   Peds negative pediatric ROS (+)  Hematology   Anesthesia Other Findings   Reproductive/Obstetrics                           Lab Results  Component Value Date   WBC 10.0 09/25/2015   HGB 12.5 09/25/2015   HCT 38.4 09/25/2015   MCV 89.7 09/25/2015   PLT 305 09/25/2015   Lab Results  Component Value Date   CREATININE 1.12* 09/25/2015   BUN 21* 09/25/2015   NA 137 09/25/2015   K 4.3 09/25/2015   CL 103 09/25/2015   CO2 25 09/25/2015   Lab Results  Component Value Date   INR 1.01 03/07/2014   INR 1.1* 02/27/2014   INR 1.0 05/10/2013   EKG: sinus bradycardia.   Anesthesia Physical Anesthesia Plan  ASA: III  Anesthesia Plan: General   Post-op Pain Management:    Induction: Intravenous  Airway Management Planned: Oral ETT  Additional Equipment:   Intra-op Plan:   Post-operative Plan: Extubation in OR  Informed Consent: I have reviewed the patients History and Physical, chart, labs and discussed the procedure including the risks, benefits and  alternatives for the proposed anesthesia with the patient or authorized representative who has indicated his/her understanding and acceptance.   Dental advisory given  Plan Discussed with: CRNA  Anesthesia Plan Comments: (Anesthetic plan discussed in detail. Associated risk discussed including but not limited to life threatening cardiovascular, pulmonary events and dental damage. The postoperative pain management and antiemetic plan discussed with patient. All questions answered in detail. Patient is in agreement.   )       Anesthesia Quick Evaluation

## 2015-09-26 NOTE — Transfer of Care (Signed)
Immediate Anesthesia Transfer of Care Note  Patient: [name] [name]  Procedure(s) Performed: Procedure(s): PARATHYROIDECTOMY (N/A)  Patient Location: PACU  Anesthesia Type:General  Level of Consciousness: awake, alert , oriented, patient cooperative and responds to stimulation  Airway & Oxygen Therapy: Patient Spontanous Breathing and Patient connected to face mask oxygen  Post-op Assessment: Report given to RN, Post -op Vital signs reviewed and stable and Patient moving all extremities X 4  Post vital signs: stable  Last Vitals:  Filed Vitals:   09/26/15 1325  BP: 132/74  Temp: 36.6 C  Resp: 15    Complications: No apparent anesthesia complications  Anxious but cooperative

## 2015-09-26 NOTE — Op Note (Signed)
[name] [name], [name] [name]                ACCOUNT NO.:  [id number]  MEDICAL RECORD NO.:  [id number]  LOCATION:  [facility]                         FACILITY:  [facility]  PHYSICIAN:  Earnstine Regal, MD      DATE OF BIRTH:  September 25, 1946  DATE OF PROCEDURE:  09/26/2015                              OPERATIVE REPORT   PREOPERATIVE DIAGNOSIS:  Primary hyperparathyroidism.  POSTOPERATIVE DIAGNOSIS:  Primary hyperparathyroidism.  PROCEDURE:  Minimally invasive excision of ectopic parathyroid adenoma, left thyrothymic tract.  SURGEON:  Earnstine Regal, MD, FACS  ASSISTANT:  Odis Hollingshead, MD, FACS  ANESTHESIA:  General.  ESTIMATED BLOOD LOSS:  Minimal.  PREPARATION:  ChloraPrep.  COMPLICATIONS:  None.  INDICATIONS:  The patient is a 69 year old, white female, referred by Dr. Lang Snow for primary hyperparathyroidism.  The patient has had calcium levels ranging from 11.0-11.9.  However, a recent intact PTH level was normal at 28.  The patient underwent nuclear medicine parathyroid scan on August 28, 2015.  This showed an area of increased activity at the level of the sternal notch.  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  The patient now comes to Surgery for exploration and resection.  BODY OF REPORT:  Procedure was done in OR #4 at the [facility].  The patient was brought to the operating room, placed in supine position on the operating room table.  Following administration of general anesthesia, the patient was positioned and then prepped and draped in the usual aseptic fashion.  After ascertaining that an adequate level of anesthesia had been achieved, a low Kocher incision was made in the midline of the neck for approximately 4 cm in length.  Dissection was carried through subcutaneous tissues and  platysma.  Hemostasis was achieved with electrocautery.  Inferior skin flap was developed.  Strap muscles were incised in the midline and dissection was carried inferiorly to the sternal notch.  Exploration immediately posterior to the manubrium and slightly to the left of midline reveals a soft tissue mass.  This was dissected out.  This appears to be an enlarged parathyroid gland contained within the thyrothymic tract.  Gland was gently mobilized circumferentially and excised.  Electrocautery was used for hemostasis. The entire mass was excised and submitted to Pathology where Dr. Evalee Jefferson confirms parathyroid tissue consistent with parathyroid adenoma.  Good hemostasis was achieved throughout the operative field.  Strap muscles were reapproximated in the midline with interrupted 3-0 Vicryl sutures.  Platysma was closed with interrupted 3-0 Vicryl sutures.  Skin edges were anesthetized with local anesthetic.  Skin edges were closed with a running 4-0 Monocryl subcuticular suture.  Wound was washed and dried and benzoin and Steri-Strips were applied.  Sterile dressings were applied.  The patient was awakened from anesthesia and brought to the recovery room.  The patient tolerated the procedure well.   Earnstine Regal, MD, [facility] Office: [phone]   TMG/MEDQ  D:  09/26/2015  T:  09/26/2015  Job:  [id number]  cc:   Odis Hollingshead, M.D. [address], [address] [place] [address]  Janalyn Rouse, M.D. Fax: [phone]

## 2015-09-26 NOTE — Anesthesia Procedure Notes (Signed)
Procedure Name: Intubation Date/Time: 09/26/2015 12:18 PM Performed by: Lissa Morales Pre-anesthesia Checklist: Patient identified, Emergency Drugs available, Suction available and Patient being monitored Patient Re-evaluated:Patient Re-evaluated prior to inductionOxygen Delivery Method: Circle System Utilized Preoxygenation: Pre-oxygenation with 100% oxygen Intubation Type: IV induction Ventilation: Mask ventilation without difficulty Laryngoscope Size: Mac and 3 Grade View: Grade II Tube type: Oral Tube size: 7.5 mm Number of attempts: 1 Airway Equipment and Method: Stylet and Oral airway Placement Confirmation: ETT inserted through vocal cords under direct vision,  positive ETCO2 and breath sounds checked- equal and bilateral Secured at: 21 cm Tube secured with: Tape Dental Injury: Teeth and Oropharynx as per pre-operative assessment

## 2015-09-26 NOTE — Brief Op Note (Signed)
09/26/2015  1:12 PM  PATIENT:  [name] [name]  69 y.o. female  PRE-OPERATIVE DIAGNOSIS:  Primary Hyperparathyroidism  POST-OPERATIVE DIAGNOSIS:  Primary Hyperparathyroidism  PROCEDURE:  Procedure(s): PARATHYROIDECTOMY (N/A)  SURGEON:  Surgeon(s) and Role:    * Armandina Gemma, MD - Primary    * Jackolyn Confer, MD - Assisting  ANESTHESIA:   general  EBL:     BLOOD ADMINISTERED:none  DRAINS: none   LOCAL MEDICATIONS USED:  MARCAINE     SPECIMEN:  Excision  DISPOSITION OF SPECIMEN:  PATHOLOGY  COUNTS:  YES  TOURNIQUET:  * No tourniquets in log *  DICTATION: .Other Dictation: Dictation Number [id number]  PLAN OF CARE: Discharge to home after PACU  PATIENT DISPOSITION:  PACU - hemodynamically stable.   Delay start of Pharmacological VTE agent (>24hrs) due to surgical blood loss or risk of bleeding: yes  Earnstine Regal, MD, [facility] Office: [phone]

## 2015-09-26 NOTE — Anesthesia Postprocedure Evaluation (Signed)
  Anesthesia Post-op Note  Patient: [name] [name]  Procedure(s) Performed: Procedure(s): PARATHYROIDECTOMY (N/A)  Patient Location: PACU  Anesthesia Type:General  Level of Consciousness: awake, alert  and oriented  Airway and Oxygen Therapy: Patient Spontanous Breathing  Post-op Pain: mild  Post-op Assessment: Post-op Vital signs reviewed and Patient's Cardiovascular Status Stable              Post-op Vital Signs: Reviewed and stable  Last Vitals:  Filed Vitals:   09/26/15 1439  BP: 117/57  Pulse: 71  Temp: 36.6 C  Resp: 15    Complications: No apparent anesthesia complications

## 2015-09-26 NOTE — Interval H&P Note (Signed)
History and Physical Interval Note:  09/26/2015 11:36 AM  [name] [name]  has presented today for surgery, with the diagnosis of Primary Hyperparathyroidism.  The various methods of treatment have been discussed with the patient and family. After consideration of risks, benefits and other options for treatment, the patient has consented to    Procedure(s): PARATHYROIDECTOMY (N/A) as a surgical intervention .    The patient's history has been reviewed, patient examined, no change in status, stable for surgery.  I have reviewed the patient's chart and labs.  Questions were answered to the patient's satisfaction.    Earnstine Regal, MD, [facility] Office: [place]

## 2015-10-04 NOTE — Progress Notes (Signed)
HPI: FU CAD, s/p anterior MI in 1984 treated with thrombolytics. Also with PVD. 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 She underwent kissing stent placement to the bilateral CIA extending into the distal aorta. Procedure was c/b by small non-flow limiting dissection involving L EIA and tiny dissection of distal aorta that was not flow limiting. These were treated medically. Since last seen,  Studies:  - Echo in 5/08: Normal LV function, distal septal HK, impaired LV relaxation, mild TR.  - Myoview 12/15: prior apical infarct with partial reversibility., EF 58%. Treated medically - LHC 05/11/13: Proximal LAD 30%, mid LAD 30%, EF 55-60%, apical inferior HK. Overall, her LAD was heavily calcified but she had no flow limiting stenosis. Abnormal nuclear study was felt to be related to prior anterior MI. Continued medical therapy recommended.  - Abdominal US (02/2014): No AAA - ABIs June 2016 normal.  Current Outpatient Prescriptions  Medication Sig Dispense Refill  . acetaminophen (TYLENOL) 325 MG tablet Take 650 mg by mouth every 6 (six) hours as needed for mild pain.    Marland Kitchen ALPRAZolam (XANAX) 0.5 MG tablet Take 0.5-1 mg by mouth 2 (two) times daily. Take 1 tablet in the morning and 2 tablets at night    . aspirin EC 81 MG tablet Take 81 mg by mouth daily.    Marland Kitchen atorvastatin (LIPITOR) 40 MG tablet TAKE 1 TABLET BY MOUTH EVERY DAY 90 tablet 1  . diltiazem (CARDIZEM CD) 120 MG 24 hr capsule Take 1 capsule (120 mg total) by mouth at bedtime. 90 capsule 3  . ezetimibe (ZETIA) 10 MG tablet Take 1 tablet (10 mg total) by mouth daily. 90 tablet 3  . FLUoxetine (PROZAC) 20 MG capsule Take 20 mg by mouth daily with breakfast.     . HYDROcodone-acetaminophen (NORCO/VICODIN) 5-325 MG tablet Take 1-2 tablets by mouth every 4 (four) hours as needed for moderate pain. 20 tablet 0  . ibuprofen  (ADVIL,MOTRIN) 200 MG tablet Take 200 mg by mouth every 6 (six) hours as needed (joint pain and inflammation).     . isometheptene-acetaminophen-dichloralphenazone (MIDRIN) 65-325-100 MG capsule Take 1 capsule by mouth 4 (four) times daily as needed for migraine.     Marland Kitchen losartan (COZAAR) 25 MG tablet Take 1 tablet (25 mg total) by mouth daily. 90 tablet 3  . metFORMIN (GLUCOPHAGE) 1000 MG tablet Take 1 tablet (1,000 mg total) by mouth 2 (two) times daily.    . metoprolol succinate (TOPROL-XL) 25 MG 24 hr tablet Take 1 tablet (25 mg total) by mouth daily. 90 tablet 3  . nitroGLYCERIN (NITROSTAT) 0.4 MG SL tablet Place 0.4 mg under the tongue every 5 (five) minutes as needed for chest pain.    Marland Kitchen omeprazole (PRILOSEC) 20 MG capsule Take 20 mg by mouth daily as needed (heart burn).     . Oxycodone HCl 10 MG TABS TAKE 1/2-1 TABLET BY ORAL ROUTE EVERY 8-12 HOURS, 30 DAY SUPPLY, DNF 09/20/15  0  . SYNTHROID 125 MCG tablet Take 125 mcg by mouth daily.  3  . Vitamin D, Ergocalciferol, (DRISDOL) 50000 UNITS CAPS capsule Take 50,000 Units by mouth once a week.  2   No current facility-administered medications for this visit.     Past Medical History  Diagnosis Date  . Ischemic heart disease   . Obesity   . Chronic anxiety   . Lumbar herniated  disc   . Depression   . Situational stress   . Hyperlipemia   . Hypothyroidism   . Coronary artery disease CARDIOLOGIST- DR Vidal Schwalbe-- VISIT 04-01-2011 W/ CHART AND IN EPIC  . Right ureteral calculus   . 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  NO ISCHEMIA. SUSPECT THIS REPRESENTS PRIOR MI BUT PATTERN UNUSUAL GIVEN WORSE DEFECT AT REST. APICAL HYPODINESIS BUT OVERALL PRESERVED EF.  Marland Kitchen History of hypercalcemia   . History of echocardiogram 05-20-2007    NORMAL LVSF. DISTAL SEPTAL HYPOKINESIA. MILD LEVH W/ IMPAIRED LV RELAXATION. MILD AORTIC SCLEROSIS. MILD  TRICUSPID REGURGITATION  . Arthritis   . GERD (gastroesophageal reflux disease)   . PONV (postoperative nausea and vomiting)   . Panic attack as reaction to stress   . Female bladder prolapse   . Urge urinary incontinence   . Nephrolithiasis     "chronic" (03/07/2014)  . Family history of anesthesia complication     "sister had PONV" (03/07/2014)  . Pneumonia   . COPD (chronic obstructive pulmonary disease) ([place])   . 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 Type II diabetes mellitus ([place])   . Daily headache   . Migraine     "frequently; have had them since I was very young"  . PAD (peripheral artery disease) ([place])   . Hypertension   . Anginal pain (HCC)     LAST TOOK NTG 2 DAYS AGO  . History of gout     30 YRS AGO  . Fibromyalgia     Past Surgical History  Procedure Laterality Date  . Left ureteroscopic laser litho/ stone extraction  12-16-2009  &  11-17-2007  . Right ureteroscopic stone extraction   07-04-2008  . Carpal tunnel release Right 2005  . Extracorporeal shock wave lithotripsy  2007    X2  . Facial cosmetic surgery  ?2003; ?2007  . Ureteroscopy  01/25/2012    Procedure: URETEROSCOPY;  Surgeon: Claybon Jabs, MD;  Location: [facility];  Service: Urology;  Laterality: Right;  . Coronary angioplasty  1984  . Cardiac catheterization  04-18-2008; 05-08-2004;  11-29-1986; 08-19-1988    2009 REPORT---- MILD ANTERIOR HYPOKINESIS (FROM REMOTE MI), MILD CORONARY ATHEROSCLEROSIS W/ 30-40% NARROWING IN THE MID LAD & 50% NARROWING IN THE SMALL OBTUSE MARGINAL,  NORMAL RENAL ARTERIES W/ NO ABD. AORTIC DISEASE  . Iliac artery stent Bilateral 03/07/2014  . Tonsillectomy and adenoidectomy  1950's  . Cataract extraction w/ intraocular lens  implant, bilateral Bilateral 2013  . Dilation and curettage of uterus    . Abdominal angiogram N/A 03/07/2014    Procedure: ABDOMINAL ANGIOGRAM;  Surgeon: Wellington Hampshire, MD;   Location: [facility] CATH LAB;  Service: Cardiovascular;  Laterality: N/A;  . Percutaneous stent intervention Bilateral 03/07/2014    Procedure: PERCUTANEOUS STENT INTERVENTION;  Surgeon: Wellington Hampshire, MD;  Location: [facility] CATH LAB;  Service: Cardiovascular;  Laterality: Bilateral;  bilat iliac stents  . Lower extremity angiogram Bilateral 03/07/2014    Procedure: LOWER EXTREMITY ANGIOGRAM;  Surgeon: Wellington Hampshire, MD;  Location: [place] CATH LAB;  Service: Cardiovascular;  Laterality: Bilateral;  . Parathyroidectomy N/A 09/26/2015    Procedure: PARATHYROIDECTOMY;  Surgeon: Armandina Gemma, MD;  Location: [facility];  Service: General;  Laterality: N/A;    Social History   Social History  . Marital Status: Married    Spouse  Name: N/A  . Number of Children: N/A  . Years of Education: N/A   Occupational History  . Not on file.   Social History Main Topics  . Smoking status: Former Smoker -- 1.00 packs/day for 17 years    Types: Cigarettes    Quit date: 12/28/1982  . Smokeless tobacco: Never Used  . Alcohol Use: Yes     Comment: "NOT NOW"  . Drug Use: No  . Sexual Activity: No   Other Topics Concern  . Not on file   Social History Narrative    ROS: no fevers or chills, productive cough, hemoptysis, dysphasia, odynophagia, melena, hematochezia, dysuria, hematuria, rash, seizure activity, orthopnea, PND, pedal edema, claudication. Remaining systems are negative.  Physical Exam: Well-developed well-nourished in no acute distress.  Skin is warm and dry.  HEENT is normal.  Neck is supple.  Chest is clear to auscultation with normal expansion.  Cardiovascular exam is regular rate and rhythm.  Abdominal exam nontender or distended. No masses palpated. Extremities show no edema. neuro grossly intact  ECG     This encounter was created in error - please disregard.

## 2015-10-08 ENCOUNTER — Encounter: Payer: Medicare Other | Admitting: Cardiology

## 2015-10-17 ENCOUNTER — Encounter (HOSPITAL_COMMUNITY): Payer: Self-pay | Admitting: *Deleted

## 2015-10-17 ENCOUNTER — Emergency Department (HOSPITAL_COMMUNITY)
Admission: EM | Admit: 2015-10-17 | Discharge: 2015-10-17 | Disposition: A | Discharge status: Home / Self Care | Payer: Medicare Other | Attending: Emergency Medicine | Admitting: Emergency Medicine

## 2015-10-17 ENCOUNTER — Emergency Department (HOSPITAL_COMMUNITY): Payer: Medicare Other

## 2015-10-17 DIAGNOSIS — Y9389 Activity, other specified: Secondary | ICD-10-CM | POA: Diagnosis not present

## 2015-10-17 DIAGNOSIS — E669 Obesity, unspecified: Secondary | ICD-10-CM | POA: Insufficient documentation

## 2015-10-17 DIAGNOSIS — W01198A Fall on same level from slipping, tripping and stumbling with subsequent striking against other object, initial encounter: Secondary | ICD-10-CM | POA: Diagnosis not present

## 2015-10-17 DIAGNOSIS — Z79899 Other long term (current) drug therapy: Secondary | ICD-10-CM | POA: Diagnosis not present

## 2015-10-17 DIAGNOSIS — E119 Type 2 diabetes mellitus without complications: Secondary | ICD-10-CM | POA: Diagnosis not present

## 2015-10-17 DIAGNOSIS — I10 Essential (primary) hypertension: Secondary | ICD-10-CM | POA: Diagnosis not present

## 2015-10-17 DIAGNOSIS — I25119 Atherosclerotic heart disease of native coronary artery with unspecified angina pectoris: Secondary | ICD-10-CM | POA: Diagnosis not present

## 2015-10-17 DIAGNOSIS — K219 Gastro-esophageal reflux disease without esophagitis: Secondary | ICD-10-CM | POA: Diagnosis not present

## 2015-10-17 DIAGNOSIS — J449 Chronic obstructive pulmonary disease, unspecified: Secondary | ICD-10-CM | POA: Insufficient documentation

## 2015-10-17 DIAGNOSIS — S8001XA Contusion of right knee, initial encounter: Secondary | ICD-10-CM | POA: Diagnosis not present

## 2015-10-17 DIAGNOSIS — S2232XA Fracture of one rib, left side, initial encounter for closed fracture: Secondary | ICD-10-CM | POA: Diagnosis not present

## 2015-10-17 DIAGNOSIS — E039 Hypothyroidism, unspecified: Secondary | ICD-10-CM | POA: Insufficient documentation

## 2015-10-17 DIAGNOSIS — Z8701 Personal history of pneumonia (recurrent): Secondary | ICD-10-CM | POA: Diagnosis not present

## 2015-10-17 DIAGNOSIS — I252 Old myocardial infarction: Secondary | ICD-10-CM | POA: Diagnosis not present

## 2015-10-17 DIAGNOSIS — S29001A Unspecified injury of muscle and tendon of front wall of thorax, initial encounter: Secondary | ICD-10-CM | POA: Diagnosis present

## 2015-10-17 DIAGNOSIS — Z87891 Personal history of nicotine dependence: Secondary | ICD-10-CM | POA: Insufficient documentation

## 2015-10-17 DIAGNOSIS — Z87442 Personal history of urinary calculi: Secondary | ICD-10-CM | POA: Diagnosis not present

## 2015-10-17 DIAGNOSIS — M1990 Unspecified osteoarthritis, unspecified site: Secondary | ICD-10-CM | POA: Insufficient documentation

## 2015-10-17 DIAGNOSIS — Y998 Other external cause status: Secondary | ICD-10-CM | POA: Insufficient documentation

## 2015-10-17 DIAGNOSIS — Z7982 Long term (current) use of aspirin: Secondary | ICD-10-CM | POA: Diagnosis not present

## 2015-10-17 DIAGNOSIS — F329 Major depressive disorder, single episode, unspecified: Secondary | ICD-10-CM | POA: Insufficient documentation

## 2015-10-17 DIAGNOSIS — Y9289 Other specified places as the place of occurrence of the external cause: Secondary | ICD-10-CM | POA: Insufficient documentation

## 2015-10-17 DIAGNOSIS — E785 Hyperlipidemia, unspecified: Secondary | ICD-10-CM | POA: Diagnosis not present

## 2015-10-17 MED ORDER — OXYCODONE-ACETAMINOPHEN 5-325 MG PO TABS: 1.0000 | ORAL_TABLET | Freq: Once | ORAL | Status: DC

## 2015-10-17 MED ORDER — MORPHINE SULFATE (PF) 4 MG/ML IV SOLN
4.0000 mg | Freq: Once | INTRAVENOUS | Status: AC
  Administered 2015-10-17: 4 mg via INTRAMUSCULAR
  Filled 2015-10-17: qty 1

## 2015-10-17 MED ORDER — OXYCODONE-ACETAMINOPHEN 5-325 MG PO TABS
1.0000 | ORAL_TABLET | Freq: Once | ORAL | Status: AC
  Administered 2015-10-17: 1 via ORAL
  Filled 2015-10-17 (×2): qty 1

## 2015-10-17 NOTE — ED Notes (Signed)
Pt remains in xray.

## 2015-10-17 NOTE — ED Provider Notes (Signed)
CSN: [id number]     Arrival date & time 10/17/15  1238 History   First MD Initiated Contact with Patient 10/17/15 1251     Chief Complaint  Patient presents with  . Fall     (Consider location/radiation/quality/duration/timing/severity/associated sxs/prior Treatment) HPI Comments: Patient here after suffering a fall just prior to arrival. She tripped over an object and fell onto her left side. She denies any preceding chest pain or being dizzy. Complains of right knee pain as well as left rib pain. Denies any head injury. No loss of consciousness. Denies any abdominal pain. She is not dyspneic. Pain characterized as sharp and worse with movements and better with remaining still. No treatment used prior to arrival.  Patient is a 69 y.o. female presenting with fall. The history is provided by the patient.  Fall    Past Medical History  Diagnosis Date  . Ischemic heart disease   . Obesity   . Chronic anxiety   . Lumbar herniated disc   . Depression   . Situational stress   . Hyperlipemia   . Hypothyroidism   . Coronary artery disease CARDIOLOGIST- DR Vidal Schwalbe-- VISIT 04-01-2011 W/ CHART AND IN EPIC  . Right ureteral calculus   . 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  NO ISCHEMIA. SUSPECT THIS REPRESENTS PRIOR MI BUT PATTERN UNUSUAL GIVEN WORSE DEFECT AT REST. APICAL HYPODINESIS BUT OVERALL PRESERVED EF.  Marland Kitchen History of hypercalcemia   . History of echocardiogram 05-20-2007    NORMAL LVSF. DISTAL SEPTAL HYPOKINESIA. MILD LEVH W/ IMPAIRED LV RELAXATION. MILD AORTIC SCLEROSIS. MILD TRICUSPID REGURGITATION  . Arthritis   . GERD (gastroesophageal reflux disease)   . PONV (postoperative nausea and vomiting)   . Panic attack as reaction to stress   . Female bladder prolapse   . Urge urinary incontinence   . Nephrolithiasis     "chronic" (03/07/2014)  . Family history of anesthesia  complication     "sister had PONV" (03/07/2014)  . Pneumonia   . COPD (chronic obstructive pulmonary disease) ([place])   . 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 Type II diabetes mellitus ([place])   . Daily headache   . Migraine     "frequently; have had them since I was very young"  . PAD (peripheral artery disease) ([place])   . Hypertension   . Anginal pain (HCC)     LAST TOOK NTG 2 DAYS AGO  . History of gout     30 YRS AGO  . Fibromyalgia    Past Surgical History  Procedure Laterality Date  . Left ureteroscopic laser litho/ stone extraction  12-16-2009  &  11-17-2007  . Right ureteroscopic stone extraction   07-04-2008  . Carpal tunnel release Right 2005  . Extracorporeal shock wave lithotripsy  2007    X2  . Facial cosmetic surgery  ?2003; ?2007  . Ureteroscopy  01/25/2012    Procedure: URETEROSCOPY;  Surgeon: Claybon Jabs, MD;  Location: [facility];  Service: Urology;  Laterality: Right;  . Coronary angioplasty  1984  . Cardiac catheterization  04-18-2008; 05-08-2004;  11-29-1986; 08-19-1988    2009 REPORT---- MILD ANTERIOR HYPOKINESIS (FROM REMOTE MI), MILD CORONARY ATHEROSCLEROSIS W/ 30-40% NARROWING IN THE MID LAD & 50% NARROWING IN THE SMALL OBTUSE MARGINAL,  NORMAL RENAL ARTERIES W/ NO ABD. AORTIC DISEASE  . Iliac artery stent  Bilateral 03/07/2014  . Tonsillectomy and adenoidectomy  1950's  . Cataract extraction w/ intraocular lens  implant, bilateral Bilateral 2013  . Dilation and curettage of uterus    . Abdominal angiogram N/A 03/07/2014    Procedure: ABDOMINAL ANGIOGRAM;  Surgeon: Wellington Hampshire, MD;  Location: [facility] CATH LAB;  Service: Cardiovascular;  Laterality: N/A;  . Percutaneous stent intervention Bilateral 03/07/2014    Procedure: PERCUTANEOUS STENT INTERVENTION;  Surgeon: Wellington Hampshire, MD;  Location: [place] CATH LAB;  Service: Cardiovascular;  Laterality: Bilateral;  bilat iliac stents  . Lower  extremity angiogram Bilateral 03/07/2014    Procedure: LOWER EXTREMITY ANGIOGRAM;  Surgeon: Wellington Hampshire, MD;  Location: [place] CATH LAB;  Service: Cardiovascular;  Laterality: Bilateral;  . Parathyroidectomy N/A 09/26/2015    Procedure: PARATHYROIDECTOMY;  Surgeon: Armandina Gemma, MD;  Location: [facility];  Service: General;  Laterality: N/A;   Family History  Problem Relation Age of Onset  . Heart disease Mother   . Heart attack Father   . Heart disease Brother    Social History  Substance Use Topics  . Smoking status: Former Smoker -- 1.00 packs/day for 17 years    Types: Cigarettes    Quit date: 12/28/1982  . Smokeless tobacco: Never Used  . Alcohol Use: Yes     Comment: "NOT NOW"   OB History    No data available     Review of Systems  All other systems reviewed and are negative.     Allergies  Cephalosporins  Home Medications   Prior to Admission medications   Medication Sig Start Date End Date Taking? Authorizing Provider  acetaminophen (TYLENOL) 325 MG tablet Take 650 mg by mouth every 6 (six) hours as needed for mild pain.    Historical Provider, MD  ALPRAZolam Duanne Moron) 0.5 MG tablet Take 0.5-1 mg by mouth 2 (two) times daily. Take 1 tablet in the morning and 2 tablets at night    Historical Provider, MD  aspirin EC 81 MG tablet Take 81 mg by mouth daily.    Historical Provider, MD  atorvastatin (LIPITOR) 40 MG tablet TAKE 1 TABLET BY MOUTH EVERY DAY 04/11/15   Lelon Perla, MD  diltiazem (CARDIZEM CD) 120 MG 24 hr capsule Take 1 capsule (120 mg total) by mouth at bedtime. 10/13/14   Lelon Perla, MD  ezetimibe (ZETIA) 10 MG tablet Take 1 tablet (10 mg total) by mouth daily. 10/13/14   Lelon Perla, MD  FLUoxetine (PROZAC) 20 MG capsule Take 20 mg by mouth daily with breakfast.     Historical Provider, MD  HYDROcodone-acetaminophen (NORCO/VICODIN) 5-325 MG tablet Take 1-2 tablets by mouth every 4 (four) hours as needed for moderate pain. 09/26/15   Armandina Gemma,  MD  ibuprofen (ADVIL,MOTRIN) 200 MG tablet Take 200 mg by mouth every 6 (six) hours as needed (joint pain and inflammation).     Historical Provider, MD  isometheptene-acetaminophen-dichloralphenazone (MIDRIN) 65-325-100 MG capsule Take 1 capsule by mouth 4 (four) times daily as needed for migraine.     Historical Provider, MD  losartan (COZAAR) 25 MG tablet Take 1 tablet (25 mg total) by mouth daily. 10/13/14   Lelon Perla, MD  metFORMIN (GLUCOPHAGE) 1000 MG tablet Take 1 tablet (1,000 mg total) by mouth 2 (two) times daily. 03/10/14   Erlene Quan, PA-C  metoprolol succinate (TOPROL-XL) 25 MG 24 hr tablet Take 1 tablet (25 mg total) by mouth daily. 10/22/14   Lelon Perla, MD  nitroGLYCERIN (  NITROSTAT) 0.4 MG SL tablet Place 0.4 mg under the tongue every 5 (five) minutes as needed for chest pain.    Historical Provider, MD  omeprazole (PRILOSEC) 20 MG capsule Take 20 mg by mouth daily as needed (heart burn).     Historical Provider, MD  Oxycodone HCl 10 MG TABS TAKE 1/2-1 TABLET BY ORAL ROUTE EVERY 8-12 HOURS, 30 DAY SUPPLY, DNF 09/20/15 09/21/15   Historical Provider, MD  SYNTHROID 125 MCG tablet Take 125 mcg by mouth daily. 04/28/15   Historical Provider, MD  Vitamin D, Ergocalciferol, (DRISDOL) 50000 UNITS CAPS capsule Take 50,000 Units by mouth once a week. 05/15/15   Historical Provider, MD   BP 116/76 mmHg  Pulse 57  Temp(Src) 97.2 F (36.2 C) (Oral)  Ht 5\' 8"  (1.727 m)  Wt 158 lb (71.668 kg)  BMI 24.03 kg/m2  SpO2 99% Physical Exam  Constitutional: She is oriented to person, place, and time. She appears well-developed and well-nourished.  Non-toxic appearance. No distress.  HENT:  Head: Normocephalic and atraumatic. Head is without abrasion, without contusion and without laceration.  Nose: Nose normal.  Mouth/Throat: Oropharynx is clear and moist and mucous membranes are normal.  Eyes: Conjunctivae and EOM are normal. Pupils are equal, round, and reactive to light.  Neck:  Trachea normal and normal range of motion. Neck supple. No spinous process tenderness and no muscular tenderness present. No tracheal deviation present.  Cardiovascular: Normal rate, regular rhythm, normal heart sounds and normal pulses.  Exam reveals no gallop.   No murmur heard. Pulmonary/Chest: Effort normal. No stridor. No respiratory distress. She has no decreased breath sounds. She has no wheezes. She exhibits bony tenderness. She exhibits no crepitus, no deformity and no swelling.    Abdominal: Soft. Normal appearance and bowel sounds are normal. She exhibits no distension. There is no tenderness. There is no rebound and no CVA tenderness.  Musculoskeletal: Normal range of motion. She exhibits no edema or tenderness.       Legs: Full range of motion at the knee with some ecchymosis.  Neurological: She is alert and oriented to person, place, and time. She has normal strength. No cranial nerve deficit or sensory deficit. GCS eye subscore is 4. GCS verbal subscore is 5. GCS motor subscore is 6.  Skin: Skin is warm and dry.  Psychiatric: She has a normal mood and affect. Her speech is normal and behavior is normal.  Nursing note and vitals reviewed.   ED Course  Procedures (including critical care time) Labs Review Labs Reviewed - No data to display  Imaging Review No results found. I have personally reviewed and evaluated these images and lab results as part of my medical decision-making.   EKG Interpretation None      MDM   Final diagnoses:  None    Patient with left-sided rib fracture noted and given pain medicine.  Stable for discharge    Lacretia Leigh, MD 10/17/15 1610

## 2015-10-17 NOTE — ED Notes (Signed)
Pt arrives from home rt a fall. Pt fell over a solar light that was in the flower bed and fell on the knees and possibly over to the left side. Pt has c/o left sided pain and has abrasions to the knees bilaterally.

## 2015-10-17 NOTE — ED Notes (Signed)
Pt is in stable condition upon d/c and is escorted from ED via wheelchair. 

## 2015-10-17 NOTE — Discharge Instructions (Signed)
Rib Fracture °A rib fracture is a break or crack in one of the bones of the ribs. The ribs are a group of long, curved bones that wrap around your chest and attach to your spine. They protect your lungs and other organs in the chest cavity. A broken or cracked rib is often painful, but most do not cause other problems. Most rib fractures heal on their own over time. However, rib fractures can be more serious if multiple ribs are broken or if broken ribs move out of place and push against other structures. °CAUSES  °· A direct blow to the chest. For example, this could happen during contact sports, a car accident, or a fall against a hard object. °· Repetitive movements with high force, such as pitching a baseball or having severe coughing spells. °SYMPTOMS  °· Pain when you breathe in or cough. °· Pain when someone presses on the injured area. °DIAGNOSIS  °Your caregiver will perform a physical exam. Various imaging tests may be ordered to confirm the diagnosis and to look for related injuries. These tests may include a chest X-ray, computed tomography (CT), magnetic resonance imaging (MRI), or a bone scan. °TREATMENT  °Rib fractures usually heal on their own in 1-3 months. The longer healing period is often associated with a continued cough or other aggravating activities. During the healing period, pain control is very important. Medication is usually given to control pain. Hospitalization or surgery may be needed for more severe injuries, such as those in which multiple ribs are broken or the ribs have moved out of place.  °HOME CARE INSTRUCTIONS  °· Avoid strenuous activity and any activities or movements that cause pain. Be careful during activities and avoid bumping the injured rib. °· Gradually increase activity as directed by your caregiver. °· Only take over-the-counter or prescription medications as directed by your caregiver. Do not take other medications without asking your caregiver first. °· Apply ice  to the injured area for the first 1-2 days after you have been treated or as directed by your caregiver. Applying ice helps to reduce inflammation and pain. °· Put ice in a plastic bag. °· Place a towel between your skin and the bag.   °· Leave the ice on for 15-20 minutes at a time, every 2 hours while you are awake. °· Perform deep breathing as directed by your caregiver. This will help prevent pneumonia, which is a common complication of a broken rib. Your caregiver may instruct you to: °· Take deep breaths several times a day. °· Try to cough several times a day, holding a pillow against the injured area. °· Use a device called an incentive spirometer to practice deep breathing several times a day. °· Drink enough fluids to keep your urine clear or pale yellow. This will help you avoid constipation.   °· Do not wear a rib belt or binder. These restrict breathing, which can lead to pneumonia.   °SEEK IMMEDIATE MEDICAL CARE IF:  °· You have a fever.   °· You have difficulty breathing or shortness of breath.   °· You develop a continual cough, or you cough up thick or bloody sputum. °· You feel sick to your stomach (nausea), throw up (vomit), or have abdominal pain.   °· You have worsening pain not controlled with medications.   °MAKE SURE YOU: °· Understand these instructions. °· Will watch your condition. °· Will get help right away if you are not doing well or get worse. °  °This information is not intended to   replace advice given to you by your health care provider. Make sure you discuss any questions you have with your health care provider. °  °Document Released: 12/14/2005 Document Revised: 08/16/2013 Document Reviewed: 02/15/2013 °Elsevier Interactive Patient Education ©2016 [employer] °Contusion °A contusion is a deep bruise. Contusions are the result of a blunt injury to tissues and muscle fibers under the skin. The injury causes bleeding under the skin. The skin overlying the contusion may turn blue,  purple, or yellow. Minor injuries will give you a painless contusion, but more severe contusions may stay painful and swollen for a few weeks.  °CAUSES  °This condition is usually caused by a blow, trauma, or direct force to an area of the body. °SYMPTOMS  °Symptoms of this condition include: °· Swelling of the injured area. °· Pain and tenderness in the injured area. °· Discoloration. The area may have redness and then turn blue, purple, or yellow. °DIAGNOSIS  °This condition is diagnosed based on a physical exam and medical history. An X-ray, CT scan, or MRI may be needed to determine if there are any associated injuries, such as broken bones (fractures). °TREATMENT  °Specific treatment for this condition depends on what area of the body was injured. In general, the best treatment for a contusion is resting, icing, applying pressure to (compression), and elevating the injured area. This is often called the RICE strategy. Over-the-counter anti-inflammatory medicines may also be recommended for pain control.  °HOME CARE INSTRUCTIONS  °· Rest the injured area. °· If directed, apply ice to the injured area: °¨ Put ice in a plastic bag. °¨ Place a towel between your skin and the bag. °¨ Leave the ice on for 20 minutes, 2-3 times per day. °· If directed, apply light compression to the injured area using an elastic bandage. Make sure the bandage is not wrapped too tightly. Remove and reapply the bandage as directed by your health care provider. °· If possible, raise (elevate) the injured area above the level of your heart while you are sitting or lying down. °· Take over-the-counter and prescription medicines only as told by your health care provider. °SEEK MEDICAL CARE IF: °· Your symptoms do not improve after several days of treatment. °· Your symptoms get worse. °· You have difficulty moving the injured area. °SEEK IMMEDIATE MEDICAL CARE IF:  °· You have severe pain. °· You have numbness in a hand or foot. °· Your  hand or foot turns pale or cold. °  °This information is not intended to replace advice given to you by your health care provider. Make sure you discuss any questions you have with your health care provider. °  °Document Released: 09/23/2005 Document Revised: 09/04/2015 Document Reviewed: 05/01/2015 °Elsevier Interactive Patient Education ©2016 Elsevier Inc. ° °

## 2015-10-30 ENCOUNTER — Other Ambulatory Visit: Payer: Self-pay | Admitting: Cardiovascular Disease

## 2015-10-30 MED ORDER — DILTIAZEM HCL ER COATED BEADS 120 MG PO CP24
120.0000 mg | ORAL_CAPSULE | Freq: Every day | ORAL | Status: DC
Start: 2015-10-30 — End: 2016-05-05

## 2015-10-30 MED ORDER — EZETIMIBE 10 MG PO TABS: 10.0000 mg | ORAL_TABLET | Freq: Every day | ORAL | Status: DC

## 2015-11-04 ENCOUNTER — Other Ambulatory Visit: Payer: Self-pay | Admitting: *Deleted

## 2015-11-04 MED ORDER — LOSARTAN POTASSIUM 25 MG PO TABS: 25.0000 mg | ORAL_TABLET | Freq: Every day | ORAL | Status: DC

## 2015-11-04 MED ORDER — METOPROLOL SUCCINATE ER 25 MG PO TB24: 25.0000 mg | ORAL_TABLET | Freq: Every day | ORAL | Status: DC

## 2015-11-05 ENCOUNTER — Other Ambulatory Visit: Payer: Self-pay

## 2015-11-05 MED ORDER — NITROGLYCERIN 0.4 MG SL SUBL: 0.4000 mg | SUBLINGUAL_TABLET | SUBLINGUAL | Status: AC | PRN

## 2015-12-02 ENCOUNTER — Ambulatory Visit: Payer: Medicare Other | Admitting: Cardiology

## 2016-01-01 NOTE — Progress Notes (Signed)
HPI: FU CAD, s/p anterior MI in 1984 treated with thrombolytics and POBA. Also with PVD. 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 She underwent kissing stent placement to the bilateral CIA extending into the distal aorta. Procedure was c/b by small non-flow limiting dissection involving L EIA and tiny dissection of distal aorta that was not flow limiting. These were treated medically. ABIs June 2016 normal. Since last seen, Patient is very anxious and depressed today. She lost her husband recently. She denies dyspnea. She does have chest pain that begins in her back. She has multiple other complaints including cold sensation in her upper extremities and problems with significant back pain. Studies:  - Echo in 5/08: Normal LV function, distal septal HK, impaired LV relaxation, mild TR.  - Myoview 12/14/14: Ejection fraction 58%. There was a large infarct involving the apex with partial reversibility. No change compared to previous. - LHC 05/11/13: Proximal LAD 30%, mid LAD 30%, EF 55-60%, apical inferior HK. Overall, her LAD was heavily calcified but she had no flow limiting stenosis. Abnormal nuclear study was felt to be related to prior anterior MI. Continued medical therapy recommended.  - Abdominal US (02/2014): No AAA  Current Outpatient Prescriptions  Medication Sig Dispense Refill  . ALPRAZolam (XANAX) 0.5 MG tablet Take 0.5-1 mg by mouth 2 (two) times daily. Take 1 tablet in the morning and 2 tablets at night    . aspirin EC 81 MG tablet Take 81 mg by mouth daily.    Marland Kitchen atorvastatin (LIPITOR) 40 MG tablet TAKE 1 TABLET BY MOUTH EVERY DAY 90 tablet 1  . diltiazem (CARDIZEM CD) 120 MG 24 hr capsule Take 1 capsule (120 mg total) by mouth at bedtime. 90 capsule 1  . diphenhydrAMINE (SOMINEX) 25 MG tablet Take 25 mg by mouth at bedtime as needed for sleep.    Marland Kitchen ezetimibe (ZETIA)  10 MG tablet Take 1 tablet (10 mg total) by mouth daily. 90 tablet 1  . FLUoxetine (PROZAC) 20 MG capsule Take 20 mg by mouth daily with breakfast.     . fluticasone (FLONASE) 50 MCG/ACT nasal spray Place 1 spray into both nostrils daily.    Marland Kitchen HYDROcodone-acetaminophen (NORCO/VICODIN) 5-325 MG tablet Take 1-2 tablets by mouth every 4 (four) hours as needed for moderate pain. 20 tablet 0  . ibuprofen (ADVIL,MOTRIN) 200 MG tablet Take 200 mg by mouth every 6 (six) hours as needed (joint pain and inflammation).     . isometheptene-acetaminophen-dichloralphenazone (MIDRIN) 65-325-100 MG capsule Take 1 capsule by mouth 4 (four) times daily as needed for migraine.     Marland Kitchen losartan (COZAAR) 25 MG tablet Take 1 tablet (25 mg total) by mouth daily. 90 tablet 3  . metFORMIN (GLUCOPHAGE) 1000 MG tablet Take 1 tablet (1,000 mg total) by mouth 2 (two) times daily.    . metoprolol succinate (TOPROL-XL) 25 MG 24 hr tablet Take 1 tablet (25 mg total) by mouth daily. 90 tablet 3  . nitroGLYCERIN (NITROSTAT) 0.4 MG SL tablet Place 1 tablet (0.4 mg total) under the tongue every 5 (five) minutes as needed for chest pain. 25 tablet 6  . omeprazole (PRILOSEC) 20 MG capsule Take 20 mg by mouth daily as needed (heart burn).     . Oxycodone HCl 10 MG TABS TAKE 1/2-1 TABLET BY ORAL ROUTE EVERY 8-12 HOURS, 30 DAY SUPPLY, DNF 09/20/15  0  .  oxyCODONE-acetaminophen (PERCOCET/ROXICET) 5-325 MG tablet Take 1-2 tablets by mouth once. 20 tablet 0  . simethicone (MYLICON) 0000000 MG chewable tablet Chew 125 mg by mouth every 6 (six) hours as needed for flatulence.    Marland Kitchen SYNTHROID 112 MCG tablet Take 1 tablet by mouth daily. Take 1 tab daily    . traMADol (ULTRAM) 50 MG tablet Take 50 mg by mouth every 8 (eight) hours as needed. for pain  1  . VENTOLIN HFA 108 (90 Base) MCG/ACT inhaler Inhale 1 puff into the lungs 2 (two) times a week. Use as needed    . Vitamin D, Ergocalciferol, (DRISDOL) 50000 UNITS CAPS capsule Take 50,000 Units by mouth  once a week.  2  . acetaminophen (TYLENOL) 325 MG tablet Take 650 mg by mouth every 6 (six) hours as needed for mild pain. Reported on 01/03/2016     No current facility-administered medications for this visit.     Past Medical History  Diagnosis Date  . Ischemic heart disease   . Obesity   . Chronic anxiety   . Lumbar herniated disc   . Depression   . Situational stress   . Hyperlipemia   . Hypothyroidism   . Coronary artery disease CARDIOLOGIST- DR Vidal Schwalbe-- VISIT 04-01-2011 W/ CHART AND IN EPIC  . Right ureteral calculus   . 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  NO ISCHEMIA. SUSPECT THIS REPRESENTS PRIOR MI BUT PATTERN UNUSUAL GIVEN WORSE DEFECT AT REST. APICAL HYPODINESIS BUT OVERALL PRESERVED EF.  Marland Kitchen History of hypercalcemia   . History of echocardiogram 05-20-2007    NORMAL LVSF. DISTAL SEPTAL HYPOKINESIA. MILD LEVH W/ IMPAIRED LV RELAXATION. MILD AORTIC SCLEROSIS. MILD TRICUSPID REGURGITATION  . Arthritis   . GERD (gastroesophageal reflux disease)   . PONV (postoperative nausea and vomiting)   . Panic attack as reaction to stress   . Female bladder prolapse   . Urge urinary incontinence   . Nephrolithiasis     "chronic" (03/07/2014)  . Family history of anesthesia complication     "sister had PONV" (03/07/2014)  . Pneumonia   . COPD (chronic obstructive pulmonary disease) ([place])   . 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 Type II diabetes mellitus ([place])   . Daily headache   . Migraine     "frequently; have had them since I was very young"  . PAD (peripheral artery disease) ([place])   . Hypertension   . Anginal pain ([facility])     LAST TOOK NTG 2 DAYS AGO  . History of gout     30 YRS AGO  . Fibromyalgia     Past Surgical History  Procedure Laterality Date  . Left ureteroscopic laser litho/ stone extraction  12-16-2009   &  11-17-2007  . Right ureteroscopic stone extraction   07-04-2008  . Carpal tunnel release Right 2005  . Extracorporeal shock wave lithotripsy  2007    X2  . Facial cosmetic surgery  ?2003; ?2007  . Ureteroscopy  01/25/2012    Procedure: URETEROSCOPY;  Surgeon: Claybon Jabs, MD;  Location: [facility];  Service: Urology;  Laterality: Right;  . Coronary angioplasty  1984  . Cardiac catheterization  04-18-2008; 05-08-2004;  11-29-1986; 08-19-1988    2009 REPORT---- MILD ANTERIOR HYPOKINESIS (FROM REMOTE MI), MILD CORONARY ATHEROSCLEROSIS W/ 30-40% NARROWING IN THE MID LAD & 50% NARROWING IN  THE SMALL OBTUSE MARGINAL,  NORMAL RENAL ARTERIES W/ NO ABD. AORTIC DISEASE  . Iliac artery stent Bilateral 03/07/2014  . Tonsillectomy and adenoidectomy  1950's  . Cataract extraction w/ intraocular lens  implant, bilateral Bilateral 2013  . Dilation and curettage of uterus    . Abdominal angiogram N/A 03/07/2014    Procedure: ABDOMINAL ANGIOGRAM;  Surgeon: Wellington Hampshire, MD;  Location: [facility] CATH LAB;  Service: Cardiovascular;  Laterality: N/A;  . Percutaneous stent intervention Bilateral 03/07/2014    Procedure: PERCUTANEOUS STENT INTERVENTION;  Surgeon: Wellington Hampshire, MD;  Location: [facility] CATH LAB;  Service: Cardiovascular;  Laterality: Bilateral;  bilat iliac stents  . Lower extremity angiogram Bilateral 03/07/2014    Procedure: LOWER EXTREMITY ANGIOGRAM;  Surgeon: Wellington Hampshire, MD;  Location: [facility] CATH LAB;  Service: Cardiovascular;  Laterality: Bilateral;  . Parathyroidectomy N/A 09/26/2015    Procedure: PARATHYROIDECTOMY;  Surgeon: Armandina Gemma, MD;  Location: [facility] ORS;  Service: General;  Laterality: N/A;    Social History   Social History  . Marital Status: Married    Spouse Name: N/A  . Number of Children: N/A  . Years of Education: N/A   Occupational History  . Not on file.   Social History Main Topics  . Smoking status: Former Smoker -- 1.00 packs/day for 17 years     Types: Cigarettes    Quit date: 12/28/1982  . Smokeless tobacco: Never Used  . Alcohol Use: Yes     Comment: "NOT NOW"  . Drug Use: No  . Sexual Activity: No   Other Topics Concern  . Not on file   Social History Narrative    ROS: Back pain but no fevers or chills, productive cough, hemoptysis, dysphasia, odynophagia, melena, hematochezia, dysuria, hematuria, rash, seizure activity, orthopnea, PND, pedal edema, claudication. Remaining systems are negative.  Physical Exam: Well-developed well-nourished in no acute distress.  Skin is warm and dry.  HEENT is normal.  Neck is supple.  Chest is clear to auscultation with normal expansion.  Cardiovascular exam is regular rate and rhythm.  Abdominal exam nontender or distended. No masses palpated. Extremities show no edema. neuro grossly intact  Electrocardiogram shows sinus rhythm at a rate of 63. Prior septal infarct cannot be excluded. No ST changes.

## 2016-01-03 ENCOUNTER — Encounter: Payer: Self-pay | Admitting: Cardiology

## 2016-01-03 ENCOUNTER — Ambulatory Visit (INDEPENDENT_AMBULATORY_CARE_PROVIDER_SITE_OTHER): Payer: Medicare Other | Admitting: Cardiology

## 2016-01-03 VITALS — BP 132/80 | HR 72 | Ht 60.0 in | Wt 152.4 lb

## 2016-01-03 DIAGNOSIS — I10 Essential (primary) hypertension: Secondary | ICD-10-CM

## 2016-01-03 DIAGNOSIS — I2583 Coronary atherosclerosis due to lipid rich plaque: Principal | ICD-10-CM

## 2016-01-03 DIAGNOSIS — I739 Peripheral vascular disease, unspecified: Secondary | ICD-10-CM

## 2016-01-03 DIAGNOSIS — I2510 Atherosclerotic heart disease of native coronary artery without angina pectoris: Secondary | ICD-10-CM

## 2016-01-03 DIAGNOSIS — I209 Angina pectoris, unspecified: Secondary | ICD-10-CM

## 2016-01-03 DIAGNOSIS — E785 Hyperlipidemia, unspecified: Secondary | ICD-10-CM

## 2016-01-03 MED ORDER — ATORVASTATIN CALCIUM 40 MG PO TABS
40.0000 mg | ORAL_TABLET | Freq: Every day | ORAL | Status: DC
Start: 2016-01-03 — End: 2017-01-29

## 2016-01-03 NOTE — Assessment & Plan Note (Signed)
Blood pressure controlled. Continue present medications. 

## 2016-01-03 NOTE — Assessment & Plan Note (Addendum)
Continue aspirin and resume statin. 

## 2016-01-03 NOTE — Patient Instructions (Addendum)
   Medication Instructions:   RESTART ATORVASTATIN 40 MG ONCE DAILY  LAB INSTRUCTIONS: Your physician recommends that you return for lab work in: 4 WEEKS=DO NOT EAT PRIOR TO LAB WORK  Testing/Procedures:  Your physician has requested that you have en exercise stress myoview. For further information please visit [email or web address]. Please follow instruction sheet, as given.    Follow-Up:  Your physician wants you to follow-up in: [place] will receive a reminder letter in the mail two months in advance. If you don't receive a letter, please call our office to schedule the follow-up appointment.   Any Other Special Instructions Will Be Listed Below (If Applicable).

## 2016-01-03 NOTE — Assessment & Plan Note (Addendum)
Patient has been off Lipitor to see if it would improve her pain.It did not. Resume at 40 mg daily. Check lipids and liver in 4 weeks.

## 2016-01-03 NOTE — Assessment & Plan Note (Signed)
Difficult to assess. Symptoms atypical. Electrocardiogram shows no acute ST changes. Plan stress nuclear study for risk stratification.

## 2016-01-08 NOTE — Addendum Note (Signed)
Addended by: Therisa Doyne on: 01/08/2016 08:28 AM   Modules accepted: Orders

## 2016-01-10 ENCOUNTER — Telehealth (HOSPITAL_COMMUNITY): Payer: Self-pay

## 2016-01-10 NOTE — Telephone Encounter (Signed)
Encounter complete. 

## 2016-01-14 ENCOUNTER — Encounter (HOSPITAL_COMMUNITY): Payer: Medicare (Managed Care)

## 2016-01-15 ENCOUNTER — Ambulatory Visit (HOSPITAL_COMMUNITY)
Admission: RE | Admit: 2016-01-15 | Discharge: 2016-01-15 | Disposition: A | Discharge status: Home / Self Care | Payer: Medicare Other | Source: Ambulatory Visit | Attending: Cardiovascular Disease | Admitting: Cardiovascular Disease

## 2016-01-15 DIAGNOSIS — R9439 Abnormal result of other cardiovascular function study: Secondary | ICD-10-CM | POA: Diagnosis not present

## 2016-01-15 DIAGNOSIS — E119 Type 2 diabetes mellitus without complications: Secondary | ICD-10-CM | POA: Insufficient documentation

## 2016-01-15 DIAGNOSIS — I10 Essential (primary) hypertension: Secondary | ICD-10-CM | POA: Diagnosis not present

## 2016-01-15 DIAGNOSIS — Z87891 Personal history of nicotine dependence: Secondary | ICD-10-CM | POA: Diagnosis not present

## 2016-01-15 DIAGNOSIS — I2583 Coronary atherosclerosis due to lipid rich plaque: Secondary | ICD-10-CM

## 2016-01-15 DIAGNOSIS — R079 Chest pain, unspecified: Secondary | ICD-10-CM | POA: Diagnosis not present

## 2016-01-15 DIAGNOSIS — I2510 Atherosclerotic heart disease of native coronary artery without angina pectoris: Secondary | ICD-10-CM | POA: Diagnosis not present

## 2016-01-15 DIAGNOSIS — R5383 Other fatigue: Secondary | ICD-10-CM | POA: Insufficient documentation

## 2016-01-15 DIAGNOSIS — Z8249 Family history of ischemic heart disease and other diseases of the circulatory system: Secondary | ICD-10-CM | POA: Diagnosis not present

## 2016-01-15 LAB — MYOCARDIAL PERFUSION IMAGING
CHL CUP MPHR: 150 {beats}/min
CHL CUP NUCLEAR SDS: 1
CHL CUP RESTING HR STRESS: 69 {beats}/min
CHL RATE OF PERCEIVED EXERTION: 17
CSEPEDS: 35 s
CSEPEW: 6.4 METS
CSEPHR: 87 %
Exercise duration (min): 5 min
LV sys vol: 31 mL
LVDIAVOL: 75 mL
NUC STRESS TID: 0.95
Peak HR: 131 {beats}/min
SRS: 15
SSS: 16

## 2016-01-15 MED ORDER — TECHNETIUM TC 99M SESTAMIBI GENERIC - CARDIOLITE
31.8000 | Freq: Once | INTRAVENOUS | Status: AC | PRN
  Administered 2016-01-15: 31.8 via INTRAVENOUS

## 2016-01-15 MED ORDER — TECHNETIUM TC 99M SESTAMIBI GENERIC - CARDIOLITE
10.3000 | Freq: Once | INTRAVENOUS | Status: AC | PRN
  Administered 2016-01-15: 10.3 via INTRAVENOUS

## 2016-02-03 LAB — LIPID PANEL
CHOL/HDL RATIO: 2.3 ratio (ref <=5.0)
Cholesterol: 109 mg/dL — ABNORMAL LOW (ref 125–200)
HDL: 48 mg/dL (ref >=46)
LDL CALC: 48 mg/dL (ref <130)
Triglycerides: 63 mg/dL (ref <150)
VLDL: 13 mg/dL (ref <30)

## 2016-02-03 LAB — HEPATIC FUNCTION PANEL
ALT: 10 U/L (ref 6–29)
AST: 14 U/L (ref 10–35)
Albumin: 3.7 g/dL (ref 3.6–5.1)
Alkaline Phosphatase: 65 U/L (ref 33–130)
BILIRUBIN DIRECT: 0.1 mg/dL (ref <=0.2)
BILIRUBIN INDIRECT: 0.3 mg/dL (ref 0.2–1.2)
Total Bilirubin: 0.4 mg/dL (ref 0.2–1.2)
Total Protein: 6.1 g/dL (ref 6.1–8.1)

## 2016-02-04 ENCOUNTER — Encounter: Payer: Self-pay | Admitting: *Deleted

## 2016-02-28 ENCOUNTER — Encounter: Payer: Self-pay | Admitting: Cardiology

## 2016-05-05 ENCOUNTER — Other Ambulatory Visit: Payer: Self-pay | Admitting: *Deleted

## 2016-05-05 DIAGNOSIS — E785 Hyperlipidemia, unspecified: Secondary | ICD-10-CM

## 2016-05-05 MED ORDER — DILTIAZEM HCL ER COATED BEADS 120 MG PO CP24: 120.0000 mg | ORAL_CAPSULE | Freq: Every day | ORAL | Status: DC

## 2016-05-05 MED ORDER — EZETIMIBE 10 MG PO TABS: 10.0000 mg | ORAL_TABLET | Freq: Every day | ORAL | Status: DC

## 2016-05-12 ENCOUNTER — Ambulatory Visit (HOSPITAL_BASED_OUTPATIENT_CLINIC_OR_DEPARTMENT_OTHER): Payer: Medicare (Managed Care)

## 2016-05-14 ENCOUNTER — Ambulatory Visit (HOSPITAL_BASED_OUTPATIENT_CLINIC_OR_DEPARTMENT_OTHER): Payer: Medicare Other | Attending: Physical Medicine and Rehabilitation | Admitting: Internal Medicine

## 2016-05-14 VITALS — Ht 68.0 in | Wt 148.0 lb

## 2016-05-14 DIAGNOSIS — G4733 Obstructive sleep apnea (adult) (pediatric): Secondary | ICD-10-CM | POA: Insufficient documentation

## 2016-05-17 DIAGNOSIS — G4733 Obstructive sleep apnea (adult) (pediatric): Secondary | ICD-10-CM

## 2016-05-17 NOTE — Procedures (Signed)
   Patient Name: [name] [name], [name] [name] Date: 05/14/2016 Gender: Female D.O.B: 1946/01/29 Age (years): 68 Referring Provider: Margaretha Sheffield Height (inches): 68 Interpreting Physician: Baird Lyons MD, ABSM Weight (lbs): 148 RPSGT: Ne[place]er, Holly BMI: 23 MRN: [id number] Neck Size: 13.00 CLINICAL INFORMATION Sleep Study Type: NPSG Indication for sleep study: OSA Epworth Sleepiness Score: 11  SLEEP STUDY TECHNIQUE As per the AASM Manual for the Scoring of Sleep and Associated Events v2.3 (April 2016) with a hypopnea requiring 4% desaturations. The channels recorded and monitored were frontal, central and occipital EEG, electrooculogram (EOG), submentalis EMG (chin), nasal and oral airflow, thoracic and abdominal wall motion, anterior tibialis EMG, snore microphone, electrocardiogram, and pulse oximetry.  MEDICATIONS Patient's medications include: charted for review Medications self-administered by patient during sleep study  METFORMIN, ALPRAZOLAM, ATORVASTATIN, LOSARTAN, DILTIAZEM HCL, ZETIA, OXYCODONE HCL  SLEEP ARCHITECTURE The study was initiated at 10:34:58 PM and ended at 4:48:50 AM. Sleep onset time was 23.4 minutes and the sleep efficiency was 81.6%. The total sleep time was 305.0 minutes. Stage REM latency was 94.5 minutes. The patient spent 3.44% of the night in stage N1 sleep, 80.49% in stage N2 sleep, 0.00% in stage N3 and 16.07% in REM. Alpha intrusion was absent. Supine sleep was 78.36%.  RESPIRATORY PARAMETERS The overall apnea/hypopnea index (AHI) was 21.0 per hour. There were 26 total apneas, including 26 obstructive, 0 central and 0 mixed apneas. There were 81 hypopneas and 12 RERAs. The AHI during Stage REM sleep was 24.5 per hour. AHI while supine was 26.9 per hour. The mean oxygen saturation was 93.76%. The minimum SpO2 during sleep was 85.00%. Moderate snoring was noted during this study.  CARDIAC DATA The 2 lead EKG demonstrated sinus rhythm. The mean  heart rate was 66.18 beats per minute. Other EKG findings include: None.  LEG MOVEMENT DATA The total PLMS were 0 with a resulting PLMS index of 0.00. Associated arousal with leg movement index was 0.0 .  IMPRESSIONS - Moderate obstructive sleep apnea occurred during this study (AHI = 21.0/h). - No significant central sleep apnea occurred during this study (CAI = 0.0/h). - Mild oxygen desaturation was noted during this study (Min O2 = 85.00%). - The patient snored with Moderate snoring volume. - No cardiac abnormalities were noted during this study. - Clinically significant periodic limb movements did not occur during sleep. No significant associated arousals.  DIAGNOSIS - Obstructive Sleep Apnea (327.23 [G47.33 ICD-10])  RECOMMENDATIONS - Therapeutic CPAP titration to determine optimal pressure required to alleviate sleep disordered breathing. - Positional therapy avoiding supine position during sleep. - Avoid alcohol, sedatives and other CNS depressants that may worsen sleep apnea and disrupt normal sleep architecture. - Sleep hygiene should be reviewed to assess factors that may improve sleep quality. - Weight management and regular exercise should be initiated or continued if appropriate.  Deneise Lever Diplomate, American Board of Sleep Medicine  ELECTRONICALLY SIGNED ON:  05/17/2016, 2:06 PM [place] PH: [phone]   FX: [phone] [place]

## 2016-05-29 ENCOUNTER — Other Ambulatory Visit (HOSPITAL_COMMUNITY): Payer: Medicare (Managed Care)

## 2016-05-29 ENCOUNTER — Encounter (HOSPITAL_COMMUNITY): Payer: Medicare (Managed Care)

## 2016-06-08 ENCOUNTER — Encounter: Payer: Self-pay | Admitting: Internal Medicine

## 2016-06-08 ENCOUNTER — Other Ambulatory Visit: Payer: Self-pay | Admitting: Cardiovascular Disease

## 2016-06-08 DIAGNOSIS — I739 Peripheral vascular disease, unspecified: Secondary | ICD-10-CM

## 2016-06-12 ENCOUNTER — Ambulatory Visit (HOSPITAL_COMMUNITY)
Admission: RE | Admit: 2016-06-12 | Discharge: 2016-06-12 | Disposition: A | Discharge status: Home / Self Care | Payer: Medicare Other | Source: Ambulatory Visit | Attending: Cardiovascular Disease | Admitting: Cardiovascular Disease

## 2016-06-12 DIAGNOSIS — F329 Major depressive disorder, single episode, unspecified: Secondary | ICD-10-CM | POA: Diagnosis not present

## 2016-06-12 DIAGNOSIS — I10 Essential (primary) hypertension: Secondary | ICD-10-CM | POA: Insufficient documentation

## 2016-06-12 DIAGNOSIS — J449 Chronic obstructive pulmonary disease, unspecified: Secondary | ICD-10-CM | POA: Diagnosis not present

## 2016-06-12 DIAGNOSIS — I739 Peripheral vascular disease, unspecified: Secondary | ICD-10-CM

## 2016-06-12 DIAGNOSIS — K219 Gastro-esophageal reflux disease without esophagitis: Secondary | ICD-10-CM | POA: Insufficient documentation

## 2016-06-12 DIAGNOSIS — I2510 Atherosclerotic heart disease of native coronary artery without angina pectoris: Secondary | ICD-10-CM | POA: Insufficient documentation

## 2016-06-12 DIAGNOSIS — E785 Hyperlipidemia, unspecified: Secondary | ICD-10-CM | POA: Insufficient documentation

## 2016-06-12 DIAGNOSIS — E119 Type 2 diabetes mellitus without complications: Secondary | ICD-10-CM | POA: Insufficient documentation

## 2016-06-12 DIAGNOSIS — F419 Anxiety disorder, unspecified: Secondary | ICD-10-CM | POA: Insufficient documentation

## 2016-07-06 NOTE — Progress Notes (Signed)
HPI: FU CAD, s/p anterior MI in 1984 treated with thrombolytics and POBA. Also with PVD. 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 She underwent kissing stent placement to the bilateral CIA extending into the distal aorta. Procedure was c/b by small non-flow limiting dissection involving L EIA and tiny dissection of distal aorta that was not flow limiting. These were treated medically. Since last seen, the patient denies any dyspnea on exertion, orthopnea, PND, pedal edema, palpitations, syncope or chest pain.  Studies:  - Echo in 5/08: Normal LV function, distal septal HK, impaired LV relaxation, mild TR.  - Myoview 1/17: Ejection fraction 58%, prior anterior and apical infarct but no ischemia. - LHC 05/11/13: Proximal LAD 30%, mid LAD 30%, EF 55-60%, apical inferior HK. Overall, her LAD was heavily calcified but she had no flow limiting stenosis. Abnormal nuclear study was felt to be related to prior anterior MI. Continued medical therapy recommended.  - Abdominal US (6/17): No AAA - ABIs 6/17 normal  Current Outpatient Prescriptions  Medication Sig Dispense Refill  . acetaminophen (TYLENOL) 325 MG tablet Take 650 mg by mouth every 6 (six) hours as needed for mild pain. Reported on 01/03/2016    . ALPRAZolam (XANAX) 0.5 MG tablet Take 0.5-1 mg by mouth 2 (two) times daily. Take 1 tablet in the morning and 2 tablets at night    . aspirin EC 81 MG tablet Take 81 mg by mouth daily.    Marland Kitchen atorvastatin (LIPITOR) 40 MG tablet Take 1 tablet (40 mg total) by mouth daily. 90 tablet 3  . diltiazem (CARDIZEM CD) 120 MG 24 hr capsule Take 1 capsule (120 mg total) by mouth at bedtime. 90 capsule 2  . diphenhydrAMINE (SOMINEX) 25 MG tablet Take 25 mg by mouth at bedtime as needed for sleep.    Marland Kitchen ezetimibe (ZETIA) 10 MG tablet Take 1 tablet (10 mg total) by mouth daily. 90 tablet 2  .  FLUoxetine (PROZAC) 20 MG capsule Take 20 mg by mouth daily with breakfast.     . fluticasone (FLONASE) 50 MCG/ACT nasal spray Place 1 spray into both nostrils daily.    Marland Kitchen ibuprofen (ADVIL,MOTRIN) 200 MG tablet Take 200 mg by mouth every 6 (six) hours as needed (joint pain and inflammation).     . isometheptene-acetaminophen-dichloralphenazone (MIDRIN) 65-325-100 MG capsule Take 1 capsule by mouth 4 (four) times daily as needed for migraine.     Marland Kitchen losartan (COZAAR) 25 MG tablet Take 1 tablet (25 mg total) by mouth daily. 90 tablet 3  . metFORMIN (GLUCOPHAGE) 1000 MG tablet Take 1 tablet (1,000 mg total) by mouth 2 (two) times daily.    . metoprolol succinate (TOPROL-XL) 25 MG 24 hr tablet Take 1 tablet (25 mg total) by mouth daily. (Patient taking differently: Take 12.5 mg by mouth daily. ) 90 tablet 3  . nitroGLYCERIN (NITROSTAT) 0.4 MG SL tablet Place 1 tablet (0.4 mg total) under the tongue every 5 (five) minutes as needed for chest pain. 25 tablet 6  . omeprazole (PRILOSEC) 20 MG capsule Take 20 mg by mouth daily as needed (heart burn).     . Oxycodone HCl 10 MG TABS TAKE 1/2-1 TABLET BY ORAL ROUTE EVERY 8-12 HOURS, 30 DAY SUPPLY, DNF 09/20/15  0  . oxyCODONE-acetaminophen (PERCOCET/ROXICET) 5-325 MG tablet Take 1-2 tablets by mouth once. 20 tablet 0  . simethicone (MYLICON) 0000000 MG  chewable tablet Chew 125 mg by mouth every 6 (six) hours as needed for flatulence.    Marland Kitchen SYNTHROID 112 MCG tablet Take 1 tablet by mouth daily. Take 1 tab daily    . traMADol (ULTRAM) 50 MG tablet Take 50 mg by mouth every 8 (eight) hours as needed. for pain  1  . VENTOLIN HFA 108 (90 Base) MCG/ACT inhaler Inhale 1 puff into the lungs 2 (two) times a week. Use as needed    . Vitamin D, Ergocalciferol, (DRISDOL) 50000 UNITS CAPS capsule Take 50,000 Units by mouth once a week.  2   No current facility-administered medications for this visit.     Past Medical History  Diagnosis Date  . Ischemic heart disease   .  Obesity   . Chronic anxiety   . Lumbar herniated disc   . Depression   . Situational stress   . Hyperlipemia   . Hypothyroidism   . Coronary artery disease CARDIOLOGIST- DR Vidal Schwalbe-- VISIT 04-01-2011 W/ CHART AND IN EPIC  . Right ureteral calculus   . 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  NO ISCHEMIA. SUSPECT THIS REPRESENTS PRIOR MI BUT PATTERN UNUSUAL GIVEN WORSE DEFECT AT REST. APICAL HYPODINESIS BUT OVERALL PRESERVED EF.  Marland Kitchen History of hypercalcemia   . History of echocardiogram 05-20-2007    NORMAL LVSF. DISTAL SEPTAL HYPOKINESIA. MILD LEVH W/ IMPAIRED LV RELAXATION. MILD AORTIC SCLEROSIS. MILD TRICUSPID REGURGITATION  . Arthritis   . GERD (gastroesophageal reflux disease)   . PONV (postoperative nausea and vomiting)   . Panic attack as reaction to stress   . Female bladder prolapse   . Urge urinary incontinence   . Nephrolithiasis     "chronic" (03/07/2014)  . Family history of anesthesia complication     "sister had PONV" (03/07/2014)  . Pneumonia   . COPD (chronic obstructive pulmonary disease) ([place])   . 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 Type II diabetes mellitus ([place])   . Daily headache   . Migraine     "frequently; have had them since I was very young"  . PAD (peripheral artery disease) ([place])   . Hypertension   . Anginal pain (HCC)     LAST TOOK NTG 2 DAYS AGO  . History of gout     30 YRS AGO  . Fibromyalgia     Past Surgical History  Procedure Laterality Date  . Left ureteroscopic laser litho/ stone extraction  12-16-2009  &  11-17-2007  . Right ureteroscopic stone extraction   07-04-2008  . Carpal tunnel release Right 2005  . Extracorporeal shock wave lithotripsy  2007    X2  . Facial cosmetic surgery  ?2003; ?2007  . Ureteroscopy  01/25/2012    Procedure: URETEROSCOPY;  Surgeon: Claybon Jabs, MD;   [facility]: [facility];  Service: Urology;  Laterality: Right;  . Coronary angioplasty  1984  . Cardiac catheterization  04-18-2008; 05-08-2004;  11-29-1986; 08-19-1988    2009 REPORT---- MILD ANTERIOR HYPOKINESIS (FROM REMOTE MI), MILD CORONARY ATHEROSCLEROSIS W/ 30-40% NARROWING IN THE MID LAD & 50% NARROWING IN THE SMALL OBTUSE MARGINAL,  NORMAL RENAL ARTERIES W/ NO ABD. AORTIC DISEASE  . Iliac artery stent Bilateral 03/07/2014  . Tonsillectomy and adenoidectomy  1950's  . Cataract extraction w/ intraocular lens  implant, bilateral Bilateral 2013  . Dilation and curettage of uterus    .  Abdominal angiogram N/A 03/07/2014    Procedure: ABDOMINAL ANGIOGRAM;  Surgeon: Wellington Hampshire, MD;  Location: [facility] CATH LAB;  Service: Cardiovascular;  Laterality: N/A;  . Percutaneous stent intervention Bilateral 03/07/2014    Procedure: PERCUTANEOUS STENT INTERVENTION;  Surgeon: Wellington Hampshire, MD;  Location: [facility] CATH LAB;  Service: Cardiovascular;  Laterality: Bilateral;  bilat iliac stents  . Lower extremity angiogram Bilateral 03/07/2014    Procedure: LOWER EXTREMITY ANGIOGRAM;  Surgeon: Wellington Hampshire, MD;  Location: [facility] CATH LAB;  Service: Cardiovascular;  Laterality: Bilateral;  . Parathyroidectomy N/A 09/26/2015    Procedure: PARATHYROIDECTOMY;  Surgeon: Armandina Gemma, MD;  Location: [facility] ORS;  Service: General;  Laterality: N/A;    Social History   Social History  . Marital Status: Married    Spouse Name: N/A  . Number of Children: N/A  . Years of Education: N/A   Occupational History  . Not on file.   Social History Main Topics  . Smoking status: Former Smoker -- 1.00 packs/day for 17 years    Types: Cigarettes    Quit date: 12/28/1982  . Smokeless tobacco: Never Used  . Alcohol Use: Yes     Comment: "NOT NOW"  . Drug Use: No  . Sexual Activity: No   Other Topics Concern  . Not on file   Social History Narrative    Family History  Problem Relation Age of Onset  .  Heart disease Mother   . Heart attack Father   . Heart disease Brother     ROS: Back pain but no fevers or chills, productive cough, hemoptysis, dysphasia, odynophagia, melena, hematochezia, dysuria, hematuria, rash, seizure activity, orthopnea, PND, pedal edema, claudication. Remaining systems are negative.  Physical Exam: Well-developed well-nourished in no acute distress.  Skin is warm and dry.  HEENT is normal.  Neck is supple.  Chest is clear to auscultation with normal expansion.  Cardiovascular exam is regular rate and rhythm.  Abdominal exam nontender or distended. No masses palpated. Extremities show no edema. neuro grossly intact  ECG-Sinus rhythm at a rate of 69. Septal infarct.  Assessment and plan  1 coronary artery disease-continue aspirin and statin.  2 hypertension-blood pressure controlled. Continue present medications.  3 hyperlipidemia-continue statin anxiety.  4 peripheral vascular disease-continue aspirin and statin. Followed by Dr. Fletcher Anon.  Kirk Ruths, MD

## 2016-07-07 ENCOUNTER — Encounter: Payer: Self-pay | Admitting: Cardiovascular Disease

## 2016-07-07 ENCOUNTER — Ambulatory Visit (INDEPENDENT_AMBULATORY_CARE_PROVIDER_SITE_OTHER): Payer: Medicare Other | Admitting: Cardiovascular Disease

## 2016-07-07 VITALS — BP 101/59 | HR 57 | Ht 68.0 in | Wt 143.8 lb

## 2016-07-07 DIAGNOSIS — I739 Peripheral vascular disease, unspecified: Secondary | ICD-10-CM | POA: Diagnosis not present

## 2016-07-07 NOTE — Progress Notes (Signed)
Cardiology Office Note   Date:  07/07/2016   ID:  Monesha, Frantz 1946-05-06, MRN [id number]  PCP:  Marton Redwood, MD  Cardiologist:   Dr. Stanford Breed  Chief Complaint  Patient presents with  . Follow-up    LIGHTHEADED;occasionally. CRAMPING; in legs. EDEMA; in hands.      History of Present Illness: [name] [name] is a 70 y.o. female who presents  for a follow up visit regarding  peripheral arterial disease. She has known history of coronary artery disease  s/p anterior MI in 1984 treated with thrombolytics and POBA. LHC 05/11/13: Proximal LAD 30%, mid LAD 30%, EF 55-60%, apical inferior HK. Overall, her LAD was heavily calcified but she had no flow limiting stenosis.  She is status post kissing common iliac stents done in 2015 with for severe claudication. She has been doing well since then with no recurrent claudication. Recent noninvasive vascular evaluation showed patent stent with normal ABI. She lost her husband in October 2016 and has been dealing with stress and anxiety since then. She lost more than 40 pounds due to that. She has poor appetite. No chest pain or shortness of breath.     Past Medical History  Diagnosis Date  . Ischemic heart disease   . Obesity   . Chronic anxiety   . Lumbar herniated disc   . Depression   . Situational stress   . Hyperlipemia   . Hypothyroidism   . Coronary artery disease CARDIOLOGIST- DR Vidal Schwalbe-- VISIT 04-01-2011 W/ CHART AND IN EPIC  . Right ureteral calculus   . 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  NO ISCHEMIA. SUSPECT THIS REPRESENTS PRIOR MI BUT PATTERN UNUSUAL GIVEN WORSE DEFECT AT REST. APICAL HYPODINESIS BUT OVERALL PRESERVED EF.  Marland Kitchen History of hypercalcemia   . History of echocardiogram 05-20-2007    NORMAL LVSF. DISTAL SEPTAL HYPOKINESIA. MILD LEVH W/ IMPAIRED LV RELAXATION. MILD AORTIC SCLEROSIS. MILD TRICUSPID  REGURGITATION  . Arthritis   . GERD (gastroesophageal reflux disease)   . PONV (postoperative nausea and vomiting)   . Panic attack as reaction to stress   . Female bladder prolapse   . Urge urinary incontinence   . Nephrolithiasis     "chronic" (03/07/2014)  . Family history of anesthesia complication     "sister had PONV" (03/07/2014)  . Pneumonia   . COPD (chronic obstructive pulmonary disease) ([place])   . 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 Type II diabetes mellitus ([place])   . Daily headache   . Migraine     "frequently; have had them since I was very young"  . PAD (peripheral artery disease) ([place])   . Hypertension   . Anginal pain (HCC)     LAST TOOK NTG 2 DAYS AGO  . History of gout     30 YRS AGO  . Fibromyalgia     Past Surgical History  Procedure Laterality Date  . Left ureteroscopic laser litho/ stone extraction  12-16-2009  &  11-17-2007  . Right ureteroscopic stone extraction   07-04-2008  . Carpal tunnel release Right 2005  . Extracorporeal shock wave lithotripsy  2007    X2  . Facial cosmetic surgery  ?2003; ?2007  . Ureteroscopy  01/25/2012    Procedure: URETEROSCOPY;  Surgeon: Claybon Jabs, MD;  Location: [facility];  Service: Urology;  Laterality: Right;  . Coronary angioplasty  1984  . Cardiac catheterization  04-18-2008; 05-08-2004;  11-29-1986; 08-19-1988    2009 REPORT---- MILD ANTERIOR HYPOKINESIS (FROM REMOTE MI), MILD CORONARY ATHEROSCLEROSIS W/ 30-40% NARROWING IN THE MID LAD & 50% NARROWING IN THE SMALL OBTUSE MARGINAL,  NORMAL RENAL ARTERIES W/ NO ABD. AORTIC DISEASE  . Iliac artery stent Bilateral 03/07/2014  . Tonsillectomy and adenoidectomy  1950's  . Cataract extraction w/ intraocular lens  implant, bilateral Bilateral 2013  . Dilation and curettage of uterus    . Abdominal angiogram N/A 03/07/2014    Procedure: ABDOMINAL ANGIOGRAM;  Surgeon: Wellington Hampshire, MD;  Location: [facility]  CATH LAB;  Service: Cardiovascular;  Laterality: N/A;  . Percutaneous stent intervention Bilateral 03/07/2014    Procedure: PERCUTANEOUS STENT INTERVENTION;  Surgeon: Wellington Hampshire, MD;  Location: [facility] CATH LAB;  Service: Cardiovascular;  Laterality: Bilateral;  bilat iliac stents  . Lower extremity angiogram Bilateral 03/07/2014    Procedure: LOWER EXTREMITY ANGIOGRAM;  Surgeon: Wellington Hampshire, MD;  Location: [facility] CATH LAB;  Service: Cardiovascular;  Laterality: Bilateral;  . Parathyroidectomy N/A 09/26/2015    Procedure: PARATHYROIDECTOMY;  Surgeon: Armandina Gemma, MD;  Location: [facility];  Service: General;  Laterality: N/A;     Current Outpatient Prescriptions  Medication Sig Dispense Refill  . acetaminophen (TYLENOL) 325 MG tablet Take 650 mg by mouth every 6 (six) hours as needed for mild pain. Reported on 01/03/2016    . ALPRAZolam (XANAX) 0.5 MG tablet Take 0.5-1 mg by mouth 2 (two) times daily. Take 1 tablet in the morning and 2 tablets at night    . aspirin EC 81 MG tablet Take 81 mg by mouth daily.    Marland Kitchen atorvastatin (LIPITOR) 40 MG tablet Take 1 tablet (40 mg total) by mouth daily. 90 tablet 3  . diltiazem (CARDIZEM CD) 120 MG 24 hr capsule Take 1 capsule (120 mg total) by mouth at bedtime. 90 capsule 2  . diphenhydrAMINE (SOMINEX) 25 MG tablet Take 25 mg by mouth at bedtime as needed for sleep.    Marland Kitchen ezetimibe (ZETIA) 10 MG tablet Take 1 tablet (10 mg total) by mouth daily. 90 tablet 2  . FLUoxetine (PROZAC) 20 MG capsule Take 20 mg by mouth daily with breakfast.     . fluticasone (FLONASE) 50 MCG/ACT nasal spray Place 1 spray into both nostrils daily.    Marland Kitchen ibuprofen (ADVIL,MOTRIN) 200 MG tablet Take 200 mg by mouth every 6 (six) hours as needed (joint pain and inflammation).     . isometheptene-acetaminophen-dichloralphenazone (MIDRIN) 65-325-100 MG capsule Take 1 capsule by mouth 4 (four) times daily as needed for migraine.     Marland Kitchen losartan (COZAAR) 25 MG tablet Take 1 tablet (25 mg total)  by mouth daily. 90 tablet 3  . metFORMIN (GLUCOPHAGE) 1000 MG tablet Take 1 tablet (1,000 mg total) by mouth 2 (two) times daily.    . metoprolol succinate (TOPROL-XL) 25 MG 24 hr tablet Take 1 tablet (25 mg total) by mouth daily. (Patient taking differently: Take 12.5 mg by mouth daily. ) 90 tablet 3  . nitroGLYCERIN (NITROSTAT) 0.4 MG SL tablet Place 1 tablet (0.4 mg total) under the tongue every 5 (five) minutes as needed for chest pain. 25 tablet 6  . omeprazole (PRILOSEC) 20 MG capsule Take 20 mg by mouth daily as needed (heart burn).     . Oxycodone HCl 10 MG TABS TAKE 1/2-1 TABLET BY ORAL ROUTE EVERY 8-12 HOURS, 30 DAY SUPPLY, DNF  09/20/15  0  . oxyCODONE-acetaminophen (PERCOCET/ROXICET) 5-325 MG tablet Take 1-2 tablets by mouth once. 20 tablet 0  . simethicone (MYLICON) 0000000 MG chewable tablet Chew 125 mg by mouth every 6 (six) hours as needed for flatulence.    Marland Kitchen SYNTHROID 112 MCG tablet Take 1 tablet by mouth daily. Take 1 tab daily    . traMADol (ULTRAM) 50 MG tablet Take 50 mg by mouth every 8 (eight) hours as needed. for pain  1  . VENTOLIN HFA 108 (90 Base) MCG/ACT inhaler Inhale 1 puff into the lungs 2 (two) times a week. Use as needed    . Vitamin D, Ergocalciferol, (DRISDOL) 50000 UNITS CAPS capsule Take 50,000 Units by mouth once a week.  2   No current facility-administered medications for this visit.    Allergies:   Cephalosporins    Social History:  The patient  reports that she quit smoking about 33 years ago. Her smoking use included Cigarettes. She has a 17 pack-year smoking history. She has never used smokeless tobacco. She reports that she drinks alcohol. She reports that she does not use illicit drugs.   Family History:  The patient's family history includes Heart attack in her father; Heart disease in her brother and mother.    ROS:  Please see the history of present illness.   Otherwise, review of systems are positive for none.   All other systems are reviewed and  negative.    PHYSICAL EXAM: VS:  BP 101/59 mmHg  Pulse 57  Ht 5\' 8"  (1.727 m)  Wt 143 lb 12.8 oz (65.227 kg)  BMI 21.87 kg/m2 , BMI Body mass index is 21.87 kg/(m^2). GEN: Well nourished, well developed, in no acute distress HEENT: normal Neck: no JVD, carotid bruits, or masses Cardiac: Regular rate and rhythm ; no murmurs, rubs, or gallops,no edema  Respiratory:  clear to auscultation bilaterally, normal work of breathing GI: soft, nontender, nondistended, + BS MS: no deformity or atrophy Skin: warm and dry, no rash Neuro:  Strength and sensation are intact Psych: euthymic mood, full affect  vascular: Distal pulses are normal.   EKG:  EKG is not ordered today.  Recent Labs: 09/25/2015: BUN 21*; Creatinine, Ser 1.12*; Hemoglobin 12.5; Platelets 305; Potassium 4.3; Sodium 137 02/03/2016: ALT 10    Lipid Panel    Component Value Date/Time   CHOL 109* 02/03/2016 0956   TRIG 63 02/03/2016 0956   HDL 48 02/03/2016 0956   CHOLHDL 2.3 02/03/2016 0956   VLDL 13 02/03/2016 0956   LDLCALC 48 02/03/2016 0956      Wt Readings from Last 3 Encounters:  07/07/16 143 lb 12.8 oz (65.227 kg)  05/14/16 148 lb (67.132 kg)  01/15/16 152 lb (68.947 kg)        ASSESSMENT AND PLAN:  1.  Peripheral arterial disease: Status post bilateral kissing stent placement to the common iliac arteries in 2015. Noninvasive vascular evaluation in June of this year showed patent stents with normal ABI. She has palpable distal pulses. Repeat vascular studies in one year. She has no claudication.  2. Hyperlipidemia: Currently on atorvastatin and Zetia with most recent LDL of 48.  3. Essential hypertension: Blood pressure is controlled on current medications.      Disposition:   FU with me in 1 year  Signed, Kathlyn Sacramento, MD  07/07/2016 10:59 AM    [place]

## 2016-07-07 NOTE — Patient Instructions (Signed)
Medication Instructions:  Continue current medications  Labwork: NONE  Testing/Procedures: Your physician has requested that you have an ankle brachial index (ABI) in 1 Year. During this test an ultrasound and blood pressure cuff are used to evaluate the arteries that supply the arms and legs with blood. Allow thirty minutes for this exam. There are no restrictions or special instructions.  Your physician has requested that you have an Aorta Iliac Duplex in 1 Year.  Follow-Up: Your physician wants you to follow-up in: 1 Year. You will receive a reminder letter in the mail two months in advance. If you don't receive a letter, please call our office to schedule the follow-up appointment.   Any Other Special Instructions Will Be Listed Below (If Applicable).   If you need a refill on your cardiac medications before your next appointment, please call your pharmacy.

## 2016-07-08 ENCOUNTER — Encounter: Payer: Self-pay | Admitting: Cardiology

## 2016-07-08 ENCOUNTER — Ambulatory Visit (INDEPENDENT_AMBULATORY_CARE_PROVIDER_SITE_OTHER): Payer: Medicare Other | Admitting: Cardiology

## 2016-07-08 VITALS — BP 117/73 | HR 69 | Ht 68.0 in | Wt 141.0 lb

## 2016-07-08 DIAGNOSIS — I2510 Atherosclerotic heart disease of native coronary artery without angina pectoris: Secondary | ICD-10-CM | POA: Diagnosis not present

## 2016-07-08 DIAGNOSIS — I10 Essential (primary) hypertension: Secondary | ICD-10-CM

## 2016-07-08 DIAGNOSIS — E785 Hyperlipidemia, unspecified: Secondary | ICD-10-CM

## 2016-07-08 NOTE — Patient Instructions (Signed)
Your physician wants you to follow-up in: 6 months with Dr. Crenshaw. You will receive a reminder letter in the mail two months in advance. If you don't receive a letter, please call our office to schedule the follow-up appointment.  If you need a refill on your cardiac medications before your next appointment, please call your pharmacy.   

## 2016-07-23 ENCOUNTER — Other Ambulatory Visit (HOSPITAL_COMMUNITY): Payer: Self-pay | Admitting: *Deleted

## 2016-07-24 ENCOUNTER — Ambulatory Visit (HOSPITAL_COMMUNITY)
Admission: RE | Admit: 2016-07-24 | Discharge: 2016-07-24 | Disposition: A | Discharge status: Home / Self Care | Payer: Medicare Other | Source: Ambulatory Visit | Attending: Internal Medicine | Admitting: Internal Medicine

## 2016-07-24 DIAGNOSIS — M810 Age-related osteoporosis without current pathological fracture: Secondary | ICD-10-CM | POA: Diagnosis not present

## 2016-07-24 MED ORDER — ZOLEDRONIC ACID 5 MG/100ML IV SOLN
INTRAVENOUS | Status: AC
  Administered 2016-07-24: 5 mg via INTRAVENOUS
  Filled 2016-07-24: qty 100

## 2016-07-24 MED ORDER — ZOLEDRONIC ACID 5 MG/100ML IV SOLN
5.0000 mg | Freq: Once | INTRAVENOUS | Status: AC
  Administered 2016-07-24: 5 mg via INTRAVENOUS

## 2016-08-26 ENCOUNTER — Telehealth: Payer: Self-pay | Admitting: *Deleted

## 2016-08-26 NOTE — Telephone Encounter (Signed)
pt feels like she should have her metoprolol cut in half but she said she cut it in half herself before calling us and it was still to much, please call.

## 2016-08-27 ENCOUNTER — Other Ambulatory Visit: Payer: Self-pay | Admitting: Internal Medicine

## 2016-08-27 DIAGNOSIS — R918 Other nonspecific abnormal finding of lung field: Secondary | ICD-10-CM

## 2016-08-27 NOTE — Telephone Encounter (Signed)
Returned call to patient. Apologized that she was not called back yesterday.  Patient states her metoprolol succinate makes her tired When she takes her AM medicine, she is more "give out" She states she has been taking only 1/2 of toprol 61m QD  It was reported at her last MD visit w/Dr. Fletcher Anon on 7/11, that "patient taking differently" - taking 12.5mg  daily  -- this is on her med list  She called in with concerns about this as her pain MD "scolded her" for not taking her medication as prescribed.   Informed patient I will make certain MD is aware of the med dose she is taking, but reassured her this was noted on her med list in epic.   She inquired about needing an updated Rx:  Patient states she was told that this Rx comes in 12.5mg  tablet - explained that smallest tab strength is 25mg  and she would have to cut in half.   Routed to MD as Juluis Rainier

## 2016-08-28 ENCOUNTER — Ambulatory Visit
Admission: RE | Admit: 2016-08-28 | Discharge: 2016-08-28 | Disposition: A | Discharge status: Home / Self Care | Payer: Medicare Other | Source: Ambulatory Visit | Attending: Internal Medicine | Admitting: Internal Medicine

## 2016-08-28 DIAGNOSIS — R918 Other nonspecific abnormal finding of lung field: Secondary | ICD-10-CM

## 2016-10-12 ENCOUNTER — Institutional Professional Consult (permissible substitution): Payer: Medicare Other | Admitting: Internal Medicine

## 2016-10-24 ENCOUNTER — Other Ambulatory Visit: Payer: Self-pay | Admitting: Cardiology

## 2016-10-26 ENCOUNTER — Telehealth: Payer: Self-pay

## 2016-10-26 NOTE — Telephone Encounter (Signed)
Spoke with patient and she stated that she is taking metoprolol succinate (TOPROL-XL) 25 MG 24 hr tablet - 12.5 mg by mouth daily. Refill sent to pharmacy as the way the patient is taking.

## 2016-10-27 ENCOUNTER — Other Ambulatory Visit: Payer: Self-pay | Admitting: Cardiology

## 2017-01-24 ENCOUNTER — Other Ambulatory Visit: Payer: Self-pay | Admitting: Cardiology

## 2017-01-29 ENCOUNTER — Other Ambulatory Visit: Payer: Self-pay | Admitting: Cardiology

## 2017-01-29 DIAGNOSIS — E785 Hyperlipidemia, unspecified: Secondary | ICD-10-CM

## 2017-02-22 ENCOUNTER — Institutional Professional Consult (permissible substitution): Payer: Medicare Other | Admitting: Internal Medicine

## 2017-03-01 ENCOUNTER — Encounter: Payer: Self-pay | Admitting: Internal Medicine

## 2017-03-19 ENCOUNTER — Encounter: Payer: Self-pay | Admitting: Pulmonary Disease

## 2017-03-23 ENCOUNTER — Ambulatory Visit: Payer: Medicare Other | Admitting: Cardiology

## 2017-03-23 NOTE — Progress Notes (Signed)
HPI: FU CAD, s/p anterior MI in 1984 treated with thrombolytics and POBA. Also with PVD. 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 She underwent kissing stent placement to the bilateral CIA extending into the distal aorta. Procedure was c/b by small non-flow limiting dissection involving L EIA and tiny dissection of distal aorta that was not flow limiting. These were treated medically. Since last seen, there is no dyspnea on exertion, orthopnea, PND, pedal edema, exertional chest pain or syncope.  Studies:  - Echo in 5/08: Normal LV function, distal septal HK, impaired LV relaxation, mild TR.  - Myoview 1/17: Ejection fraction 58%, prior anterior and apical infarct but no ischemia. - LHC 05/11/13: Proximal LAD 30%, mid LAD 30%, EF 55-60%, apical inferior HK. Overall, her LAD was heavily calcified but she had no flow limiting stenosis. Abnormal nuclear study was felt to be related to prior anterior MI. Continued medical therapy recommended.  - Abdominal US (6/17): No AAA - ABIs 6/17 normal  Current Outpatient Prescriptions  Medication Sig Dispense Refill  . acetaminophen (TYLENOL) 325 MG tablet Take 650 mg by mouth every 6 (six) hours as needed for mild pain. Reported on 01/03/2016    . ALPRAZolam (XANAX) 0.5 MG tablet Take 0.5-1 mg by mouth 2 (two) times daily. Take 1 tablet in the morning and 2 tablets at night    . aspirin EC 81 MG tablet Take 81 mg by mouth daily.    Marland Kitchen atorvastatin (LIPITOR) 40 MG tablet TAKE 1 TABLET (40 MG TOTAL) BY MOUTH DAILY. 90 tablet 3  . diltiazem (CARDIZEM CD) 120 MG 24 hr capsule TAKE ONE CAPSULE BY MOUTH AT BEDTIME 90 capsule 2  . ezetimibe (ZETIA) 10 MG tablet Take 1 tablet (10 mg total) by mouth daily. 90 tablet 2  . FLUoxetine (PROZAC) 20 MG capsule Take 20 mg by mouth daily with breakfast.     . fluticasone (FLONASE) 50 MCG/ACT nasal spray  Place 1 spray into both nostrils daily.    Marland Kitchen ibuprofen (ADVIL,MOTRIN) 200 MG tablet Take 200 mg by mouth every 6 (six) hours as needed (joint pain and inflammation).     Marland Kitchen losartan (COZAAR) 25 MG tablet Take 1 tablet (25 mg total) by mouth daily. 90 tablet 2  . metFORMIN (GLUCOPHAGE) 1000 MG tablet Take 1 tablet (1,000 mg total) by mouth 2 (two) times daily.    . metoprolol succinate (TOPROL-XL) 25 MG 24 hr tablet Take 0.5 tablets (12.5 mg total) by mouth daily. 45 tablet 2  . nitroGLYCERIN (NITROSTAT) 0.4 MG SL tablet Place 1 tablet (0.4 mg total) under the tongue every 5 (five) minutes as needed for chest pain. 25 tablet 6  . omeprazole (PRILOSEC) 20 MG capsule Take 20 mg by mouth daily as needed (heart burn).     . Oxycodone HCl 10 MG TABS TAKE 1/2-1 TABLET BY ORAL ROUTE EVERY 8-12 HOURS, 30 DAY SUPPLY, DNF 09/20/15  0  . simethicone (MYLICON) 627 MG chewable tablet Chew 125 mg by mouth every 6 (six) hours as needed for flatulence.    Marland Kitchen SYNTHROID 112 MCG tablet Take 1 tablet by mouth daily. Take 1 tab daily    . VENTOLIN HFA 108 (90 Base) MCG/ACT inhaler Inhale 1 puff into the lungs 2 (two) times a week. Use as needed    . Vitamin D, Ergocalciferol, (DRISDOL) 50000 UNITS CAPS capsule Take 50,000 Units  by mouth once a week.  2  . isometheptene-acetaminophen-dichloralphenazone (MIDRIN) 65-325-100 MG capsule Take 1 capsule by mouth 4 (four) times daily as needed for migraine.      No current facility-administered medications for this visit.      Past Medical History:  Diagnosis Date  . Abnormal finding on cardiovascular stress test 04-09-2011   MID TO APICAL ANTERIOR PERFUSION DEFECT MORE PRONOUNCED AT REST THAN WITH STRESS.  NO ISCHEMIA. SUSPECT THIS REPRESENTS PRIOR MI BUT PATTERN UNUSUAL GIVEN WORSE DEFECT AT REST. APICAL HYPODINESIS BUT OVERALL PRESERVED EF.  Marland Kitchen Anginal pain (HCC)    LAST TOOK NTG 2 DAYS AGO  . 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 Arthritis   . Chronic anxiety   . COPD (chronic obstructive pulmonary disease) ([place])   . Coronary artery disease CARDIOLOGIST- DR Vidal Schwalbe-- VISIT 04-01-2011 W/ CHART AND IN EPIC  . Daily headache   . Depression   . Family history of anesthesia complication    "sister had PONV" (03/07/2014)  . Female bladder prolapse   . Fibromyalgia   . GERD (gastroesophageal reflux disease)   . History of echocardiogram 05-20-2007   NORMAL LVSF. DISTAL SEPTAL HYPOKINESIA. MILD LEVH W/ IMPAIRED LV RELAXATION. MILD AORTIC SCLEROSIS. MILD TRICUSPID REGURGITATION  . History of gout    30 YRS AGO  . History of hypercalcemia   . Hyperlipemia   . Hypertension   . Hypothyroidism   . Ischemic heart disease   . Lumbar herniated disc   . Migraine    "frequently; have had them since I was very young"  . Nephrolithiasis    "chronic" (03/07/2014)  . Obesity   . PAD (peripheral artery disease) ([place])   . Panic attack as reaction to stress   . Pneumonia   . PONV (postoperative nausea and vomiting)   . Right ureteral calculus   . Situational stress   . Type II diabetes mellitus ([place])   . Urge urinary incontinence     Past Surgical History:  Procedure Laterality Date  . ABDOMINAL ANGIOGRAM N/A 03/07/2014   Procedure: ABDOMINAL ANGIOGRAM;  Surgeon: Wellington Hampshire, MD;  Location: [facility] C[facility] LAB;  Service: Cardiovascular;  Laterality: N/A;  . CARDIAC CATHETERIZATION  04-18-2008; 05-08-2004;  11-29-1986; 08-19-1988   2009 REPORT---- MILD ANTERIOR HYPOKINESIS (FROM REMOTE MI), MILD CORONARY ATHEROSCLEROSIS W/ 30-40% NARROWING IN THE MID LAD & 50% NARROWING IN THE SMALL OBTUSE MARGINAL,  NORMAL RENAL ARTERIES W/ NO ABD. AORTIC DISEASE  . CARPAL TUNNEL RELEASE Right 2005  . CATARACT EXTRACTION W/ INTRAOCULAR LENS  IMPLANT, BILATERAL Bilateral 2013  . CORONARY ANGIOPLASTY  1984  . DILATION AND CURETTAGE OF UTERUS    . EXTRACORPOREAL SHOCK WAVE LITHOTRIPSY  2007   X2  . FACIAL COSMETIC SURGERY   ?2003; ?2007  . ILIAC ARTERY STENT Bilateral 03/07/2014  . LEFT URETEROSCOPIC LASER LITHO/ STONE EXTRACTION  12-16-2009  &  11-17-2007  . LOWER EXTREMITY ANGIOGRAM Bilateral 03/07/2014   Procedure: LOWER EXTREMITY ANGIOGRAM;  Surgeon: Wellington Hampshire, MD;  Location: [facility] CATH LAB;  Service: Cardiovascular;  Laterality: Bilateral;  . PARATHYROIDECTOMY N/A 09/26/2015   Procedure: PARATHYROIDECTOMY;  Surgeon: Armandina Gemma, MD;  Location: [facility] ORS;  Service: General;  Laterality: N/A;  . PERCUTANEOUS STENT INTERVENTION Bilateral 03/07/2014   Procedure: PERCUTANEOUS STENT INTERVENTION;  Surgeon: Wellington Hampshire, MD;  Location: [facility] CATH LAB;  Service: Cardiovascular;  Laterality: Bilateral;  bilat iliac stents  . RIGHT URETEROSCOPIC STONE EXTRACTION  07-04-2008  . TONSILLECTOMY AND ADENOIDECTOMY  1950's  . URETEROSCOPY  01/25/2012   Procedure: URETEROSCOPY;  Surgeon: Claybon Jabs, MD;  Location[facility];  Service: Urology;  Laterality: Right;    Social History   Social History  . Marital status: Married    Spouse name: N/A  . Number of children: N/A  . Years of education: N/A   Occupational History  . Not on file.   Social History Main Topics  . Smoking status: Former Smoker    Packs/day: 1.00    Years: 17.00    Types: Cigarettes    Quit date: 12/28/1982  . Smokeless tobacco: Never Used  . Alcohol use Yes     Comment: "NOT NOW"  . Drug use: No  . Sexual activity: No   Other Topics Concern  . Not on file   Social History Narrative  . No narrative on file    Family History  Problem Relation Age of Onset  . Heart disease Mother   . Heart attack Father   . Heart disease Brother     ROS: no fevers or chills, productive cough, hemoptysis, dysphasia, odynophagia, melena, hematochezia, dysuria, hematuria, rash, seizure activity, orthopnea, PND, pedal edema, claudication. Remaining systems are negative.  Physical Exam: Well-developed well-nourished in no acute  distress.  Skin is warm and dry.  HEENT is normal.  Neck is supple. No bruits Chest is clear to auscultation with normal expansion.  Cardiovascular exam is regular rate and rhythm.  Abdominal exam nontender or distended. No masses palpated. Extremities show no edema. neuro grossly intact  ECG- sinus bradycardia at a rate of 47. No ST changes. personally reviewed  A/P  1 coronary artery disease-continue aspirin and statin.  2 peripheral vascular disease-continue aspirin and statin. Followed by Dr. Fletcher Anon.  3 hypertension-blood pressure low and pt bradycardic; DC cardizem and follow.  4 hyperlipidemia-continue statin.  5 anxiety-management per primary care.  Kirk Ruths, MD

## 2017-03-24 ENCOUNTER — Encounter: Payer: Self-pay | Admitting: Cardiology

## 2017-04-05 ENCOUNTER — Encounter: Payer: Self-pay | Admitting: Cardiology

## 2017-04-05 ENCOUNTER — Ambulatory Visit (INDEPENDENT_AMBULATORY_CARE_PROVIDER_SITE_OTHER): Payer: Medicare Other | Admitting: Cardiology

## 2017-04-05 VITALS — BP 92/56 | HR 47 | Ht 67.0 in | Wt 151.0 lb

## 2017-04-05 DIAGNOSIS — I2510 Atherosclerotic heart disease of native coronary artery without angina pectoris: Secondary | ICD-10-CM | POA: Diagnosis not present

## 2017-04-05 DIAGNOSIS — I10 Essential (primary) hypertension: Secondary | ICD-10-CM | POA: Diagnosis not present

## 2017-04-05 DIAGNOSIS — E7800 Pure hypercholesterolemia, unspecified: Secondary | ICD-10-CM | POA: Diagnosis not present

## 2017-04-05 NOTE — Patient Instructions (Signed)
Medication Instructions:   STOP DILTIAZEM  Follow-Up:  Your physician recommends that you schedule a follow-up appointment in: AS NEEDED   TRACK BLOOD PRESSURE AND CALL IF CONSISTENTLY ABOVE 130/85

## 2017-04-29 ENCOUNTER — Ambulatory Visit (AMBULATORY_SURGERY_CENTER): Payer: Self-pay

## 2017-04-29 ENCOUNTER — Encounter: Payer: Self-pay | Admitting: Internal Medicine

## 2017-04-29 VITALS — Ht 68.0 in | Wt 144.4 lb

## 2017-04-29 DIAGNOSIS — Z1211 Encounter for screening for malignant neoplasm of colon: Secondary | ICD-10-CM

## 2017-04-29 MED ORDER — NA SULFATE-K SULFATE-MG SULF 17.5-3.13-1.6 GM/177ML PO SOLN: 1.0000 | Freq: Once | ORAL | 0 refills | Status: AC

## 2017-04-29 NOTE — Progress Notes (Signed)
Denies allergies to eggs or soy products. Denies complication of anesthesia or sedation. Denies use of weight loss medication. Denies use of O2.   Emmi instructions declined. Patient does not have a computer.

## 2017-05-04 ENCOUNTER — Encounter: Payer: Self-pay | Admitting: Pulmonary Disease

## 2017-05-04 ENCOUNTER — Ambulatory Visit (INDEPENDENT_AMBULATORY_CARE_PROVIDER_SITE_OTHER): Payer: Medicare Other | Admitting: Pulmonary Disease

## 2017-05-04 DIAGNOSIS — G4733 Obstructive sleep apnea (adult) (pediatric): Secondary | ICD-10-CM | POA: Insufficient documentation

## 2017-05-04 NOTE — Patient Instructions (Addendum)
You have moderate obstructive sleep apnea- your breathing slowed down 20 times an hour during the study Would recommend trial of CPAP machine with nasal pillows-let me know if you're willing to proceed

## 2017-05-04 NOTE — Assessment & Plan Note (Signed)
The pathophysiology of obstructive sleep apnea , it's cardiovascular consequences & modes of treatment including CPAP were discused with the patient in detail & they evidenced understanding. Surprisingly she does have mild -moderate OSA, predominantly hypopneas and RERas. We discussed the relationship between narcotics and sleep disordered breathing. She expressed that she would not want to be in pain . I explained that sleep study was performed on narcotics and it would be reasonable to continue these medications as long as she understood risks and benefits.   We discussed that oxygen alone would not be curative for this condition. She will discuss with her daughter and get back to [place] if she is willing to pursue CPAP therapy. If so, we will trial auto CPAP with nasal pillows and see if she tolerates this. I doubt she would be a good candidate for oral appliance

## 2017-05-04 NOTE — Progress Notes (Signed)
Subjective:    Patient ID: [name] [name], female    DOB: [birth year], 71 y.o.   MRN: [id number]  HPI  Chief Complaint  Patient presents with  . Sleep Consult    Referred by Dr. Greta Doom for possible sleep apnea    71 year old woman referred for evaluation of obstructive sleep apnea. She has chronic back pain and is maintained on oxycodone for many years. She had a positive stop bang screening test and therefore underwent a sleep study. NPSG 04/2016 showed moderate OSA with predominant hypopneas and RERAs with AHI 21/hour with lowest desaturation of 85% . She weighed 148 pounds then . This was soon after the demise of her husband.  She is very anxious today for her visit because she is worried that her pain medications would be taken away as a result of this positive sleep test. Her sister had a difficult time with CPAP therapy and she has severe anxiety about starting this too. She denies any symptoms related to her sleep. She is retired and feels that she can sleep at any time. Epworth sleepiness score is reported only as t2. She denies sleepiness in social situations. She always falls asleep watching TV, she is condition herself to this with a TV in her bedroom. Bedtime is between 10 and 11 PM, sleep latency is minimal, TV stays on and is on a timer, reports one to 2 nocturnal awakenings, denies nocturia is out of bed between 6 and 8 AM feeling refreshed without dryness of mouth or headaches.  She lives by herself in no bed partner history is available. She has a history of hypertension, diabetes and COPD. She only uses Ventolin on an as-needed basis. She is on thyroid replacement. She has severe depression and anxiety. She is planning to move to [place] to be close to her daughter in the fall. She smoked less than 10 pack years and quit 20 years ago  She lost about 80 pounds during her husband's illness, was 148 pounds around the time of her sleep study and is 45 pounds now    Past  Medical History:  Diagnosis Date  . Abnormal finding on cardiovascular stress test 04-09-2011   MID TO APICAL ANTERIOR PERFUSION DEFECT MORE PRONOUNCED AT REST THAN WITH STRESS.  NO ISCHEMIA. SUSPECT THIS REPRESENTS PRIOR MI BUT PATTERN UNUSUAL GIVEN WORSE DEFECT AT REST. APICAL HYPODINESIS BUT OVERALL PRESERVED EF.  Marland Kitchen Allergy   . Anemia   . Anginal pain (HCC)    LAST TOOK NTG 2 DAYS AGO  . Anterior wall myocardial infarction ([place]) 02/1983   S/P ANGIOPLASTY AND THROMBOLYTICS  . Arthritis   . Cataract   . Chronic anxiety   . COPD (chronic obstructive pulmonary disease) ([place])    Patient denies  . Coronary artery disease CARDIOLOGIST- DR Vidal Schwalbe-- VISIT 04-01-2011 W/ CHART AND IN EPIC  . Daily headache   . Depression   . Family history of anesthesia complication    "sister had PONV" (03/07/2014)  . Female bladder prolapse   . Fibromyalgia   . GERD (gastroesophageal reflux disease)   . History of echocardiogram 05-20-2007   NORMAL LVSF. DISTAL SEPTAL HYPOKINESIA. MILD LEVH W/ IMPAIRED LV RELAXATION. MILD AORTIC SCLEROSIS. MILD TRICUSPID REGURGITATION  . History of gout    30 YRS AGO  . History of hypercalcemia   . Hyperlipemia   . Hypertension   . Hypothyroidism   . Ischemic heart disease   . Lumbar herniated disc   . Migraine    "  frequently; have had them since I was very young"  . Nephrolithiasis    "chronic" (03/07/2014)  . Obesity   . Osteoporosis   . PAD (peripheral artery disease) ([place])   . Panic attack as reaction to stress   . Pneumonia   . PONV (postoperative nausea and vomiting)   . Right ureteral calculus   . Situational stress   . Type II diabetes mellitus ([place])   . Urge urinary incontinence    Past Surgical History:  Procedure Laterality Date  . ABDOMINAL ANGIOGRAM N/A 03/07/2014   Procedure: ABDOMINAL ANGIOGRAM;  Surgeon: Wellington Hampshire, MD;  Location: [facility] CATH LAB;  Service: Cardiovascular;  Laterality: N/A;  . CARDIAC CATHETERIZATION  04-18-2008;  05-08-2004;  11-29-1986; 08-19-1988   2009 REPORT---- MILD ANTERIOR HYPOKINESIS (FROM REMOTE MI), MILD CORONARY ATHEROSCLEROSIS W/ 30-40% NARROWING IN THE MID LAD & 50% NARROWING IN THE SMALL OBTUSE MARGINAL,  NORMAL RENAL ARTERIES W/ NO ABD. AORTIC DISEASE  . CARPAL TUNNEL RELEASE Right 2005  . CATARACT EXTRACTION W/ INTRAOCULAR LENS  IMPLANT, BILATERAL Bilateral 2013  . CORONARY ANGIOPLASTY  1984  . DILATION AND CURETTAGE OF UTERUS    . EXTRACORPOREAL SHOCK WAVE LITHOTRIPSY  2007   X2  . FACIAL COSMETIC SURGERY  ?2003; ?2007  . ILIAC ARTERY STENT Bilateral 03/07/2014  . LEFT URETEROSCOPIC LASER LITHO/ STONE EXTRACTION  12-16-2009  &  11-17-2007  . LOWER EXTREMITY ANGIOGRAM Bilateral 03/07/2014   Procedure: LOWER EXTREMITY ANGIOGRAM;  Surgeon: Wellington Hampshire, MD;  Location: [facility] CATH LAB;  Service: Cardiovascular;  Laterality: Bilateral;  . PARATHYROIDECTOMY N/A 09/26/2015   Procedure: PARATHYROIDECTOMY;  Surgeon: Armandina Gemma, MD;  Location: [facility] ORS;  Service: General;  Laterality: N/A;  . PERCUTANEOUS STENT INTERVENTION Bilateral 03/07/2014   Procedure: PERCUTANEOUS STENT INTERVENTION;  Surgeon: Wellington Hampshire, MD;  Location: [facility] CATH LAB;  Service: Cardiovascular;  Laterality: Bilateral;  bilat iliac stents  . RIGHT URETEROSCOPIC STONE EXTRACTION   07-04-2008  . TONSILLECTOMY AND ADENOIDECTOMY  1950's  . URETEROSCOPY  01/25/2012   Procedure: URETEROSCOPY;  Surgeon: Claybon Jabs, MD;  Location: [facility];  Service: Urology;  Laterality: Right;    Allergies  Allergen Reactions  . Cephalosporins Hives     Social History   Social History  . Marital status: Married    Spouse name: N/A  . Number of children: N/A  . Years of education: N/A   Occupational History  . Not on file.   Social History Main Topics  . Smoking status: Former Smoker    Packs/day: 1.00    Years: 17.00    Types: Cigarettes    Quit date: 12/28/1982  . Smokeless tobacco: Never Used  . Alcohol  use Yes     Comment: "NOT NOW"  . Drug use: No  . Sexual activity: No   Other Topics Concern  . Not on file   Social History Narrative  . No narrative on file     Family History  Problem Relation Age of Onset  . Heart disease Mother   . Heart attack Father   . Esophageal cancer Sister   . Heart disease Brother   . Colon cancer Neg Hx   . Rectal cancer Neg Hx   . Stomach cancer Neg Hx      Review of Systems  Positive for weight loss, intermittent chest pain, light headaches, seldom nasal congestion and sneezing, earache and anxiety and depression  Constitutional: negative for anorexia, fevers and sweats  Eyes: negative  for irritation, redness and visual disturbance  Ears, nose, mouth, throat, and face: negative for epistaxis and sore throat  Respiratory: negative for cough, dyspnea on exertion, sputum and wheezing  Cardiovascular: negative for chest pain, dyspnea, lower extremity edema, orthopnea, palpitations and syncope  Gastrointestinal: negative for abdominal pain, constipation, diarrhea, melena, nausea and vomiting  Genitourinary:negative for dysuria, frequency and hematuria  Hematologic/lymphatic: negative for bleeding, easy bruising and lymphadenopathy  Musculoskeletal:negative for arthralgias, muscle weakness and stiff joints  Neurological: negative for coordination problems, gait problems and weakness  Endocrine: negative for diabetic symptoms including polydipsia, polyuria and weight loss     Objective:   Physical Exam   Gen. Pleasant, well-nourished,thin, in no distress, anxious affect ENT - class 1 airway, no post nasal drip Neck: No JVD, no thyromegaly, no carotid bruits Lungs: no use of accessory muscles, no dullness to percussion, clear without rales or rhonchi  Cardiovascular: Rhythm regular, heart sounds  normal, no murmurs or gallops, no peripheral edema Abdomen: soft and non-tender, no hepatosplenomegaly, BS normal. Musculoskeletal: No deformities,  no cyanosis or clubbing Neuro:  alert, non focal        Assessment & Plan:

## 2017-05-06 ENCOUNTER — Institutional Professional Consult (permissible substitution): Payer: Medicare Other | Admitting: Internal Medicine

## 2017-05-13 ENCOUNTER — Ambulatory Visit (AMBULATORY_SURGERY_CENTER): Payer: Medicare Other | Admitting: Internal Medicine

## 2017-05-13 ENCOUNTER — Encounter: Payer: Self-pay | Admitting: Internal Medicine

## 2017-05-13 VITALS — BP 122/46 | HR 56 | Temp 98.0°F | Resp 10 | Ht 68.0 in | Wt 144.0 lb

## 2017-05-13 DIAGNOSIS — D120 Benign neoplasm of cecum: Secondary | ICD-10-CM

## 2017-05-13 DIAGNOSIS — Z1212 Encounter for screening for malignant neoplasm of rectum: Secondary | ICD-10-CM

## 2017-05-13 DIAGNOSIS — Z1211 Encounter for screening for malignant neoplasm of colon: Secondary | ICD-10-CM | POA: Diagnosis present

## 2017-05-13 MED ORDER — SODIUM CHLORIDE 0.9 % IV SOLN: 500.0000 mL | INTRAVENOUS | Status: AC

## 2017-05-13 MED ORDER — DEXTROSE 5 % IV SOLN: INTRAVENOUS | Status: AC

## 2017-05-13 NOTE — Progress Notes (Signed)
Pt's states no medical or surgical changes since previsit or office visit. Patient vague about history, and medications.

## 2017-05-13 NOTE — Patient Instructions (Signed)
Handouts given on polyps and divertiulosis   YOU HAD AN ENDOSCOPIC PROCEDURE TODAY: Refer to the procedure report and other information in the discharge instructions given to you for any specific questions about what was found during the examination. If this information does not answer your questions, please call [place] office at [phone] to clarify.   YOU SHOULD EXPECT: Some feelings of bloating in the abdomen. Passage of more gas than usual. Walking can help get rid of the air that was put into your GI tract during the procedure and reduce the bloating. If you had a lower endoscopy (such as a colonoscopy or flexible sigmoidoscopy) you may notice spotting of blood in your stool or on the toilet paper. Some abdominal soreness may be present for a day or two, also.  DIET: Your first meal following the procedure should be a light meal and then it is ok to progress to your normal diet. A half-sandwich or bowl of soup is an example of a good first meal. Heavy or fried foods are harder to digest and may make you feel nauseous or bloated. Drink plenty of fluids but you should avoid alcoholic beverages for 24 hours. If you had a esophageal dilation, please see attached instructions for diet.    ACTIVITY: Your care partner should take you home directly after the procedure. You should plan to take it easy, moving slowly for the rest of the day. You can resume normal activity the day after the procedure however YOU SHOULD NOT DRIVE, use power tools, machinery or perform tasks that involve climbing or major physical exertion for 24 hours (because of the sedation medicines used during the test).   SYMPTOMS TO REPORT IMMEDIATELY: A gastroenterologist can be reached at any hour. Please call [phone]  for any of the following symptoms:  Following lower endoscopy (colonoscopy, flexible sigmoidoscopy) Excessive amounts of blood in the stool  Significant tenderness, worsening of abdominal pains  Swelling of  the abdomen that is new, acute  Fever of 100 or higher    FOLLOW UP:  If any biopsies were taken you will be contacted by phone or by letter within the next 1-3 weeks. Call [phone]  if you have not heard about the biopsies in 3 weeks.  Please also call with any specific questions about appointments or follow up tests.

## 2017-05-13 NOTE — Progress Notes (Signed)
Blood glucose rechecked-144, d5w total intake 300 ml, d/ced. NS at [facility].

## 2017-05-13 NOTE — Op Note (Signed)
[facility] Patient Name: [name] [name] Procedure Date: 05/13/2017 10:12 AM MRN: [id number] Endoscopist: Docia Chuck. Henrene Pastor , MD Age: 71 Referring MD:  Date of Birth: [birth year] Gender: Female Account #: [id number] Procedure:                Colonoscopy, with cold snare polypectomy x 1 Indications:              Screening for colorectal malignant neoplasm.                            Previous examinations 2002 (hyperplastic polyp) and                            2007 (negative for neoplasia). Found to be anemic                            with low B12 and ferritin levels for which she is                            on B12 and iron replacement Medicines:                Monitored Anesthesia Care Procedure:                Pre-Anesthesia Assessment:                           - Prior to the procedure, a History and Physical                            was performed, and patient medications and                            allergies were reviewed. The patient's tolerance of                            previous anesthesia was also reviewed. The risks                            and benefits of the procedure and the sedation                            options and risks were discussed with the patient.                            All questions were answered, and informed consent                            was obtained. Prior Anticoagulants: The patient has                            taken no previous anticoagulant or antiplatelet                            agents. ASA Grade Assessment: II - A patient with  mild systemic disease. After reviewing the risks                            and benefits, the patient was deemed in                            satisfactory condition to undergo the procedure.                           After obtaining informed consent, the colonoscope                            was passed under direct vision. Throughout the                             procedure, the patient's blood pressure, pulse, and                            oxygen saturations were monitored continuously. The                            Colonoscope was introduced through the anus and                            advanced to the the cecum, identified by                            appendiceal orifice and ileocecal valve. The                            ileocecal valve, appendiceal orifice, and rectum                            were photographed. The quality of the bowel                            preparation was good. The colonoscopy was performed                            without difficulty. The patient tolerated the                            procedure well. The bowel preparation used was                            SUPREP. Scope In: 10:22:51 AM Scope Out: 10:42:35 AM Scope Withdrawal Time: 0 hours 15 minutes 21 seconds  Total Procedure Duration: 0 hours 19 minutes 44 seconds  Findings:                 A 5 mm polyp was found in the cecum. The polyp was                            removed with a cold snare. Resection and retrieval  were complete.                           Multiple small and large-mouthed diverticula were                            found in the left colon and right colon.                           The exam was otherwise without abnormality on                            direct and retroflexion views. Complications:            No immediate complications. Estimated blood loss:                            None. Estimated Blood Loss:     Estimated blood loss: none. Impression:               - One 5 mm polyp in the cecum, removed with a cold                            snare. Resected and retrieved.                           - Diverticulosis in the left colon and in the right                            colon.                           - The examination was otherwise normal on direct                            and retroflexion  views. Recommendation:           - Repeat colonoscopy in 5 years for surveillance,                            if polyp adenomatous. Otherwise no routine                            follow-up recommended.                           - Patient has a contact number available for                            emergencies. The signs and symptoms of potential                            delayed complications were discussed with the                            patient. Return to normal activities tomorrow.  Written discharge instructions were provided to the                            patient.                           - Resume previous diet.                           - Continue present medications.                           - Await pathology results. Docia Chuck. Henrene Pastor, MD 05/13/2017 10:53:33 AM This report has been signed electronically.

## 2017-05-13 NOTE — Progress Notes (Signed)
TO PACU  Pt awake and alert. Report ot RN

## 2017-05-13 NOTE — Progress Notes (Signed)
Called to room to assist during endoscopic procedure.  Patient ID and intended procedure confirmed with present staff. Received instructions for my participation in the procedure from the performing physician.  

## 2017-05-14 ENCOUNTER — Telehealth: Payer: Self-pay | Admitting: *Deleted

## 2017-05-14 NOTE — Telephone Encounter (Signed)
  Follow up Call-  Call back number 05/13/2017  Post procedure Call Back phone  # [phone]  Permission to leave phone message Yes  Some recent data might be hidden     Patient questions:  Do you have a fever, pain , or abdominal swelling? No. Pain Score  0 *  Have you tolerated food without any problems? Yes.    Have you been able to return to your normal activities? Yes.    Do you have any questions about your discharge instructions: Diet   No. Medications  No. Follow up visit  No.  Do you have questions or concerns about your Care? No.  Actions: * If pain score is 4 or above: No action needed, pain <4.

## 2017-05-18 ENCOUNTER — Encounter: Payer: Self-pay | Admitting: Internal Medicine

## 2017-06-07 ENCOUNTER — Other Ambulatory Visit: Payer: Self-pay | Admitting: Physical Medicine and Rehabilitation

## 2017-06-07 ENCOUNTER — Ambulatory Visit
Admission: RE | Admit: 2017-06-07 | Discharge: 2017-06-07 | Disposition: A | Discharge status: Home / Self Care | Payer: Medicare Other | Source: Ambulatory Visit | Attending: Physical Medicine and Rehabilitation | Admitting: Physical Medicine and Rehabilitation

## 2017-06-07 DIAGNOSIS — M25531 Pain in right wrist: Secondary | ICD-10-CM

## 2017-06-07 DIAGNOSIS — W19XXXA Unspecified fall, initial encounter: Secondary | ICD-10-CM

## 2017-06-08 ENCOUNTER — Ambulatory Visit (HOSPITAL_COMMUNITY)
Admission: RE | Admit: 2017-06-08 | Discharge: 2017-06-08 | Disposition: A | Discharge status: Home / Self Care | Payer: Medicare Other | Source: Ambulatory Visit | Attending: Cardiovascular Disease | Admitting: Cardiovascular Disease

## 2017-06-08 ENCOUNTER — Ambulatory Visit (INDEPENDENT_AMBULATORY_CARE_PROVIDER_SITE_OTHER): Payer: Medicare Other | Admitting: Cardiovascular Disease

## 2017-06-08 VITALS — BP 110/60 | HR 70 | Ht 68.0 in | Wt 138.8 lb

## 2017-06-08 DIAGNOSIS — I10 Essential (primary) hypertension: Secondary | ICD-10-CM | POA: Diagnosis not present

## 2017-06-08 DIAGNOSIS — E7800 Pure hypercholesterolemia, unspecified: Secondary | ICD-10-CM | POA: Diagnosis not present

## 2017-06-08 DIAGNOSIS — I739 Peripheral vascular disease, unspecified: Secondary | ICD-10-CM

## 2017-06-08 DIAGNOSIS — I700 Atherosclerosis of aorta: Secondary | ICD-10-CM | POA: Diagnosis not present

## 2017-06-08 NOTE — Progress Notes (Signed)
Cardiology Office Note   Date:  06/08/2017   ID:  [name], [name] [birth year], MRN [id number]  PCP:  Marton Redwood, MD  Cardiologist:   Dr. Stanford Breed  Chief Complaint  Patient presents with  . Follow-up      History of Present Illness: [name] [name] is a 71 y.o. female who presents  for a follow up visit regarding  peripheral arterial disease. She has known history of coronary artery disease  s/p anterior MI in 1984 treated with thrombolytics and POBA. LHC 05/11/13: Proximal LAD 30%, mid LAD 30%, EF 55-60%, apical inferior HK. Overall, her LAD was heavily calcified but she had no flow limiting stenosis.  She is status post bilateral common iliac artery kissing stents done in 2015 with for severe claudication. She has been doing well since then with no recurrent claudication.   She had worsening of anxiety after death of her husband in 30-Oct-2015 .She lost 40 pounds and could not gain anything back. She has been doing reasonably well and denies any claudication. She reports generalized aching due to arthritis. She is on oxycodone. She also reports chest discomforts described as cold air entering her lungs. She is planning to move to [place] to be close to family the near future.    Past Medical History:  Diagnosis Date  . Abnormal finding on cardiovascular stress test 04-09-2011   MID TO APICAL ANTERIOR PERFUSION DEFECT MORE PRONOUNCED AT REST THAN WITH STRESS.  NO ISCHEMIA. SUSPECT THIS REPRESENTS PRIOR MI BUT PATTERN UNUSUAL GIVEN WORSE DEFECT AT REST. APICAL HYPODINESIS BUT OVERALL PRESERVED EF.  Marland Kitchen Allergy   . Anemia   . Anginal pain (H[facility])    LAST TOOK NTG 2 DAYS AGO  . 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 Arthritis   . Cataract   . Chronic anxiety   . COPD (chronic obstructive pulmonary disease) ([facility])    Patient denies  . Coronary artery disease CARDIOLOGIST- DR Vidal Schwalbe-- VISIT 04-01-2011 W/ CHART AND IN EPIC    . Daily headache   . Depression   . Family history of anesthesia complication    "sister had PONV" (03/07/2014)  . Female bladder prolapse   . Fibromyalgia   . GERD (gastroesophageal reflux disease)   . History of echocardiogram 05-20-2007   NORMAL LVSF. DISTAL SEPTAL HYPOKINESIA. MILD LEVH W/ IMPAIRED LV RELAXATION. MILD AORTIC SCLEROSIS. MILD TRICUSPID REGURGITATION  . History of gout    30 YRS AGO  . History of hypercalcemia   . Hyperlipemia   . Hypertension   . Hypothyroidism   . Ischemic heart disease   . Lumbar herniated disc   . Migraine    "frequently; have had them since I was very young"  . Nephrolithiasis    "chronic" (03/07/2014)  . Obesity   . Osteoporosis   . PAD (peripheral artery disease) ([facility])   . Panic attack as reaction to stress   . Pneumonia   . PONV (postoperative nausea and vomiting)   . Right ureteral calculus   . Situational stress   . Type II diabetes mellitus ([facility])   . Urge urinary incontinence     Past Surgical History:  Procedure Laterality Date  . ABDOMINAL ANGIOGRAM N/A 03/07/2014   Procedure: ABDOMINAL ANGIOGRAM;  Surgeon: Wellington Hampshire, MD;  Location: [facility] CATH LAB;  Service: Cardiovascular;  Laterality: N/A;  . CARDIAC CATHETERIZATION  04-18-2008; 05-08-2004;  11-29-1986; 08-19-1988   2009 REPORT---- MILD ANTERIOR HYPOKINESIS (  FROM REMOTE MI), MILD CORONARY ATHEROSCLEROSIS W/ 30-40% NARROWING IN THE MID LAD & 50% NARROWING IN THE SMALL OBTUSE MARGINAL,  NORMAL RENAL ARTERIES W/ NO ABD. AORTIC DISEASE  . CARPAL TUNNEL RELEASE Right 2005  . CATARACT EXTRACTION W/ INTRAOCULAR LENS  IMPLANT, BILATERAL Bilateral 2013  . CORONARY ANGIOPLASTY  1984  . DILATION AND CURETTAGE OF UTERUS    . EXTRACORPOREAL SHOCK WAVE LITHOTRIPSY  2007   X2  . FACIAL COSMETIC SURGERY  ?2003; ?2007  . ILIAC ARTERY STENT Bilateral 03/07/2014  . LEFT URETEROSCOPIC LASER LITHO/ STONE EXTRACTION  12-16-2009  &  11-17-2007  . LOWER EXTREMITY ANGIOGRAM Bilateral 03/07/2014    Procedure: LOWER EXTREMITY ANGIOGRAM;  Surgeon: Wellington Hampshire, MD;  Location: [facility] CATH LAB;  Service: Cardiovascular;  Laterality: Bilateral;  . PARATHYROIDECTOMY N/A 09/26/2015   Procedure: PARATHYROIDECTOMY;  Surgeon: Armandina Gemma, MD;  Location: [facility] ORS;  Service: General;  Laterality: N/A;  . PERCUTANEOUS STENT INTERVENTION Bilateral 03/07/2014   Procedure: PERCUTANEOUS STENT INTERVENTION;  Surgeon: Wellington Hampshire, MD;  Location: [facility] CATH LAB;  Service: Cardiovascular;  Laterality: Bilateral;  bilat iliac stents  . RIGHT URETEROSCOPIC STONE EXTRACTION   07-04-2008  . TONSILLECTOMY AND ADENOIDECTOMY  1950's  . URETEROSCOPY  01/25/2012   Procedure: URETEROSCOPY;  Surgeon: Claybon Jabs, MD;  Location: [facility];  Service: Urology;  Laterality: Right;     Current Outpatient Prescriptions  Medication Sig Dispense Refill  . acetaminophen (TYLENOL) 325 MG tablet Take 650 mg by mouth every 6 (six) hours as needed for mild pain. Reported on 01/03/2016    . ALPRAZolam (XANAX) 0.5 MG tablet Take 0.5-1 mg by mouth 2 (two) times daily. Take 1 tablet in the morning and 2 tablets at night    . aspirin EC 81 MG tablet Take 81 mg by mouth daily.    Marland Kitchen atorvastatin (LIPITOR) 40 MG tablet TAKE 1 TABLET (40 MG TOTAL) BY MOUTH DAILY. 90 tablet 3  . ezetimibe (ZETIA) 10 MG tablet Take 1 tablet (10 mg total) by mouth daily. 90 tablet 2  . FLUoxetine (PROZAC) 20 MG capsule Take 20 mg by mouth daily with breakfast.     . fluticasone (FLONASE) 50 MCG/ACT nasal spray Place 1 spray into both nostrils daily.    Marland Kitchen ibuprofen (ADVIL,MOTRIN) 200 MG tablet Take 200 mg by mouth every 6 (six) hours as needed (joint pain and inflammation).     . iron polysaccharides (NIFEREX) 150 MG capsule Take 150 mg by mouth daily.    Marland Kitchen losartan (COZAAR) 25 MG tablet Take 1 tablet (25 mg total) by mouth daily. 90 tablet 2  . metFORMIN (GLUCOPHAGE) 1000 MG tablet Take 1 tablet (1,000 mg total) by mouth 2 (two) times daily.     . metoprolol succinate (TOPROL-XL) 25 MG 24 hr tablet Take 0.5 tablets (12.5 mg total) by mouth daily. 45 tablet 2  . nitroGLYCERIN (NITROSTAT) 0.4 MG SL tablet Place 1 tablet (0.4 mg total) under the tongue every 5 (five) minutes as needed for chest pain. 25 tablet 6  . Oxycodone HCl 10 MG TABS TAKE 1/2-1 TABLET BY ORAL ROUTE EVERY 8-12 HOURS, 30 DAY SUPPLY, DNF 09/20/15  0  . simethicone (MYLICON) 517 MG chewable tablet Chew 125 mg by mouth every 6 (six) hours as needed for flatulence.    Marland Kitchen SYNTHROID 112 MCG tablet Take 1 tablet by mouth daily. Take 1 tab daily    . VENTOLIN HFA 108 (90 Base) MCG/ACT inhaler Inhale 1 puff  into the lungs 2 (two) times a week. Use as needed    . Vitamin D, Ergocalciferol, (DRISDOL) 50000 UNITS CAPS capsule Take 50,000 Units by mouth once a week.  2   Current Facility-Administered Medications  Medication Dose Route Frequency Provider Last Rate Last Dose  . 0.9 %  sodium chloride infusion  500 mL Intravenous Continuous Irene Shipper, MD      . dextrose 5 % solution   Intravenous Continuous Irene Shipper, MD        Allergies:   Cephalosporins    Social History:  The patient  reports that she quit smoking about 34 years ago. Her smoking use included Cigarettes. She has a 17.00 pack-year smoking history. She has never used smokeless tobacco. She reports that she drinks alcohol. She reports that she does not use drugs.   Family History:  The patient's family history includes Esophageal cancer in her sister; Heart attack in her father; Heart disease in her brother and mother.    ROS:  Please see the history of present illness.   Otherwise, review of systems are positive for none.   All other systems are reviewed and negative.    PHYSICAL EXAM: VS:  BP 110/60   Pulse 70   Ht 5\' 8"  (1.727 m)   Wt 138 lb 12.8 oz (63 kg)   BMI 21.10 kg/m  , BMI Body mass index is 21.1 kg/m. GEN: Well nourished, well developed, in no acute distress  HEENT: normal  Neck: no  JVD, carotid bruits, or masses Cardiac: Regular rate and rhythm ; no murmurs, rubs, or gallops,no edema  Respiratory:  clear to auscultation bilaterally, normal work of breathing GI: soft, nontender, nondistended, + BS MS: no deformity or atrophy  Skin: warm and dry, no rash Neuro:  Strength and sensation are intact Psych: euthymic mood, full affect  vascular: Distal pulses are normal.   EKG:  EKG is not ordered today.  Recent Labs: No results found for requested labs within last 8760 hours.    Lipid Panel    Component Value Date/Time   CHOL 109 (L) 02/03/2016 0956   TRIG 63 02/03/2016 0956   HDL 48 02/03/2016 0956   CHOLHDL 2.3 02/03/2016 0956   VLDL 13 02/03/2016 0956   LDLCALC 48 02/03/2016 0956      Wt Readings from Last 3 Encounters:  06/08/17 138 lb 12.8 oz (63 kg)  05/13/17 144 lb (65.3 kg)  05/04/17 144 lb (65.3 kg)        ASSESSMENT AND PLAN:  1.  Peripheral arterial disease: Status post bilateral kissing stent placement to the common iliac arteries in 2015. Noninvasive vascular evaluation Today  showed patent stents with normal ABI. She has palpable distal pulses. Repeat vascular studies in one year. She has no claudication.  2. Hyperlipidemia: Currently on atorvastatin and Zetia with most recent LDL of 48.  3. Essential hypertension: Blood pressure is controlled on current medications.   Disposition:  I advised her to establish with a vascular specialist after her move to [place].  Signed, Kathlyn Sacramento, MD  06/08/2017 9:49 AM    [place]

## 2017-06-08 NOTE — Patient Instructions (Signed)
Medication Instructions:  Your physician recommends that you continue on your current medications as directed. Please refer to the Current Medication list given to you today.   Follow-Up: Your physician recommends that you schedule a follow-up appointment: AS NEEDED with Dr. Fletcher Anon.   Any Other Special Instructions Will Be Listed Below (If Applicable).  [name] [name] with your move!   If you need a refill on your cardiac medications before your next appointment, please call your pharmacy.

## 2017-07-21 ENCOUNTER — Other Ambulatory Visit: Payer: Self-pay

## 2017-07-21 MED ORDER — LOSARTAN POTASSIUM 25 MG PO TABS: 25.0000 mg | ORAL_TABLET | Freq: Every day | ORAL | 0 refills | Status: AC

## 2017-07-28 ENCOUNTER — Other Ambulatory Visit (HOSPITAL_COMMUNITY): Payer: Self-pay | Admitting: *Deleted

## 2017-07-29 ENCOUNTER — Ambulatory Visit (HOSPITAL_COMMUNITY)
Admission: RE | Admit: 2017-07-29 | Discharge: 2017-07-29 | Disposition: A | Discharge status: Home / Self Care | Payer: Medicare Other | Source: Ambulatory Visit | Attending: Internal Medicine | Admitting: Internal Medicine

## 2017-07-29 DIAGNOSIS — M810 Age-related osteoporosis without current pathological fracture: Secondary | ICD-10-CM | POA: Diagnosis present

## 2017-07-29 MED ORDER — ZOLEDRONIC ACID 5 MG/100ML IV SOLN
INTRAVENOUS | Status: AC
  Administered 2017-07-29: 09:00:00 5 mg via INTRAVENOUS
  Filled 2017-07-29: qty 100

## 2017-07-29 MED ORDER — ZOLEDRONIC ACID 5 MG/100ML IV SOLN
5.0000 mg | Freq: Once | INTRAVENOUS | Status: AC
  Administered 2017-07-29: 5 mg via INTRAVENOUS

## 2017-08-10 ENCOUNTER — Other Ambulatory Visit: Payer: Self-pay

## 2017-08-12 ENCOUNTER — Other Ambulatory Visit: Payer: Self-pay | Admitting: Cardiology

## 2018-03-01 ENCOUNTER — Other Ambulatory Visit: Payer: Self-pay | Admitting: Cardiology

## 2018-03-01 DIAGNOSIS — E785 Hyperlipidemia, unspecified: Secondary | ICD-10-CM

## 2018-05-28 ENCOUNTER — Other Ambulatory Visit: Payer: Self-pay | Admitting: Cardiology

## 2018-05-28 DIAGNOSIS — E785 Hyperlipidemia, unspecified: Secondary | ICD-10-CM

## 2020-06-27 DEATH — deceased

## 2022-06-12 ENCOUNTER — Encounter: Payer: Self-pay | Admitting: Internal Medicine
